# Patient Record
Sex: Female | Born: 1955 | Race: Black or African American | Hispanic: No | Marital: Single | Smoking: Former smoker
Health system: Southern US, Community
[De-identification: ages and names within clinical notes are randomized; demographics above are authoritative.]

## PROBLEM LIST (undated history)

## (undated) DIAGNOSIS — M3500 Sicca syndrome, unspecified: Secondary | ICD-10-CM

## (undated) DIAGNOSIS — T8859XA Other complications of anesthesia, initial encounter: Secondary | ICD-10-CM

## (undated) DIAGNOSIS — K5900 Constipation, unspecified: Secondary | ICD-10-CM

## (undated) DIAGNOSIS — IMO0001 Reserved for inherently not codable concepts without codable children: Secondary | ICD-10-CM

## (undated) DIAGNOSIS — R609 Edema, unspecified: Secondary | ICD-10-CM

## (undated) DIAGNOSIS — R1310 Dysphagia, unspecified: Secondary | ICD-10-CM

## (undated) DIAGNOSIS — M1990 Unspecified osteoarthritis, unspecified site: Secondary | ICD-10-CM

## (undated) DIAGNOSIS — K219 Gastro-esophageal reflux disease without esophagitis: Secondary | ICD-10-CM

## (undated) DIAGNOSIS — E7800 Pure hypercholesterolemia, unspecified: Secondary | ICD-10-CM

## (undated) DIAGNOSIS — E079 Disorder of thyroid, unspecified: Secondary | ICD-10-CM

## (undated) DIAGNOSIS — M549 Dorsalgia, unspecified: Secondary | ICD-10-CM

## (undated) DIAGNOSIS — I10 Essential (primary) hypertension: Secondary | ICD-10-CM

## (undated) DIAGNOSIS — E559 Vitamin D deficiency, unspecified: Secondary | ICD-10-CM

## (undated) DIAGNOSIS — E739 Lactose intolerance, unspecified: Secondary | ICD-10-CM

## (undated) DIAGNOSIS — M109 Gout, unspecified: Secondary | ICD-10-CM

## (undated) DIAGNOSIS — Z91018 Allergy to other foods: Secondary | ICD-10-CM

## (undated) DIAGNOSIS — E119 Type 2 diabetes mellitus without complications: Secondary | ICD-10-CM

## (undated) DIAGNOSIS — M2550 Pain in unspecified joint: Secondary | ICD-10-CM

## (undated) DIAGNOSIS — R519 Headache, unspecified: Secondary | ICD-10-CM

## (undated) DIAGNOSIS — R011 Cardiac murmur, unspecified: Secondary | ICD-10-CM

## (undated) DIAGNOSIS — D649 Anemia, unspecified: Secondary | ICD-10-CM

## (undated) DIAGNOSIS — B009 Herpesviral infection, unspecified: Secondary | ICD-10-CM

## (undated) DIAGNOSIS — R12 Heartburn: Secondary | ICD-10-CM

## (undated) DIAGNOSIS — R0602 Shortness of breath: Secondary | ICD-10-CM

## (undated) HISTORY — DX: Type 2 diabetes mellitus without complications: E11.9

## (undated) HISTORY — DX: Unspecified osteoarthritis, unspecified site: M19.90

## (undated) HISTORY — DX: Dorsalgia, unspecified: M54.9

## (undated) HISTORY — DX: Disorder of thyroid, unspecified: E07.9

## (undated) HISTORY — DX: Heartburn: R12

## (undated) HISTORY — PX: HERNIA REPAIR: SHX51

## (undated) HISTORY — DX: Essential (primary) hypertension: I10

## (undated) HISTORY — DX: Vitamin D deficiency, unspecified: E55.9

## (undated) HISTORY — DX: Dysphagia, unspecified: R13.10

## (undated) HISTORY — PX: TONSILLECTOMY AND ADENOIDECTOMY: SUR1326

## (undated) HISTORY — DX: Reserved for inherently not codable concepts without codable children: IMO0001

## (undated) HISTORY — DX: Edema, unspecified: R60.9

## (undated) HISTORY — PX: TUBAL LIGATION: SHX77

## (undated) HISTORY — DX: Constipation, unspecified: K59.00

## (undated) HISTORY — DX: Lactose intolerance, unspecified: E73.9

## (undated) HISTORY — DX: Shortness of breath: R06.02

## (undated) HISTORY — DX: Sjogren syndrome, unspecified: M35.00

## (undated) HISTORY — DX: Gout, unspecified: M10.9

## (undated) HISTORY — DX: Allergy to other foods: Z91.018

## (undated) HISTORY — DX: Pain in unspecified joint: M25.50

## (undated) HISTORY — DX: Herpesviral infection, unspecified: B00.9

## (undated) HISTORY — PX: PELVIC LAPAROSCOPY: SHX162

## (undated) HISTORY — DX: Anemia, unspecified: D64.9

## (undated) HISTORY — PX: BREAST SURGERY: SHX581

## (undated) HISTORY — PX: BREAST LUMPECTOMY: SHX2

## (undated) HISTORY — PX: NECK SURGERY: SHX720

## (undated) HISTORY — DX: Pure hypercholesterolemia, unspecified: E78.00

## (undated) HISTORY — PX: APPENDECTOMY: SHX54

## (undated) HISTORY — DX: Gastro-esophageal reflux disease without esophagitis: K21.9

## (undated) HISTORY — PX: PILONIDAL CYST EXCISION: SHX744

---

## 1998-08-28 ENCOUNTER — Other Ambulatory Visit: Admission: RE | Admit: 1998-08-28 | Discharge: 1998-08-28 | Payer: Self-pay | Admitting: Gynecology

## 2000-10-20 ENCOUNTER — Encounter: Admission: RE | Admit: 2000-10-20 | Discharge: 2000-10-20 | Payer: Self-pay | Admitting: Internal Medicine

## 2000-10-20 ENCOUNTER — Encounter: Payer: Self-pay | Admitting: Internal Medicine

## 2001-01-05 ENCOUNTER — Ambulatory Visit (HOSPITAL_COMMUNITY): Admission: RE | Admit: 2001-01-05 | Discharge: 2001-01-06 | Payer: Self-pay | Admitting: Neurosurgery

## 2001-02-13 ENCOUNTER — Encounter: Admission: RE | Admit: 2001-02-13 | Discharge: 2001-02-13 | Payer: Self-pay | Admitting: Neurosurgery

## 2002-04-16 ENCOUNTER — Other Ambulatory Visit: Admission: RE | Admit: 2002-04-16 | Discharge: 2002-04-16 | Payer: Self-pay | Admitting: Gynecology

## 2002-10-11 HISTORY — PX: SALPINGOOPHORECTOMY: SHX82

## 2002-10-29 ENCOUNTER — Encounter (INDEPENDENT_AMBULATORY_CARE_PROVIDER_SITE_OTHER): Payer: Self-pay

## 2002-10-29 ENCOUNTER — Observation Stay (HOSPITAL_COMMUNITY): Admission: RE | Admit: 2002-10-29 | Discharge: 2002-10-30 | Payer: Self-pay | Admitting: Gynecology

## 2003-06-25 ENCOUNTER — Other Ambulatory Visit: Admission: RE | Admit: 2003-06-25 | Discharge: 2003-06-25 | Payer: Self-pay | Admitting: Gynecology

## 2004-08-04 ENCOUNTER — Other Ambulatory Visit: Admission: RE | Admit: 2004-08-04 | Discharge: 2004-08-04 | Payer: Self-pay | Admitting: Gynecology

## 2005-01-10 HISTORY — PX: VAGINAL HYSTERECTOMY: SUR661

## 2005-01-11 ENCOUNTER — Encounter (INDEPENDENT_AMBULATORY_CARE_PROVIDER_SITE_OTHER): Payer: Self-pay | Admitting: Specialist

## 2005-01-11 ENCOUNTER — Observation Stay (HOSPITAL_COMMUNITY): Admission: RE | Admit: 2005-01-11 | Discharge: 2005-01-12 | Payer: Self-pay | Admitting: Gynecology

## 2005-01-23 ENCOUNTER — Inpatient Hospital Stay (HOSPITAL_COMMUNITY): Admission: AD | Admit: 2005-01-23 | Discharge: 2005-01-26 | Payer: Self-pay | Admitting: Gynecology

## 2005-04-12 HISTORY — PX: SALPINGOOPHORECTOMY: SHX82

## 2005-04-16 ENCOUNTER — Ambulatory Visit: Payer: Self-pay | Admitting: Infectious Diseases

## 2005-04-16 ENCOUNTER — Inpatient Hospital Stay (HOSPITAL_COMMUNITY): Admission: AD | Admit: 2005-04-16 | Discharge: 2005-04-23 | Payer: Self-pay | Admitting: Gynecology

## 2005-04-18 ENCOUNTER — Encounter: Payer: Self-pay | Admitting: Gynecology

## 2005-05-03 ENCOUNTER — Inpatient Hospital Stay (HOSPITAL_COMMUNITY): Admission: RE | Admit: 2005-05-03 | Discharge: 2005-05-07 | Payer: Self-pay | Admitting: Gynecology

## 2005-05-03 ENCOUNTER — Encounter (INDEPENDENT_AMBULATORY_CARE_PROVIDER_SITE_OTHER): Payer: Self-pay | Admitting: *Deleted

## 2005-08-16 ENCOUNTER — Other Ambulatory Visit: Admission: RE | Admit: 2005-08-16 | Discharge: 2005-08-16 | Payer: Self-pay | Admitting: Gynecology

## 2006-08-17 ENCOUNTER — Other Ambulatory Visit: Admission: RE | Admit: 2006-08-17 | Discharge: 2006-08-17 | Payer: Self-pay | Admitting: Gynecology

## 2007-03-16 ENCOUNTER — Encounter: Admission: RE | Admit: 2007-03-16 | Discharge: 2007-03-27 | Payer: Self-pay | Admitting: Internal Medicine

## 2007-10-12 ENCOUNTER — Other Ambulatory Visit: Admission: RE | Admit: 2007-10-12 | Discharge: 2007-10-12 | Payer: Self-pay | Admitting: Gynecology

## 2008-03-18 ENCOUNTER — Ambulatory Visit: Payer: Self-pay | Admitting: Gynecology

## 2008-07-03 ENCOUNTER — Encounter: Admission: RE | Admit: 2008-07-03 | Discharge: 2008-07-03 | Payer: Self-pay | Admitting: Internal Medicine

## 2008-08-20 ENCOUNTER — Ambulatory Visit: Payer: Self-pay | Admitting: Gynecology

## 2009-03-17 ENCOUNTER — Observation Stay (HOSPITAL_COMMUNITY): Admission: EM | Admit: 2009-03-17 | Discharge: 2009-03-18 | Payer: Self-pay | Admitting: Emergency Medicine

## 2009-04-12 DIAGNOSIS — B009 Herpesviral infection, unspecified: Secondary | ICD-10-CM

## 2009-04-12 HISTORY — DX: Herpesviral infection, unspecified: B00.9

## 2009-05-02 ENCOUNTER — Other Ambulatory Visit: Admission: RE | Admit: 2009-05-02 | Discharge: 2009-05-02 | Payer: Self-pay | Admitting: Gynecology

## 2009-05-02 ENCOUNTER — Ambulatory Visit: Payer: Self-pay | Admitting: Gynecology

## 2009-08-18 ENCOUNTER — Ambulatory Visit: Payer: Self-pay | Admitting: Gynecology

## 2009-09-16 ENCOUNTER — Ambulatory Visit: Payer: Self-pay | Admitting: Gynecology

## 2009-09-19 ENCOUNTER — Ambulatory Visit: Payer: Self-pay | Admitting: Gynecology

## 2010-03-12 HISTORY — PX: BACK SURGERY: SHX140

## 2010-04-20 ENCOUNTER — Ambulatory Visit
Admission: RE | Admit: 2010-04-20 | Discharge: 2010-04-20 | Payer: Self-pay | Source: Home / Self Care | Attending: Gynecology | Admitting: Gynecology

## 2010-05-18 ENCOUNTER — Other Ambulatory Visit: Payer: Self-pay | Admitting: Internal Medicine

## 2010-05-19 ENCOUNTER — Encounter: Payer: Self-pay | Admitting: Internal Medicine

## 2010-07-14 LAB — CARDIAC PANEL(CRET KIN+CKTOT+MB+TROPI)
CK, MB: 0.9 ng/mL (ref 0.3–4.0)
CK, MB: 1 ng/mL (ref 0.3–4.0)
Relative Index: 0.7 (ref 0.0–2.5)
Total CK: 131 U/L (ref 7–177)
Total CK: 146 U/L (ref 7–177)
Troponin I: 0.02 ng/mL (ref 0.00–0.06)

## 2010-07-14 LAB — POCT I-STAT, CHEM 8
Creatinine, Ser: 1 mg/dL (ref 0.4–1.2)
HCT: 43 % (ref 36.0–46.0)
Hemoglobin: 14.6 g/dL (ref 12.0–15.0)
Potassium: 3.9 mEq/L (ref 3.5–5.1)
Sodium: 141 mEq/L (ref 135–145)

## 2010-07-14 LAB — CBC
MCHC: 33.2 g/dL (ref 30.0–36.0)
MCV: 81.1 fL (ref 78.0–100.0)
MCV: 81.2 fL (ref 78.0–100.0)
Platelets: 222 10*3/uL (ref 150–400)
RDW: 14.4 % (ref 11.5–15.5)
RDW: 14.6 % (ref 11.5–15.5)
WBC: 5.4 10*3/uL (ref 4.0–10.5)

## 2010-07-14 LAB — POCT CARDIAC MARKERS
CKMB, poc: 1.3 ng/mL (ref 1.0–8.0)
Myoglobin, poc: 91.1 ng/mL (ref 12–200)

## 2010-07-14 LAB — DIFFERENTIAL
Basophils Absolute: 0 10*3/uL (ref 0.0–0.1)
Basophils Relative: 0 % (ref 0–1)
Eosinophils Absolute: 0.1 10*3/uL (ref 0.0–0.7)
Lymphs Abs: 1.8 10*3/uL (ref 0.7–4.0)
Neutrophils Relative %: 57 % (ref 43–77)

## 2010-07-14 LAB — MAGNESIUM: Magnesium: 1.9 mg/dL (ref 1.5–2.5)

## 2010-07-14 LAB — PHOSPHORUS: Phosphorus: 4 mg/dL (ref 2.3–4.6)

## 2010-07-14 LAB — BASIC METABOLIC PANEL
Calcium: 8.7 mg/dL (ref 8.4–10.5)
Creatinine, Ser: 0.89 mg/dL (ref 0.4–1.2)
GFR calc Af Amer: >60 mL/min (ref >60)
GFR calc non Af Amer: >60 mL/min (ref >60)
Sodium: 142 mEq/L (ref 135–145)

## 2010-08-28 NOTE — H&P (Signed)
NAME:  [name], [name]                  ACCOUNT NO.:  [id number]   MEDICAL RECORD NO.:  [id number]                   PATIENT TYPE:  AMB   LOCATION:  SDC                                  FACILITY:  [facility]   PHYSICIAN:  Timothy P. Fontaine, M.D.           DATE OF BIRTH:  12-25-1955   DATE OF ADMISSION:  10/29/2002  DATE OF DISCHARGE:                                HISTORY & PHYSICAL   CHIEF COMPLAINT:  Left-sided pain.   HISTORY OF PRESENT ILLNESS:  A 56 year old, G5, P2, AB3, female with a long  history of recurrent left pelvic pain.  The patient is status post  laparoscopy x2 for similar pain, and on ultrasound she is found to have a  persistent cystic change, questionable hydrosalpinx versus persistent  ovarian cyst.  Her CA-125 was normal.  Options for expectant management  versus surgical intervention were reviewed, and the patient desires surgery.  The patient is status post tubal sterilization in the past.  The patient  plans for left salpingo-oophorectomy.   PAST MEDICAL HISTORY:  None.   PAST SURGICAL HISTORY:  1. Groin hernia repair.  2. Tonsillectomy.  3. Breast biopsy.  4. Tubal sterilization.  5. Laparoscopy x2.   MEDICATIONS:  1. Zyrtec.  2. Nasacort.  3. Albuterol p.r.n.   ALLERGIES:  1. PENICILLIN.  2. CODEINE.   FAMILY HISTORY:  Noncontributory.   SOCIAL HISTORY:  Noncontributory.   REVIEW OF SYSTEMS:  Noncontributory.   ADMISSION PHYSICAL EXAMINATION:  VITAL SIGNS:  Afebrile.  Vital signs are  stable.  HEENT:  Normal.  LUNGS:  Clear.  CARDIAC:  Regular rate with rubs, murmurs, or gallops.  ABDOMEN:  Benign.  PELVIC:  External BUS, vagina normal.  Cervix grossly normal.  Uterus normal  size, nontender.  Adnexa without masses or tenderness.   ASSESSMENT:  A 55 year old, G5, P2, AB3, female status post tubal  sterilization with recurrent chronic left-sided pain.  Ultrasound suggestive  of a persistent ovarian cyst versus hydrosalpinx.  The  patient is admitted  for laparoscopic left salpingo-oophorectomy.  I reviewed with the patient  the various scenarios.  I discussed with her that if at the time of  laparoscopy what if her left side appears normal or that she has a left  hydrosalpinx and her ovary appears normal, and the patient stated that she  wants the left tube and ovary removed regardless, even if it appears normal.  We also discussed that there is no guarantee as far as pain relief is  concerned, that she may still have pain following the procedure.  She may  develop new pain or worsening pain, and again she understands and accepts  this and wants to proceed with surgery.  I also discussed with her that if  there is scarring or pathology that I am not comfortable laparoscopically  pursuing, the options for terminating the procedure then or proceeding with  an exploratory laparotomy was discussed, and she wants the exploratory  laparotomy to remove the left side.  We also discussed the issues of  hysterectomy, bilateral salpingo-oophorectomy, and she prefers the  conservative left salpingo-oophorectomy approach now, understanding that  again there are no guarantees, and we may ultimately have to proceed with  hysterectomy, bilateral salpingo-oophorectomy in the future if her pain  persists or recurs.  I discussed what is involved with the procedure to  include the expected intraoperative and postoperative courses including  insufflation, trocar placement, multiple port sites, and large incisions to  remove cystic ovary or hydrosalpinx.  The use of sharp blunt dissection,  electrocautery, staples, and surgical clips were all discussed with her.  The risk of inadvertent injury to internal orders including bowel, bladder,  ureters, vessels and nerves necessitating major exploratory reparative  surgeries and future reparative surgeries including ostomy formation was all  discussed, understood, and accepted.  She  understands that these  complications may be immediately recognized or delay recognized.  The risks  of hemorrhage necessitating transfusion, and the risk of transfusion  including transfusion reaction, hepatitis, HIV, mad cow disease, and other  unknown entities were discussed with her.  The risks of infection leading to  abscess formation, reoperation for abscess drainage, as well as prolonged  antibiotics were discussed with her, and incisional complications requiring  opening and draining of incisions were also reviewed with her.  The  patient's questions were answered to her satisfaction, and she was ready to  proceed with surgery.                                               Timothy P. Audie Box, M.D.    TPF/MEDQ  D:  10/28/2002  T:  10/28/2002  Job:  [id number]

## 2010-08-28 NOTE — H&P (Signed)
[name], [name]                ACCOUNT NO.:  [id number]   MEDICAL RECORD NO.:  [id number]          PATIENT TYPE:  INP   LOCATION:  NA                           FACILITY:  [facility]   PHYSICIAN:  Timothy P. Fontaine, M.D.DATE OF BIRTH:  1956-01-29   DATE OF ADMISSION:  05/03/2005  DATE OF DISCHARGE:                                HISTORY & PHYSICAL   CHIEF COMPLAINT:  Pelvic mass.   HISTORY OF PRESENT ILLNESS:  A 55 year old female status post TVH,  posterior colporrhaphy January 11, 2005. The patient's postoperative course  was complicated by a presumptive postoperative cuff cellulitis, for which  she was admitted January 23, 2005 through January 26, 2005 and received IV  antibiotics. The patient presented with fevers and pelvic pain. She had an  ultrasound which showed no pelvic masses, accumulations, hematomas, and  bilateral ureters were normal with bilateral ureteral jets in the bladder.  She did well postoperatively, had a normal postoperative follow up exam  until proximally 2 weeks prior to this admission, at which time she  developed increasing fevers at home to 102-103 and pelvic pain, pelvic  pressure. Evaluation at that time found a solid cystic mass at the cuff  measuring 57 x 48 x 50 mm, continuous with the right ovary. She also had  right ureteral compression with right-sided hydronephrosis. She was admitted  at that time. Differential to include a hemorrhagic ovarian process,  compression of the ureter, possible upper tract urinary infection versus  TOA. The patient defervesced with IV antibiotics, was switched to oral  Bactrim, remained afebrile, and is readmitted at this time for exploratory  laparotomy, and excision of her pelvic cystic mass to include right salpingo-  oophorectomy. The patient also will have a ureteral stent placed on the  right to assist during the procedure.   PAST MEDICAL HISTORY:  1.  Asthma.  2.  Reflux.   CURRENT MEDICATIONS:  1.   Prilosec.  2.  Zyrtec.  3.  Albuterol inhaler p.r.n.  4.  Advair inhaler p.r.n.  5.  Bactrim DS one p.o. b.i.d.   ALLERGIES:  Allergies are significant to include -  1.  PENICILLIN and PENICILLIN DERIVATIVES.  2.  'MYCIN ANTIBIOTICS.  3.  CODEINE.  4.  HYDROCODONE DERIVATIVES.  5.  IODINE.  6.  SHELLFISH.  7.  PEANUTS.  8.  LATEX.   PAST SURGICAL HISTORY:  1.  Vaginal hysterectomy, posterior repair.  2.  Separate left salpingo-oophorectomy for serous cystadenoma.  3.  Tonsillectomy and adenoidectomy.  4.  Breast biopsy.  5.  Tubal sterilization.  6.  Laparoscopy x2.  7.  Pilonidal cyst.   FAMILY HISTORY:  Noncontributory.   SOCIAL HISTORY:  Noncontributory.   PHYSICAL EXAMINATION:  VITAL SIGNS:  Afebrile. Vital signs are stable.  HEENT:  Normal.  LUNGS:  Clear.  CARDIAC:  Regular rate without rubs, murmurs or gallops.  ABDOMEN:  Benign, noting lower abdominopelvic discomfort to deep palpation.  No acute changes such as rebound, guarding.  PELVIC:  External Bartholin's urethra, Skene's glands, vagina normal.  Bimanual palpable mass at the cuff approximately  7 cm, mildly tender.   ASSESSMENT/PLAN:  A 55 year old female status post vaginal hysterectomy and  posterior repair, cuff cellulitis, treated.  Apparently did well until 2  weeks prior to admission with onset of fevers, pelvic pain and pressure.  Ultrasound, MRI consistent with a cystic complex mass at the cuff continuous  with the right ovary, right hydronephrosis due to extrinsic compression.  Differential includes a tubo-ovarian abscess, hemorrhagic ovarian process  with extrinsic ureteral compression, upper urinary tract infection. The  patient is admitted for exploratory laparotomy, right salpingo-oophorectomy,  excision of mass. Risks, benefits, indications and alternatives for the  procedure were reviewed with the patient to include the expected  intraoperative and postoperative courses. She understands that  Dr.  Patsi Sears and I will perform the surgery and it may involve other  consultants such as general surgery, if needed intraoperatively. Ureteral  stent will tentatively be placed to assist with the surgery and probably  will be left in place to very removed at some point postoperatively.  Realistic issues were reviewed with her.  If this is indeed a tubo-ovarian  abscess, the potential for significant adhesive disease involving the bowel  and pelvic sidewall were discussed. She wants to have her right ovary  removed, but she understands that if there is intense inflammatory reaction  that we may not be able to remove all of her ovary, and that she may have an  ovarian remnant syndrome were portions of the ovary remain, which could  remain functional, such that she would have issues in the future with  continued hormone production, as well as a potential for problems such as  both benign and malignant ovarian disease. She understands that we will  attempt at our best effort to remove all the ovary, but, again, this may not  be possible. She also understands realistically that the bowels may be  inflamed and involved in this adhesive area and that realistically we may  enter the intestines both small and large, requiring closure of the  intestines and/or the potential for colostomy or other ostomy formations  that she would have to undergo further surgeries to complete repairs in the  future. The risk of infection following the procedure was reviewed. She will  be on prophylactic antibiotics, but she may need to continue on these  following the procedure. Wound complications following the procedure were  discussed, and the possibility of open and drainage of wound seromas,  hematomas, or abscess formations and closure by secondary intention was  discussed, understood and accepted. The risks of bleeding and hemorrhage during the procedure were reviewed, and the potential for transfusion   discussed.  The risks of transfusion reaction, hepatitis, HIV, mad cow  disease and other unknown entities were all reviewed, understood and  accepted. The risks of injury to internal organs including bowel, bladder,  ureters, vessels and nerves necessitating major exploratory reparative  surgeries, future reparative surgeries to include ostomies as previously  mentioned were all discussed, understood and accepted. The patient's  questions were answered to her satisfaction, and she is ready to proceed  with surgery. She will be on a bowel prep, receive prophylactic antibiotics  and intermittent compression hose for DVT prophylaxis.      Timothy P. Fontaine, M.D.  Electronically Signed     TPF/MEDQ  D:  04/29/2005  T:  04/29/2005  Job:  [id number]

## 2010-08-28 NOTE — Discharge Summary (Signed)
NAMESRUTHI, MAURER                ACCOUNT NO.:  [id number]   MEDICAL RECORD NO.:  [id number]          PATIENT TYPE:  INP   LOCATION:  9306                          FACILITY:  [facility]   PHYSICIAN:  Timothy P. Fontaine, M.D.DATE OF BIRTH:  [birth year]   DATE OF ADMISSION:  01/23/2005  DATE OF DISCHARGE:  01/26/2005                                 DISCHARGE SUMMARY   DISCHARGE DIAGNOSIS:  Postoperative cuff cellulitis.   PROCEDURE:  None.   HOSPITAL COURSE:  A 55 year old female status post TVH one week prior to  admission with increasing lower abdominal discomfort, nausea and fever.  Admission physical exam showed a temperature of 101, subsequently rose to  103 with a physical exam showing pelvic tenderness.  No palpable masses and  no other abnormalities on physical exam.  The patient had an ultrasound as  an outpatient, pelvic, which showed no cuff hematoma or accumulation and  upon admission to the hospital had a renal ultrasound to rule out  hydronephrosis, and both the kidneys and ureters were visualized and were  normal, as well as seeing ureteral jets bilaterally into the urinary  bladder.  The patient was begun on IV antibiotics to include gentamicin and  clindamycin and defervesced quickly.  At the time of discharge she was  ambulating well, tolerating a regular diet.  She was afebrile greater than  24 hours and was given a prescription for clindamycin 300 mg q.i.d. to  complete a seven-day course.  She has a number allergies to include  PENICILLIN, which dictated her IV antibiotic choice.  She was also given a  prescription for Demerol 50 mg #20 one p.o. q.4-6h. p.r.n. pain.  She  received precautions for ASAP precautions and will be seen in the office the  week following discharge.      Timothy P. Fontaine, M.D.  Electronically Signed     TPF/MEDQ  D:  02/16/2005  T:  02/16/2005  Job:  [id number]

## 2010-08-28 NOTE — Op Note (Signed)
NAMEVINCENZA, [name]                ACCOUNT NO.:  [id number]   MEDICAL RECORD NO.:  [id number]          PATIENT TYPE:  INP   LOCATION:  1426                         FACILITY:  [facility]   PHYSICIAN:  Timothy P. Fontaine, M.D.DATE OF BIRTH:  [birth year]   DATE OF PROCEDURE:  05/03/2005  DATE OF DISCHARGE:                                 OPERATIVE REPORT   PREOPERATIVE DIAGNOSES:  Right hydronephrosis, pelvic mass.   POSTOPERATIVE DIAGNOSES:  1.  Right hydronephrosis.  2.  Right tubal ovarian abscess.  3.  Appendiceal inflammation.   PROCEDURE:  Cystoscopy, right ureteral stent placement, exploratory  laparotomy, lysis of adhesions, right salpingo-oophorectomy, TOA excision,  appendectomy.   SURGEON:  Timothy P. Fontaine, M.D.   ASSISTANTLynelle Smoke I. Patsi Sears, M.D.   ANESTHETIC:  General.   COMPLICATIONS:  None.   SPECIMEN:  Right TOA, appendix.   ESTIMATED BLOOD LOSS:  200-300 mL.   FINDINGS:  Right 7 cm cystic mass with pus extruding consistent with TOA  removed, appendix adherent to the mass, loops of small intestine adherent to  the mass.   DESCRIPTION OF PROCEDURE:  The patient was taken to the operating room,  properly identified, underwent general anesthesia and was placed in the low  dorsal lithotomy position. She received a perineal preparation with Betadine  solution draped in the usual fashion and underwent cystoscopy per Dr.  Patsi Sears with a right ureteral stent placement without difficulty per his  separate dictation. Subsequently, the Foley catheter was placed, the patient  placed in the supine position, received an abdominal preparation with  Betadine solution and was draped in the usual fashion. The abdomen was  sharply entered through a midline lower abdominal incision without  difficulty. The O'Connor-O'Sullivan retractor was placed, bladder blade  placed and the intestines were packed from the operative site. A cystic  right pelvic sidewall mass was  encountered consistent with the TOA. The  appendix was adherent across the anterior surface of this area and this was  sharply bluntly dissected free. Subsequently several loops of small  intestine were similarly sharply and bluntly dissected free of the mass  without evidence of perforation. The mass was freed from the pelvic sidewall  through blunt dissection and a small area of pus extruded which was cultured  and sent to microbiology, both aerobic and anaerobic cultures. The right  round ligament was identified, doubly ligated and transected and the  peritoneal sidewall reflection was sharply incised. Due to the intense  inflammation, the infundibulopelvic region was identified and through  progressive right-angle tunneling and subsequent ligation transection, the  infundibulopelvic ligament and vessels were transected and ligated in  several passes. The ureter was away from this site. The tubo-ovarian abscess  was ultimately delivered and excised and of note, the abscess was not  ruptured during this process. The pelvis was copiously irrigated. Several  small bleeding points addressed with electrocautery. Ultimately Surgicel was  placed into the cul-de-sac to achieve ultimate hemostasis. Dr. Zachery Dakins was  summoned to the operating room for his assessment of the appendix as to  whether we should remove this  and under his direction it was recommended to  go ahead and perform an appendectomy due to the inflammatory nature of the  appendix. The mesoappendix was ligated using #0 Vicryl suture and  subsequently using a TA-30 stapling device. The appendix was stapled at the  intersection with the cecum and the appendix was excised. The stump was  noted to be intact. The bowel packing was then removed, the retractors  removed. Instrument count correct, sponge count correct and the anterior  fascia was reapproximated using #0 PDS in a running suture. Subcutaneous  tissues were irrigated and  the skin was subsequently reapproximated with  staples. A sterile dressing was applied, the patient awakened without  difficulty and taken to the recovery in good condition having tolerated the  procedure well.      Timothy P. Fontaine, M.D.  Electronically Signed     TPF/MEDQ  D:  05/03/2005  T:  05/04/2005  Job:  [id number]

## 2010-08-28 NOTE — Discharge Summary (Signed)
NAME:  [name], [name]                ACCOUNT NO.:  [id number]   MEDICAL RECORD NO.:  [id number]          PATIENT TYPE:  OBV   LOCATION:  9306                          FACILITY:  [facility]   PHYSICIAN:  Timothy P. Fontaine, M.D.DATE OF BIRTH:  1955/11/07   DATE OF ADMISSION:  01/11/2005  DATE OF DISCHARGE:                                 DISCHARGE SUMMARY   DISCHARGE DIAGNOSES:  1.  Uterine prolapse.  2.  Rectocele.  3.  Leiomyomata.   PROCEDURE:  Total vaginal hysterectomy, posterior colporrhaphy, McCall's  culdoplasty - January 11, 2005; pathology pending.   HOSPITAL COURSE:  A 55 year old female with symptomatic uterine prolapse,  posterior rectocele, underwent total vaginal hysterectomy, posterior  colporrhaphy, McCall's culdoplasty on January 11, 2005 without complications.  The patient was discharged the first morning postoperative, ambulating well,  tolerating a regular diet, with a postoperative hemoglobin of 12.2. The  patient received precautions, instructions, and follow-up. Prescription for  Demerol 50 mg #20 one p.o. q.4-6h. p.r.n. pain, and will be seen in the  office 2 weeks following discharge.      Timothy P. Fontaine, M.D.  Electronically Signed     TPF/MEDQ  D:  01/12/2005  T:  01/12/2005  Job:  [id number]

## 2010-08-28 NOTE — Discharge Summary (Signed)
[name], PARZYCH                ACCOUNT NO.:  [id number]   MEDICAL RECORD NO.:  [id number]          PATIENT TYPE:  INP   LOCATION:  9317                          FACILITY:  [facility]   PHYSICIAN:  Timothy P. Fontaine, M.D.DATE OF BIRTH:  02/14/1956   DATE OF ADMISSION:  04/16/2005  DATE OF DISCHARGE:  04/23/2005                                 DISCHARGE SUMMARY   DISCHARGE DIAGNOSES:  1.  Tubo-ovarian abscess.  2.  Right hydronephrosis.   PROCEDURE:  None.   HOSPITAL COURSE:  A 54 year old female status post total vaginal  hysterectomy, posterior repair in October. The patient initially had a  febrile episode diagnosed with cuff cellulitis, received IV antibiotics with  resolution of her symptoms. She subsequently was for postoperative check and  doing well. Of note during that evaluation, she had a negative pelvic  ultrasound and normal-appearing renal collecting systems with bilateral  ureteral jets noted on ultrasound. She did well until about a week prior to  admission at one point she had increasing pelvic pressure and pain, right-  sided back pain and spiking fevers to 102 to 103. Initial evaluation  revealed temperature 101, tenderness in the lower abdomen with right CVA  tenderness and vessel cuff tender to palpation. She had an elevated white  count at 20,000 and a hemoglobin of 11.4. Evaluations included ultrasound  and MRI, the reports of which are not in the chart at the time of this  dictation but which showed a pelvic mass, complex approximately 9 cm,  contiguous with the right ovary, consistent with tubo-ovarian process and  right hydronephrosis, although there is some decompression post void noted.  Working diagnosis was tubo-ovarian abscess with compression of right ureter  versus hemorrhagic benign process of the right ovary compression of the  ureter with pyelonephritis.   The patient's has a number of allergies and she underwent an infectious  disease consult  for assistance in selection of antibiotics as well as a  urologic consultation in reference to her right hydronephrosis. The patient  was placed on vancomycin 1500 milligrams q.12h. and aztreonam  1 gram q.8h.  She defervesced and clinically improved over her hospitalization. The follow-  up white count was 9.8. Blood cultures and urine cultures obtained at the  time of admission were negative in follow-up. The patient remained afebrile.  Greater than 48 hours prior to discharge was changed to Bactrim DS for oral  antibiotics per infectious disease recommendation and the plan is to  continue on oral antibiotics for 1-2 weeks and subsequently then undergo  surgical exploration for removal of the mass as well as a preoperative  ureteral stent placement to aid in surgical dissection. The patient received  precautions, instructions and follow-up. She was to follow up within the  week of discharge in the office. ASAP call precautions stressed. She was  given a prescription for Bactrim DS one p.o. b.i.d., Demerol 50 milligrams  #30 one to two p.o. q.4-6h. p.r.n. pain and Reglan 10 milligrams #30 one  p.o. q.6h. p.r.n. nausea.      Timothy P. Fontaine, M.D.  Electronically  Signed     TPF/MEDQ  D:  05/08/2005  T:  05/08/2005  Job:  [id number]

## 2010-08-28 NOTE — Consult Note (Signed)
[name], [name]                ACCOUNT NO.:  [id number]   MEDICAL RECORD NO.:  [id number]          PATIENT TYPE:  INP   LOCATION:  9317                          FACILITY:  [facility]   PHYSICIAN:  Sigmund I. Patsi Sears, M.D.DATE OF BIRTH:  1955/10/22   DATE OF CONSULTATION:  04/19/2005  DATE OF DISCHARGE:                                   CONSULTATION   SUBJECTIVE:  The patient is a 55 year old female, 2-1/2 months status post  vaginal hysterectomy, now found to have a 10cm multiloculated fluid mass (?  tubo-ovarian abscess) on MRI.  She is currently being treated with IV  antibiotics.  The patient is noted to have mild to moderate right  hydronephrosis, coincidental with flank pain.  ROS> No flankpain, no gross hematuria. Remaining ROS non-contributory.   PAST MEDICAL HISTORY:  1.  Transvaginal hysterectomy with posterior repair in October 2006.  2.  Asthma.  3.  GERD.  <Family History> Non-Contributory  <Social History> married. not working.   MEDICATIONS:  1.  Prilosec one p.o. per day.  2.  Zyrtec one p.o. per day.  3.  Albuterol inhaler.  4.  Advair inhaler.   ALLERGIES:  1.  PENICILLIN (swelling and rash and hives).  2.  MYCINS.  3.  CODEINE.  4.  IODINE.  5.  SHELLFISH.  6.  PEANUTS.  7.  LATEX.   PHYSICAL EXAMINATION:  GENERAL:  Well-developed black female in no acute  distress.  VITAL SIGNS:  Blood pressure 97/61, temperature fluctuating between 98.6 and  100.5.  Pulse 80.  NECK: Supple, non-tender, no nodes.  CHEST: clear to P and A.  ABDOMEN:  Soft and scaphoid.  There is distention in the suprapubic area.  There is a massive egg in the suprapubic area. There is tenderness to  palpation and the area is hot to palpation.  There is pain in both the left  lower quadrant and the right lower quadrant.  EXTRIMITIES: Warm. Full pulses. No edema.   MRI review shows a incidental right mid, mild to moderate, hydronephrosis  and the ureter seems to be on the periphery  of the 10 cm multiloculated  cystic mass, which is located in the midline suprapubic area.   I have discussed the case with Dr. Audie Box and also with the patient.  I  agree that she will need to have a double-J catheter placement, and will  proceed with this when she has her exploratory surgery.   Will follow with you.      Sigmund I. Patsi Sears, M.D.  Electronically Signed     SIT/MEDQ  D:  04/19/2005  T:  04/20/2005  Job:  [id number]   cc:   Marcial Pacas P. Fontaine, M.D.  Fax: [phone]

## 2010-08-28 NOTE — H&P (Signed)
[place]. [facility]  Patient:    [name], CREDIT Visit Number: [id number] MRN: [id number]          Service Type: SUR Location: [facility] 3172 07 Attending Physician:  Cristi Loron Dictated by:   Cristi Loron, M.D. Admit Date:  01/05/2001                           History and Physical  CHIEF COMPLAINT:  Neck pain and right shoulder and arm pain.  HISTORY OF PRESENT ILLNESS:  The patient is a 55 year old black female who has had at least one year of neck and right shoulder and arm pain.  She failed medical management.  She was worked up with cervical x-rays and a cervical MRI.  She was kindly sent for my consultation.  The patient complains of posterior cervical pain at the cervical ______ junction, radiating into the right trapezius, right should, and down the right arm.  She has numbness and tingling in the C6 distribution on the right.  PAST MEDICAL HISTORY:  Positive for benign breast cyst, pilonidal cyst, inguinal hernias, and remote history of tonsillitis.  PAST SURGICAL HISTORY:  Left breast lumpectomy, pilonidal cyst removal, bilateral herniorrhaphy, tubal ligation, and tonsillectomy.  MEDICATIONS PRIOR TO ADMISSION:  Allegra p.r.n. and Advil.  DRUG ALLERGIES:  PENICILLIN, TEQUIN, and DURATUSS.  FAMILY MEDICAL HISTORY:  The patients mother is age 54 and she has rheumatoid arthritis.  The patients father is age 64 with heart disease.  SOCIAL HISTORY:  The patient is married.  She has two children.  [place]  She denies tobacco, ethanol, and drug use.  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  REVIEW OF SYSTEMS:  Negative, except as above.  PHYSICAL EXAMINATION:  GENERAL APPEARANCE:  A pleasant, well-nourished, well-developed, black female in no apparent distress.  HEIGHT:  5 feet 7 inches.  WEIGHT:  198 pounds.  HEENT:  Normal.  NECK:  Supple.  There are no masses,  deformity, tracheal deviation, jugular venous distention, or carotid bruits.  She has a mildly limited cervical range of motion.  Spurling test is positive on the right and negative on the left. Lhermittes sign was not present.  THORAX:  Symmetric.  LUNGS:  Clear to auscultation.  HEART:  Regular rate and rhythm.  ABDOMEN:  Soft and nontender.  EXTREMITIES:  No obvious deformities.  BACK:  Exam was normal.  NEUROLOGIC:  The patient is alert and oriented x 3.  Cranial nerves II-XII are grossly intact bilaterally.  Vision and hearing are grossly normal bilaterally.  Motor strength is 5/5 in the bilateral deltoids, biceps, wrist extensors, psoas, quadriceps, gastrocnemius, extensor hallucis longus, left triceps, and hand grip.  She has decreased strength in her right triceps, hand grip, and forceps at 4+/5.  The cerebellar exam is intact to rapid alternating movements of the upper extremities bilaterally.  The sensory exam demonstrates decreased sensation in the right thumb and index and middle fingers, otherwise unremarkable.  The deep tendon reflexes are 2/4 in bilateral biceps, triceps, brachial radialis, quadriceps, and gastrocnemius.  She has bilateral flexor plantar reflexes.  IMAGING STUDIES:  The patient had a cervical MRI performed on October 20, 2000, which demonstrated a straight cervical spine.  She has degenerative disease at C5-6 and C6-7 with a broad based bone spur at C5-6 with mild spinal stenosis at C6-7.  She has right spondylosis and a possible right  herniated disk compressing the right C7 nerve root.  ASSESSMENT AND PLAN:  C5-6 and C6-7 degenerative disk disease with spondylosis, spinal stenosis, herniated nucleus pulposus, cervicalgia, and cervical radiculopathy.  I discussed the situation with the patient.  I reviewed the MRI scan with her.  I discussed the various treatment options with her, including doing nothing, continuing medical management, and  surgery. I prescribed a procedure of a C5-6 and C6-7 anterior cervical diskectomy, interbody iliac crest allograft arthrodesis, and anterior cervical plating.  I have described the surgery to her.  She understood.  I also discussed the risks of surgery extensively.  The patient has weighed the risks, benefits, and alternatives of surgery and wants to proceed with C5-6 and C6-7 cervical diskectomy, interbody iliac crest allograft arthrodesis, and anterior cervical plating on January 05, 2001. Dictated by:   Cristi Loron, M.D. Attending Physician:  Tressie Stalker D DD:  01/05/01 TD:  01/05/01 Job: [id number]

## 2010-08-28 NOTE — Op Note (Signed)
[name], [name]                ACCOUNT NO.:  [id number]   MEDICAL RECORD NO.:  [id number]          PATIENT TYPE:  OBV   LOCATION:  9399                          [facility]ACILITY:  [facility]   PHYSICIAN:  Timothy P. Fontaine, M.D.DATE OF BIRTH:  [birth year]   DATE OF PROCEDURE:  01/11/2005  DATE OF DISCHARGE:                                 OPERATIVE REPORT   PREOPERATIVE DIAGNOSES:  1.  Uterine prolapse.  2.  Rectocele.   POSTOPERATIVE DIAGNOSES:  1.  Uterine prolapse.  2.  Leiomyomata.  3.  Rectocele.   PROCEDURE:  Total vaginal hysterectomy, posterior colporrhaphy, McCall  culdoplasty.   SURGEON:  Timothy P. Fontaine, M.D.   ASSISTANT:  Rande Brunt. Eda Paschal, M.D.   ANESTHETIC:  General.   ESTIMATED BLOOD LOSS:  Less than 100 mL.   COMPLICATIONS:  None.   SPECIMEN:  Uterus.   FINDINGS:  EUA:  Uterus grossly normal in size.  Adnexa without palpable  masses.  Second degree uterine prolapse with rectocele noted.  No  significant cystocele.  Intraoperative:  Uterus grossly normal in size with  a small leiomyomata noted.  Right ovary grossly normal in appearance, free  and mobile without adhesions  Falope ring and noted, left in situ.   PROCEDURE:  The patient was taken to the operating room, underwent general  anesthesia, was placed in the dorsal lithotomy position, received a perineal  and vaginal preparation with Betadine solution per nursing personnel.  The  bladder was emptied with in-and-out Foley catheterization.  The patient was  draped in the usual fashion.  Cervix visualized with a weighted speculum and  anterior lip grasped with a single-tooth tenaculum.  The cervix was then  injected submucosally circumferentially using lidocaine/epinephrine mixture,  a total of 8 mL.  The cervical mucosa was then sharply incised and the  paracervical plane sharply developed.  The posterior cul-de-sac was sharply  entered, a long weighted speculum placed, the right uterosacral ligament  identified, clamped, cut and ligated using 0 Vicryl suture and tagged for  future reference.  A similar procedure was carried out on the other side.  The anterior vesicouterine plane was sharply developed and subsequently  entered without difficulty, and the uterus was then progressively freed from  its attachments through clamping, cutting and ligating of the cardinal  ligaments, parametrial tissues using 0 Vicryl suture.  The uterus was then  delivered through the vagina.  The uterine-ovarian pedicle on the right was identified, doubly clamped and  cut, and subsequently the left uterine sidewall pedicle was likewise doubly  clamped and cut, delivering the uterus from the patient.  The uterine-ovarian pedicle was then doubly ligated using 0 Vicryl suture  first in a simple stitch, followed by suture ligature.  The left pedicle was  likewise doubly ligated using 0 Vicryl suture.  The pelvis was copiously  irrigated.  Adequate hemostasis was visualized.  The bowel was packed using  a tagged tail sponge.  A short weighted speculum replaced the long weighted  speculum posteriorly and the posterior vaginal cuff was run from uterosacral  ligament to uterosacral ligament using  0 Vicryl suture in a running  interlocking stitch.  A McCall culdoplasty stitch using 2-0 Vicryl suture  was then placed without difficulty and tagged for future use.  The tail  sponge was then removed and all pedicles were expected showing adequate  hemostasis.  The vagina was then closed anterior to posterior using 0 Vicryl  suture in interrupted figure-of-eight stitch.  The McCall culdoplasty stitch  was then tied without difficulty and adequate hemostasis of the cuff was  visualized.  Attention was then turned to the posterior repair.  The  posterior fourchette was then grasped with Allis clamps and transversely  incised.  The vaginal mucosa was then sharply incised from caudal to cephalad using  blunt dissection to  undermine the mucosa and then sharply incising and  identifying mucosa as incised with sequential Allis clamps.  The perirectal  space was then developed bilaterally using sharp and blunt dissection and  subsequently the rectocele was reduced using interrupted 2-0 Vicryl sutures  reapproximating the perirectal fascia.  The excessive vaginal mucosa was  excised.  A digital rectal exam was performed to assure no sutures within  the rectum and assure rectal integrity and subsequently after regloving, the  vaginal mucosa was reapproximated using 2-0 Vicryl in a running interlocking  suture.  A Foley catheter was placed.  Free-flowing clear yellow urine was  noted.  Vaginal packing with Cleocin cream was placed.  The patient awakened  without difficulty after being placed in the supine position, was taken to  the recovery room in good condition, having tolerated procedure well.      Timothy P. Fontaine, M.D.  Electronically Signed     TPF/MEDQ  D:  01/11/2005  T:  01/11/2005  Job:  [id number]

## 2010-08-28 NOTE — Op Note (Signed)
[place]. [facility]  Patient:    [name], [name] Visit Number: [id number] MRN: [id number]          Service Type: SUR Location: 3000 3029 01 Attending Physician:  Cristi Loron Dictated by:   Cristi Loron, M.D. Proc. Date: 01/05/01 Admit Date:  01/05/2001                             Operative Report  HISTORY OF PRESENT ILLNESS:  The patient is a 55 year old black female who suffers from neck and arm pain.  She failed medical management.  She was worked up with cervical MRI which demonstrated degenerative disc disease, spinal stenosis at C5-6 and C6-7.  She therefore weighed the risks, benefits, and alternatives to surgery and decided we should proceed with a C5-6 and C6-7 anterior cervical diskectomy and fusion and plating.  PREOPERATIVE DIAGNOSIS:  C5-6 and C6-7 degenerative disc disease, spondylosis, spinal stenosis, cervitalgia, cervical radiculopathy.  POSTOPERATIVE DIAGNOSIS:  C5-6 and C6-7 degenerative disc disease, spondylosis, spinal stenosis, cervitalgia, cervical radiculopathy.  PROCEDURE:  C5-6 and C6-7 extensive anterior cervical diskectomy, interbody iliac crest allograft with arthrodesis, anterior cervical plating (Codman titanium plate and screws).  SURGEON:  Cristi Loron, M.D.  ASSISTANT:  Stefani Dama, M.D.  ANESTHESIA:  General endotracheal.  ESTIMATED BLOOD LOSS:  150 cc.  SPECIMENS:  None.  DRAINS:  None.  COMPLICATIONS:  None.  DESCRIPTION OF PROCEDURE:  The patient was brought to the operating room by the anesthesia team.  General endotracheal anesthesia was induced.  The patient remained in the supine position.  A roll was placed under the shoulder to place her neck in slight extension.  Her anterior cervical region was then prepared with Betadine, and scrubbed with Betadine solution.  Sterile drapes were applied.  I then injected the anterior medial sides with Marcaine with epinephrine solution,  and used the scalpel to make a transverse incision in the patients left anterior neck.  I used the Metzenbaum scissors to divide the platysma muscle and dissect medical to the sternocleidomastoid muscle, jugular vein, and carotid artery.  I bluntly dissected down to the anterior cervical spine, and carefully retracted the esophagus medially.  I cleared the soft tissue from the anterior cervical spine using the Kidner swabs, and inserted a spinal ______ at the upper exposed interspace.  I obtained intraoperative radiograph to confirm my location.  I used electrocautery to detach the medial border of the longus coli muscle bilaterally from the C5-6 and C6-7 anterior vertebral disk space.  I then inserted the ______ self-retaining retractor for exposure, and then used the 15 blade scalpel to incise the C5-6 intervertebral disk.  I performed partial diskectomy with the pituitary forceps and Kong curettes.  I then inserted distraction screws in C5 and C6, distracted the interspace, and then used the high speed drill to corticate the vertebra, and placed at C5-6, and drilled the anterior vertebral disk.  I thinned out the posterior longitudinal ligament with the drill, and then incised it with the ______ knife.  I removed the ligament with a Kerrison punch, undercutting of the vertebral bone plates at A2-1, decompressing the thecal sac.  I performed a generous foraminotomy about the bilateral C6 nerve root.  I then repeated this procedure at C6-7, i.e., I removed the distraction screw from C5, placed it to C7, dissected the C6-7 interspace.  I incised the disk with the 15 blade scalpel, performed partial diskectomy  with the pituitary forceps and ______ curettes.  I used the high speed drill to decorticate the upper ______ the patients C6-7, to drill away the remainder of the intervertebral disk.  The posterior longitudinal ligament.  I incised the ligament with the erectile knife, removed it with  the Kerrison punch, undercarved the end of the vertebra and plate at V7-8, decompressing the thecal sac.  I performed a generous foraminotomy along with the C7 nerve root.  I then turned my attention to the arthrodesis.  I obtained iliac crest tricortical allograft bone graft and fashioned them to the approximate dimensions of 6 mm in height, 1 cm in depth.  I inserted one bone graft into the streck at C6-7 interspace.  I removed the distraction screw from C7, placed it back in C5, distracted C5-6, and placed the other bone graft at the C5-6 interspace.  I removed distraction screws, and there was good ______ bone graft at each level.  I now turned my attention to the anterior spinal instrumentation.  I obtained the appropriate length at the anterior cervical plate, laid it along the anterior aspect of the vertebral body from C5 down to C7.  I drilled two holes at C5, two at C6, two at C7, tapped holes, and secured the plate to the vertebral bodies with two 12 mm screws at each vertebral body.  I obtained intraoperative radiographs.  It demonstrated good positioning of the upper screws (I could not see the lower screws very well because of the patients shoulders), but they looked good.  I then secured the screws to the plate using the Cam tightener.  I copiously irrigated with warm Bacitracin solution, removed the solution, and achieved hemostasis with bipolar electrocautery and Gelfoam.  I removed the ______ self-retaining retractor, inspected the esophagus for any damage, there was none apparent.  I then reapproximated the patients platysmal muscle with interrupted 3-0 Vicryl suture, subcutaneous tissue with interrupted 3-0 vertical suture, and the skin with Steri-Strips and Benzoin.  The wound was then covered with Bacitracin ointment.  A sterile dressing was applied.  The patient was subsequently extubated by the anesthesia team and transported to the post-anesthesia care unit in  stable condition.  All sponge, needle, and instrument counts were correct at the end of this case. Dictated by:   Cristi Loron, M.D.  Attending Physician:  Tressie Stalker D DD:  01/05/01 TD:  01/06/01 Job: [id number]

## 2010-08-28 NOTE — H&P (Signed)
[name], [name]                ACCOUNT NO.:  [id number]   MEDICAL RECORD NO.:  [id number]          PATIENT TYPE:  MAT   LOCATION:  MATC                          FACILITY:  [facility]   PHYSICIAN:  Timothy P. Fontaine, M.D.DATE OF BIRTH:  1956/02/25   DATE OF ADMISSION:  01/23/2005  DATE OF DISCHARGE:                                HISTORY & PHYSICAL   CHIEF COMPLAINT:  Fever, lower abdominal pain.   HISTORY OF PRESENT ILLNESS:  A 55 year old status post TVH approximately 1  week ago with increasing lower abdominal discomfort and fever.  The patient  was evaluated in the office several days following discharge complaining of  low-grade temperature and lower abdominal discomfort.  Evaluation to include  ultrasound for cuff collections were negative, and her urine was suspicious  for UTI, and the patient was begun on ciprofloxacin. The patient initially  felt better for several days and then, over the last 24 hours or so, has  progressively felt worse with increasing temperatures.  The patient has been  drinking fluids and eating, has been having regular bowel movements without  difficulty and has been voiding freely with clear yellow urine.   PAST MEDICAL HISTORY:  1.  Asthma.  2.  Reflux.   PAST SURGICAL HISTORY:  1.  Vaginal hysterectomy.  2.  Separate left salpingo-oophorectomy.  3.  Tonsillectomy.  4.  Left breast biopsy.  5.  Bilateral hernia.  6.  Spinal cyst.   CURRENT MEDICATIONS:  1.  Prilosec 1 p.o. daily.  2.  Zyrtec 1 p.o. daily.  3.  Albuterol inhaler p.r.n.  4.  Advair inhaler p.r.n.  5.  Macrobid twice daily.  6.  Cipro twice daily.   ALLERGIES:  1.  PENICILLIN and PENICILLIN DERIVATIVES: Swelling rash, hives.  2.  MYCIN ANTIBIOTICS.  3.  CODEINE and HYDROCODONE DERIVATIVES.  4.  IODINE.  5.  SHELLFISH.  6.  PEANUTS.  7.  LATEX.   REVIEW OF SYSTEMS:  Noncontributory.   FAMILY HISTORY:  Noncontributory.   ADMISSION PHYSICAL EXAMINATION:  VITAL SIGNS:   Blood pressure 101/50,  115/69.  Initial temperature 101.2, followup 103.2, respirations 20, pulse  83.  HEENT:  Normal.  LUNGS: Clear.  CARDIAC:  Regular rate.  No rubs, murmurs, or gallops.  ABDOMEN:  Soft, active bowel sounds without guarding or rebound. Mild  tenderness to deep palpation, lower quadrant, right greater than left.  PELVIC: Bimanual: posterior repair suture line intact.  Cuff intact, mildly  tender. No gross masses.   LABORATORY EVALUATION:  UA negative.  Comprehensive metabolic panel  negative. Urine culture from January 20, 2005, prior to starting her  antibiotics as outpatient showed beta strep 15,000 colonies/mL.  CBC shows  WBC 18,800, hemoglobin 12.7.   Renal urinary ultrasound: Kidneys are normal size and shape, normal-  appearing urinary bladder.  No evidence of hydronephrosis, bilateral  ureteral jets documented in the urinary bladder.   Outpatient gynecologic ultrasound week prior to admission was normal without  evidence of cuff collection.   ASSESSMENT AND PLAN:  A 55 year old one week postoperative status post total  vaginal  hysterectomy, number of allergies, received cipro prophylaxis for  her surgery, developed what appeared to be urinary tract infection within  several days of the surgery.  At that point, pelvic exam was normal.  Ultrasound showed no evidence of cuff hematoma or abscess.  Urinalysis  suspicious for urinary tract infection.  The patient was started on  ciprofloxacin, initially responded, and now has presented with increasing  temperature and lower abdominal-pelvic discomfort.  She has no acute  findings, no evidence of ureteral obstruction on ultrasound.   Working diagnoses includes partially treated urinary versus cuff cellulitis.  Exam also revealed normal extremities with no evidence of deep vein  thrombosis peripherally.   Discussed case with Dr. Darlina Sicilian, given the patient's multiple allergies as  far as best antibiotics  given her recent treatment with Cipro, and he feels  clindamycin and gentamycin would give appropriate coverage.  We will go  ahead and initiate clindamycin and gentamycin IV, monitor temperature and  clinical response.      Timothy P. Fontaine, M.D.  Electronically Signed     TPF/MEDQ  D:  01/23/2005  T:  01/23/2005  Job:  [id number]

## 2010-08-28 NOTE — H&P (Signed)
[name], [name]                ACCOUNT NO.:  [id number]   MEDICAL RECORD NO.:  [id number]          PATIENT TYPE:  MAT   LOCATION:  MATC                          FACILITY:  [facility]   PHYSICIAN:  Juan H. Lily Peer, M.D.DATE OF BIRTH:  06/30/1955   DATE OF ADMISSION:  04/16/2005  DATE OF DISCHARGE:                                HISTORY & PHYSICAL   CHIEF COMPLAINT:  1.  Elevated temperature.  2.  Right abdominal pain.  3.  Right flank pain.   HISTORY:  The patient is a 55 year old who in the month of October had  undergone a transvaginal hysterectomy and was seen in the office on October  14th and admitted with lower abdominal discomfort and fever. There was a  suspicion of possibly a cuff cellulitis but the ultrasound done did not  demonstrate any evidence of fluid collection. Her urine had been suspicious  for urinary tract infection and the patient had been started on  ciprofloxacin. The patient initially felt better for several days. Then  prior to admission over 24 hours she began to progressively feel worse with  elevated temperature and was admitted to the hospital and had been placed on  clindamycin and gentamicin IV. She was discharged home after she felt  better. She presented back to the office several months later, approximately  2 days ago, complaining of a dull aching sensation in the lower abdomen  slightly towards her flank region. An ultrasound in the office had  demonstrated a complex cystic solid mass measuring 5.7 x 5.0 x 4.8 cm with  increased vascular flow in the perimeter of the mass. A second cystic mass,  thick wall, measuring 2.8 x 2.1 x 2.3 cm with some low level echoes and  increased vascular flow perimeter of the mass. It was suspected perhaps this  was a hemorrhagic cyst. The patient had a urinalysis also done in the office  and was being followed by her primary care physician, Dr. Colin Broach.  The urinalysis had only occasional wbc's, 4-6 rbc's  and trace bacteria. He  did run a culture and the culture came back which demonstrated staph aureus,  the organism that was isolated. She called the office late this afternoon  stating that she had a temperature as high as 103 and dull ache, right lower  quadrant pain shooting to her flank once again, and had dysuria. Upon  arrival to the emergency room at [facility], her temperature was 101.3.  The patient was complaining of some chills and with the above mentioned  symptoms. She did have some right CVA tenderness. Abdomen was soft. She did  have good bowel sounds but tender from the lower abdomen midline to the  right flank region. On a bimanual examination there was a fullness palpated  behind the cuff which could also be felt on a rectal exam.   Her laboratory data in the emergency room this evening revealed a white  blood count of 20.4 thousand, hemoglobin 11.4, hematocrit 34.4, platelet  count 405,000. Comprehensive metabolic panel with essentially normal  parameters. The patient will be admitted for  intravenous antibiotics and  further evaluation of her fever to rule out the possibility of a  pyelonephritis or renal abscess, or abdominopelvic abscess. Due to the  patient's multitude of allergies she will undergo not only a chest x-ray PA  and lateral, but an MRI of the abdomen and pelvis.   PAST MEDICAL HISTORY:  1.  History of transvaginal hysterectomy, posterior repair in October, 2006.  2.  History of asthma and reflux.   PAST SURGICAL HISTORY:  1.  Vaginal hysterectomy with posterior repair.  2.  Separate left salpingo-oophorectomy.  3.  Tonsillectomy.  4.  Left breast biopsy.  5.  Bilateral herniorrhaphies.  6.  Spinal cyst.   CURRENT MEDICATIONS:  1.  Prilosec one p.o. daily.  2.  Zyrtec one p.o. daily.  3.  Albuterol inhaler p.r.n.  4.  Advair inhaler p.r.n.   ALLERGIES:  1.  PENICILLIN AND PENICILLIN DERIVATIVES (SWELLING, RASH AND HIVES).  2.  MYCIN  CONTAINING ANTIBIOTICS.  3.  CODEINE AND HYDROCODONE.  4.  IODINE.  5.  SHELLFISH.  6.  PEANUTS.  7.  LATEX.   FAMILY HISTORY:  Hypertension, cardiovascular disease in her father.  Maternal cousin, maternal aunt with history of ovarian cancer.   PHYSICAL EXAMINATION:  GENERAL:  Well-developed, well-nourished female with  above mentioned complaints.  VITAL SIGNS:  Temperature 101.3, blood pressure 115/73, pulse 113.  HEENT:  Unremarkable.  NECK:  Supple, trachea midline. No carotid bruits. No thyromegaly.  LUNGS:  Clear to auscultation without rhonchi or wheezing.  HEART:  No murmurs or gallops. Heart rate 113.  ABDOMEN:  Soft, positive bowel sounds, tender in right lower abdomen from  mid portion to the right flank region with positive right CVA tenderness.  PELVIC EXAM:  Bartholin, urethra, Skene within normal limits. Vaginal cuff  tender on palpation. Fullness and tenderness noted in the cuff region  confirmed the rectal exam.   ASSESSMENT:  55 year old patient status post transvaginal hysterectomy in  October, hospitalized 1.5 weeks later with secondary abdominal pain and  fever. Was place on intravenous antibiotics, released from the hospital. Had  done well until a couple of days ago when she started having some aching  sensation in the lower abdomen and this afternoon started having  temperatures at home as high as 103. Urine culture that had been done in the  office a few days ago resulted in an isolated colonies of staph aureus being  identified and she did have an ultrasound which had demonstrated that she  had a 5.7 x 5.0 x 4.8 complex cystic mass along with a 2.8 x 2.1 x 2.3  second cystic mass. Working diagnosis initially was a hemorrhagic cyst.   The patient will be started on broad coverage antibiotic although she has a  multitude of allergies. She will be placed on clindamycin 900 IV q.8h along with gentamicin q.8h as well. Blood cultures x2 30 minutes apart will  be  drawn. Her CBC had demonstrated leukocytosis with a white count of 20,000  with a differential demonstrating 16,000 on the granulocytes. Her  comprehensive metabolic panel was with normal parameters. The patient will  have a PA and lateral chest x-ray along with an MRI of the abdomen and  pelvis. Will determine if this is a pyelonephritis as she did have CVA  tenderness, along with the findings in the urine culture, along with  concurrent hemorrhagic cysts or to rule out the possibility of this being an  intra-abdominal abscess which may need to  be evacuated once sufficient  antibiotic is on board and we have brought her temperature down. We also may  want to consider consultation with infectious disease if she does not break  her fever with the above mentioned antibiotics. Otherwise will wait for the  result of the cultures as well. All the above was discussed with [name]  and her daughter. We will follow closely.   PLAN:  Is for assessment above.      Juan H. Lily Peer, M.D.  Electronically Signed     JHF/MEDQ  D:  04/16/2005  T:  04/16/2005  Job:  [id number]

## 2010-08-28 NOTE — Op Note (Signed)
NAME:  [name], [name]                  ACCOUNT NO.:  [id number]   MEDICAL RECORD NO.:  [id number]                   PATIENT TYPE:  AMB   LOCATION:  [facility]C                                  FACILITY:  [facility]   PHYSICIAN:  Timothy P. Fontaine, M.D.           DATE OF BIRTH:  21-Mar-1956   DATE OF PROCEDURE:  10/29/2002  DATE OF DISCHARGE:                                 OPERATIVE REPORT   PREOPERATIVE DIAGNOSIS:  Left adnexal cystic mass.   POSTOPERATIVE DIAGNOSES:  1. Ovarian versus paraovarian pedunculated cyst.  2. Leiomyomata.   PROCEDURE:  Laparoscopic left salpingo-oophorectomy.   SURGEON:  Timothy P. Fontaine, M.D.   ASSISTANTGaetano Hawthorne. Lily Peer, M.D.   ANESTHESIA:  General endotracheal anesthesia.   ESTIMATED BLOOD LOSS:  Minimal.   COMPLICATIONS:  None.   SPECIMENS:  Left salpingo-oophorectomy with accompanying cyst.   FINDINGS:  Anterior cul-de-sac normal.  Posterior cul-de-sac normal.  Uterus  normal size with small subserosal leiomyomata right lower uterine area  posteriorly.  Right fallopian tube with evidence of prior tubal  sterilization with Falope ring noted.  Tubal segment overall normal in  appearance.  Right ovary grossly normal, free and mobile.  Left fallopian  tube with evidence of prior tubal sterilization.  Falope ring noted.  Grossly normal in appearance.  Left ovary grossly normal in appearance with  pedunculated 4 cm cyst rising from the uterine ovarian pedicle region, felt  to either represent ovarian versus paraovarian.  Transillumination was  simple in appearance.  Adnexa was free of adhesions.  Cyst was noted to be  removed intact.  Pelvis otherwise without evidence of endometriosis,  adhesions or any other abnormalities.  Appendix was visualized, grossly  normal, free and mobile.  Liver was smooth.  No abnormalities.  Gallbladder  was not clearly visualized.  No evidence of upper abdominal adhesions or  abnormalities to limit  inspection.   DESCRIPTION OF PROCEDURE:  The patient was taken to the operating room and  underwent general endotracheal anesthesia, placed in the low dorsal  lithotomy position.  She received an abdominal, perineal and vaginal  preparation with Hibiclens.  Foley catheter was placed in sterile technique.  Examination under anesthesia performed and a Hulka tenaculum placed in the  uterus.  The patient was draped in the usual fashion.  A vertical  infraumbilical incision was made.  The Veress needle placed with position  verified with water and three liters of carbon dioxide gas infused without  difficulty.  The 10 mm laparoscopic trocar was then placed without  difficulty and its position verified visually.  Right and left suprapubic 5  mm ports were then placed under direct visualization after transillumination  for the vessels without difficulty.  Examination of the pelvic organs and  abdominal examination was then carried out with findings noted above.  Left  adnexa was inspected.  Left ureter identified and found to be away from the  operative site and using the tripolar  electrocautery device, the  infundibulopelvic ligament was exposed and bipolar cauterized to a flow of  zero with several applications and then subsequently transected.  The ovary  and pedunculated cyst was then progressively freed from its attachments  through bipolar cauterization and incision of meso-ovarian and ultimately  the uterine ovarian pedicle and fallopian tube.  The left sidewall was  irrigated, inspected and adequate hemostasis was found at the surgical site  and again the ureter was found to be away from the operative field.  The  right suprapubic port was then converted to a 10 mm port under direct  visualization without difficulty and an Endopouch bag was placed within the  peritoneal cavity and the ovary and cyst were placed within the bag.  The  bag was subsequently removed through the right  suprapubic port after  spending the incision to allow delivery of the specimen intact. This was  sent to pathology.  Subsequently the pelvis was copiously irrigated.  The  gas was allowed to escape to reinspect the left pelvic sidewall to insure  adequate hemostasis under low pressure situation.  The suprapubic ports were  then removed and again inspected under low pressure situation showing  adequate hemostasis.  The infraumbilical port was then backed out under  direct visualization showing adequate hemostasis and no evidence of hernia  formation.  The fascia was exposed in the right suprapubic port region and  was closed using 0 Vicryl suture in a running stitch.  An interrupted  subcutaneous fascia stitch was placed infraumbilically. All port sites were  cleansed, infiltrated with 0.25% Marcaine and subsequently reapproximated  using Dermabond skin adhesive.  The Hulka tenaculum was removed.  The Foley  removed.  The patient was placed in the supine position and awakened without  difficulty and taken without difficulty in good condition, having tolerated  the procedure well.                                               Timothy P. Audie Box, M.D.    TPF/MEDQ  D:  10/29/2002  T:  10/29/2002  Job:  [id number]

## 2010-08-28 NOTE — Op Note (Signed)
[name], [name]                ACCOUNT NO.:  [id number]   MEDICAL RECORD NO.:  [id number]          PATIENT TYPE:  INP   LOCATION:  1426                         [facility]ACILITY:  [facility]   PHYSICIAN:  Sigmund I. Patsi Sears, M.D.DATE OF BIRTH:  [birth year]   DATE OF PROCEDURE:  05/03/2005  DATE OF DISCHARGE:                                 OPERATIVE REPORT   PREOPERATIVE DIAGNOSIS:  Right tubo-ovarian abscess with right ureteral  obstruction.   POSTOPERATIVE DIAGNOSIS:  Right tubo-ovarian abscess with right ureteral  obstruction.   OPERATION:  Cystourethroscopy, right retrograde pyelogram with  interpretation, right double-J catheter.   SURGEON:  Sigmund I. Patsi Sears, M.D.   PREPARATION:  After appropriate preanesthesia, the patient was brought to  the operating room, placed on the operating table in dorsal supine position  where general endotracheal anesthesia was induced. She was then replaced in  dorsal lithotomy position where the pubis was prepped with Betadine  solution, and draped in the usual fashion.   DESCRIPTION OF PROCEDURE:  Cystourethroscopy was accomplished which showed a  normal-appearing bladder. Right retrograde pyelogram was performed, which  showed upper to mid ureter moderate mild to moderate hydronephrosis. The  renal pelvis was normal. There was no evidence of renal stones, tumor or  diverticular formation. A guidewire was passed and coiled in the renal  pelvis, and double-J catheter, measuring 7 French x 26 cm was passed and  coiled in the renal pelvis, and in the bladder. The patient tolerated this  portion of the procedure well, and then underwent right tubo-ovarian abscess  removal.      Sigmund I. Patsi Sears, M.D.  Electronically Signed     SIT/MEDQ  D:  05/03/2005  T:  05/04/2005  Job:  [id number]   cc:   Marcial Pacas P. Fontaine, M.D.  Fax: [phone]

## 2010-08-28 NOTE — H&P (Signed)
NAME:  [name], [name]                ACCOUNT NO.:  [id number]   MEDICAL RECORD NO.:  [id number]          PATIENT TYPE:  AMB   LOCATION:  SDC                           [facility]ILITY[facility]   PHYSICIAN:  Timothy P. Fontaine, M.D.DATE OF BIRTH:  [birth year]   DATE OF ADMISSION:  01/11/2005  DATE OF DISCHARGE:                                HISTORY & PHYSICAL   CHIEF COMPLAINT:  Uterine prolapse, mild rectocele.   HISTORY OF PRESENT ILLNESS:  This 55 year old female presents complaining of  a bulging per vagina. The patient had episode of straining and subsequently  had a bulge through her vagina which on evaluation was found to have a  second-degree uterine prolapse. She had a mild rectocele on rectovaginal  exam. No evidence of cystocele. The patient is admitted for a TVH posterior  repair.   </PAST MEDICAL HISTORY/  Reflux and asthma.   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   CURRENT MEDICATIONS:  Zyrtec, Advair, albuterol p.r.n. and Prilosec.   ALLERGIES:  PENICILLIN AND CODEINE.   REVIEW OF SYSTEMS:  Noncontributory.   FAMILY HISTORY:  Noncontributory.   SOCIAL HISTORY:  Noncontributory.   PHYSICAL EXAMINATION:  VITAL SIGNS: Stable, afebrile.  HEENT: Normal.  LUNGS:  Clear.  CARDIAC:  Regular rate, no rubs, murmurs or gallops.   REVIEW OF SYSTEMS:  ABDOMINAL EXAM:  Is benign.  PELVIC:  External BUS,  vagina grossly normal with a second-degree uterine prolapse. Cervix at the  level of the introitus. No gross cystocele. Rectovaginal exam does show a  mild rectocele. The uterus is normal in size, shape and contour. Adnexa  without masses or tenderness.   ASSESSMENT:  A 55 year old G5, P2 female status post tubal sterilization  status post left salpingo-oophorectomy with uterine prolapse  symptomatic,  mild rectocele who desires TVH, posterior repair. The patient had a lengthy  discussion in several outpatient encounters discussing ovarian conservation.  She has one ovary and the issue as to whether to keep her ovary for  continued hormone production versus removing her ovary were reviewed and the  risks, benefits and issues for both the choices were discussed. She  ultimately has decided to keep her ovary. We did do an [facility] as an outpatient  preoperatively and it was 6.3. She would prefer to go through natural  menopause. She understands that she is at risk for ovarian cancer and benign  ovarian disease in the future and she understands and accepts this. She also  understands and gives me permission that I may remove her ovary during the  surgery if for either technical reasons, adherent to the uterus and able to  separate it or it appears abnormal and in my best judgment would be to  remove this and she understands and accepts this. I reviewed the expected  intraoperative, postoperative courses with her in the acute intraoperative,  postoperative risks. She historically does not have urinary incontinence,  has no evidence of cystocele  on exam. She does have a mild rectocele on exam  and understands that we will go ahead and attempt to repair this from a  prophylactic standpoint. She understands that in any time during the  procedure I may convert to an abdominal approach if it is felt unsafe to  proceed vaginally of if  complications arise. I discussed the obvious  absolute sterility associated with hysterectomy as well as sexuality  following hysterectomy to include persistent orgasmic dysfunction as well as  persistent dyspareunia. The risks of infection internally requiring  prolonged antibiotics, abscess formation requiring reoperation, abscess  drainage and if indeed an abdominal incision is made, the risks of wound  complications, opening and draining of incisions,  closure by secondary  intention was all discussed, understood, and accepted. The risks of  hemorrhage necessitating transfusion and the risks of transfusion were  discussed to include transfusion reaction, hepatitis, HIV, mad cow disease  and other unknown entities. The issues of autologous blood were reviewed. I  offered that option and if she would want to proceed with this, I would  order this, although the likelihood of transfusion and low as she is decided  not to do autologous blood. The risks of inadvertent injury to internal  organs including bowel, bladder, ureters, vessels and nerves necessitating  major exploratory reparative surgeries and future reparative surgeries  including major exploratory laparotomy and ostomy formation was all  discussed, understood and accepted. She understands that we are working  around the rectum. There is a realistic risk of rectal damage as well as  operating around the bladder and the risks of bladder injury were all  discussed, understood and accepted. The patient's questions were answered to  her satisfaction. She is ready to proceed with surgery.      Timothy P. Fontaine, M.D.  Electronically Signed     TPF/MEDQ  D:  01/08/2005  T:  01/08/2005  Job:  [id number]

## 2010-09-28 ENCOUNTER — Ambulatory Visit (INDEPENDENT_AMBULATORY_CARE_PROVIDER_SITE_OTHER): Payer: BC Managed Care – PPO | Admitting: Gynecology

## 2010-09-28 DIAGNOSIS — R350 Frequency of micturition: Secondary | ICD-10-CM

## 2010-09-28 DIAGNOSIS — B373 Candidiasis of vulva and vagina: Secondary | ICD-10-CM

## 2010-09-28 DIAGNOSIS — R823 Hemoglobinuria: Secondary | ICD-10-CM

## 2010-09-28 DIAGNOSIS — N898 Other specified noninflammatory disorders of vagina: Secondary | ICD-10-CM

## 2010-10-06 ENCOUNTER — Encounter (INDEPENDENT_AMBULATORY_CARE_PROVIDER_SITE_OTHER): Payer: BC Managed Care – PPO | Admitting: Gynecology

## 2010-10-06 ENCOUNTER — Other Ambulatory Visit (HOSPITAL_COMMUNITY)
Admission: RE | Admit: 2010-10-06 | Discharge: 2010-10-06 | Disposition: A | Discharge status: Home / Self Care | Payer: BC Managed Care – PPO | Source: Ambulatory Visit | Attending: Gynecology | Admitting: Gynecology

## 2010-10-06 ENCOUNTER — Other Ambulatory Visit: Payer: Self-pay | Admitting: Gynecology

## 2010-10-06 DIAGNOSIS — Z124 Encounter for screening for malignant neoplasm of cervix: Secondary | ICD-10-CM | POA: Insufficient documentation

## 2010-10-06 DIAGNOSIS — Z01419 Encounter for gynecological examination (general) (routine) without abnormal findings: Secondary | ICD-10-CM

## 2011-06-15 ENCOUNTER — Encounter: Payer: Self-pay | Admitting: Gynecology

## 2011-06-15 ENCOUNTER — Other Ambulatory Visit: Payer: Self-pay | Admitting: Gynecology

## 2011-06-15 ENCOUNTER — Ambulatory Visit (INDEPENDENT_AMBULATORY_CARE_PROVIDER_SITE_OTHER): Payer: BC Managed Care – PPO | Admitting: Gynecology

## 2011-06-15 DIAGNOSIS — N898 Other specified noninflammatory disorders of vagina: Secondary | ICD-10-CM

## 2011-06-15 DIAGNOSIS — N951 Menopausal and female climacteric states: Secondary | ICD-10-CM

## 2011-06-15 DIAGNOSIS — IMO0001 Reserved for inherently not codable concepts without codable children: Secondary | ICD-10-CM

## 2011-06-15 DIAGNOSIS — R350 Frequency of micturition: Secondary | ICD-10-CM

## 2011-06-15 LAB — URINALYSIS W MICROSCOPIC + REFLEX CULTURE
Bilirubin Urine: NEGATIVE
Protein, ur: NEGATIVE mg/dL
Urobilinogen, UA: 0.2 mg/dL (ref 0.0–1.0)

## 2011-06-15 LAB — WET PREP FOR TRICH, YEAST, CLUE
Clue Cells Wet Prep HPF POC: NONE SEEN
Trich, Wet Prep: NONE SEEN

## 2011-06-15 MED ORDER — TINIDAZOLE 500 MG PO TABS: 2.0000 g | ORAL_TABLET | Freq: Once | ORAL | Status: DC

## 2011-06-15 MED ORDER — SULFAMETHOXAZOLE-TRIMETHOPRIM 800-160 MG PO TABS: 1.0000 | ORAL_TABLET | Freq: Two times a day (BID) | ORAL | Status: DC

## 2011-06-15 NOTE — Patient Instructions (Signed)
Take medication as prescribed. Follow up if her symptoms persist or recur.

## 2011-06-15 NOTE — Progress Notes (Signed)
Patient presents complaining of vaginal discharge with odor. Also some urinary frequency. No fever chills nausea vomiting. She does have some constipation that she is dealing with. She's also is to see the rheumatologist due to some arthritic pains tomorrow. She continues to have her hot flushes that are worse at night. She had a trial of estrogen patch for year did well with this. She is now using soy does well during the day but notes the hot flashes primarily at night.  Exam was Amy chaperone present. Abdomen soft nontender without masses guarding rebound organomegaly. Pelvic external BUS vagina with white discharge. Bimanual without masses or tenderness.  Assessment and plan: 1. White discharge. KOH wet prep consistent with BV. We'll treat with Tindamax 2 g daily x2 days. Alcohol avoidance reviewed. 2. Urinary frequency. UA is consistent with a very low-grade UTI. Will cover with Septra DS one by mouth twice a day x3 days. Follow up if symptoms persist or recur otherwise follow up and she is due for her annual exam.

## 2011-06-16 ENCOUNTER — Telehealth: Payer: Self-pay | Admitting: *Deleted

## 2011-06-16 MED ORDER — SULFAMETHOXAZOLE-TRIMETHOPRIM 800-160 MG PO TABS: 1.0000 | ORAL_TABLET | Freq: Two times a day (BID) | ORAL | Status: AC

## 2011-06-16 MED ORDER — TINIDAZOLE 500 MG PO TABS: 2.0000 g | ORAL_TABLET | Freq: Once | ORAL | Status: AC

## 2011-06-16 MED ORDER — FLUCONAZOLE 150 MG PO TABS: 150.0000 mg | ORAL_TABLET | Freq: Once | ORAL | Status: AC

## 2011-06-16 NOTE — Telephone Encounter (Signed)
Left message that  rx sent to pharmacy 

## 2011-06-16 NOTE — Telephone Encounter (Signed)
Diflucan 150 mg prescribed with 2 refills

## 2011-06-16 NOTE — Telephone Encounter (Signed)
Pt was seen yesterday for Urinary Frequency and vaginal discharge and giving SEPTRA DS and TINDAMAX 500 MG tablet. Pt said that she will need medication for yeast. Pt said that when taking antibody she will get yeast. Please advise

## 2011-06-16 NOTE — Progress Notes (Signed)
Addended by: Aura Camps on: 06/16/2011 10:20 AM   Modules accepted: Orders

## 2011-06-17 LAB — URINE CULTURE: Organism ID, Bacteria: NO GROWTH

## 2011-10-13 IMAGING — CR DG CHEST 2V
2 series · 2 of 2 positions shown · non-contrast
Comparison: 04/16/2005.

CLINICAL DATA: Left upper chest pain.  Pain with inspiration.
Shortness of breath.

CHEST - 2 VIEW

[w chest pa]
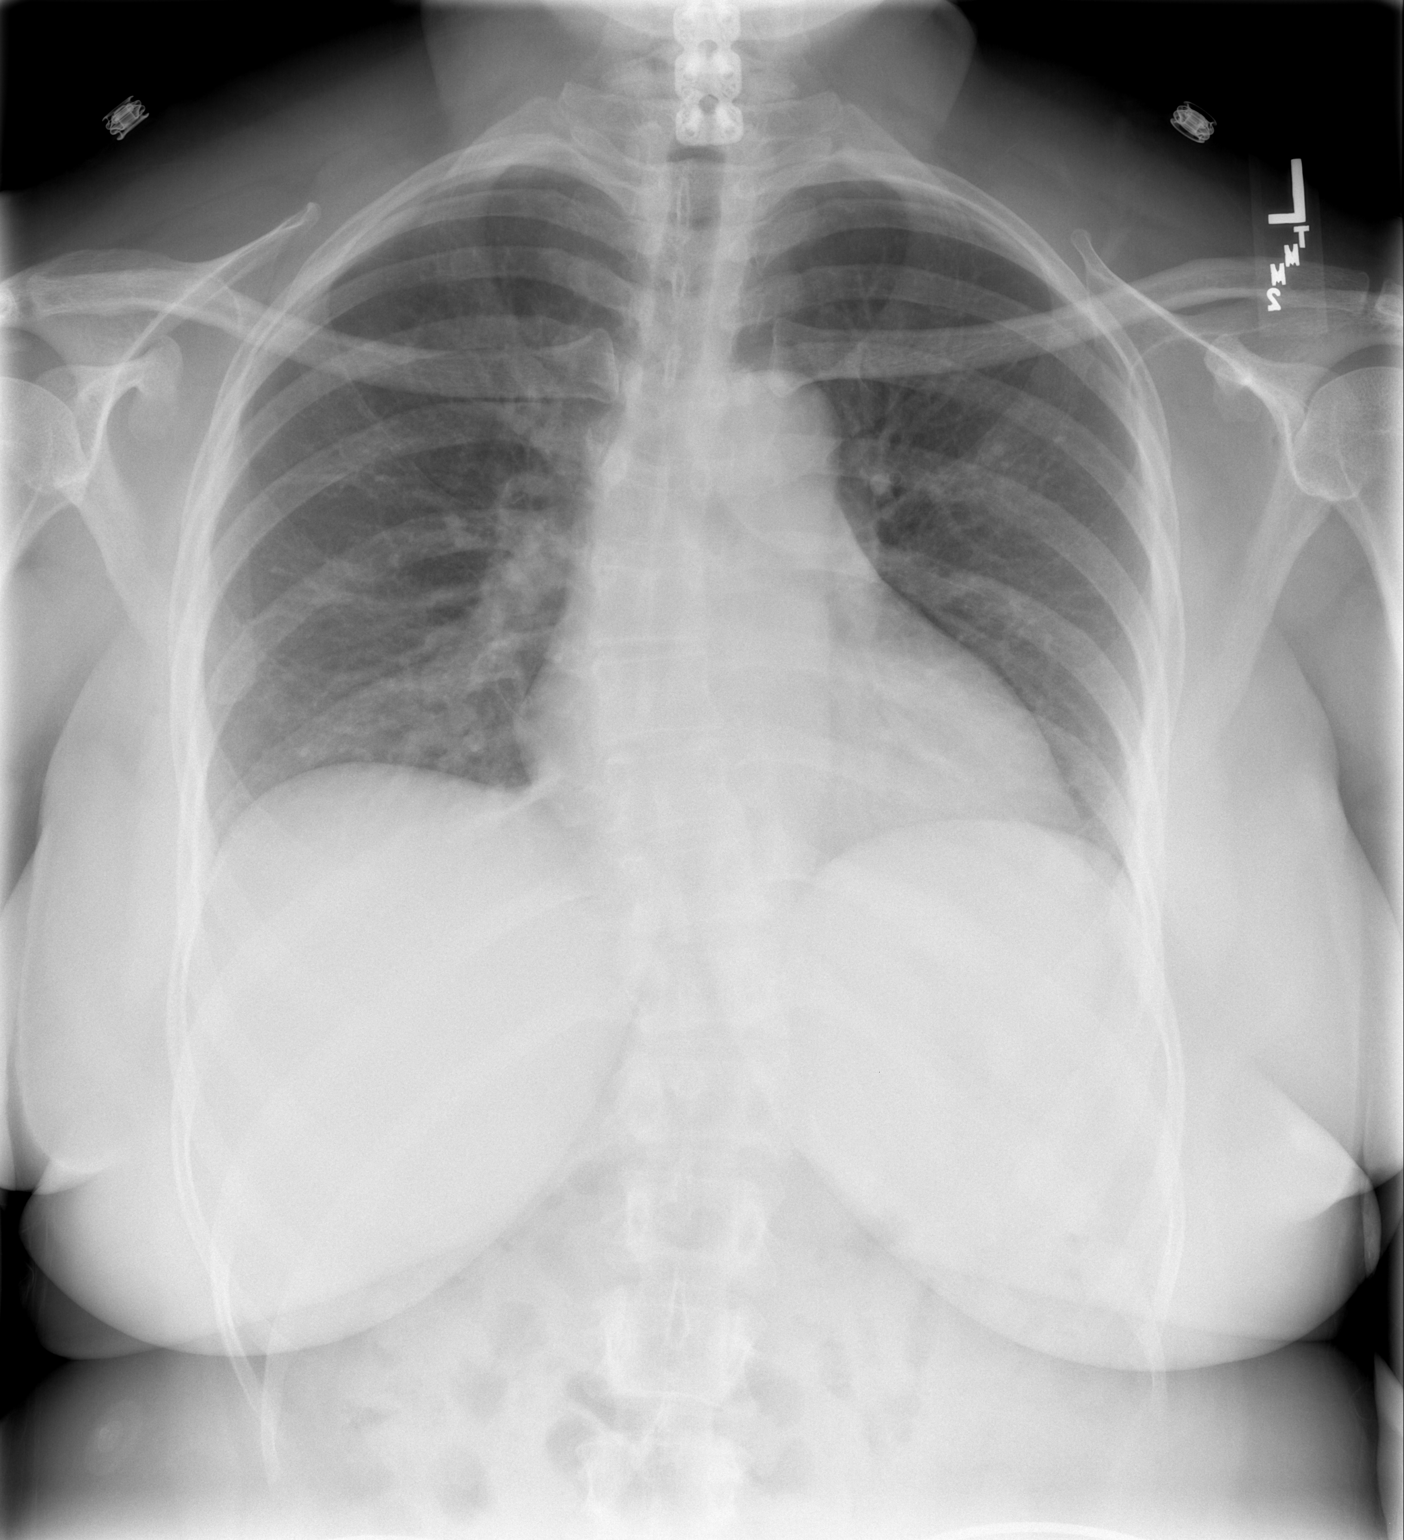

[w chest lat]
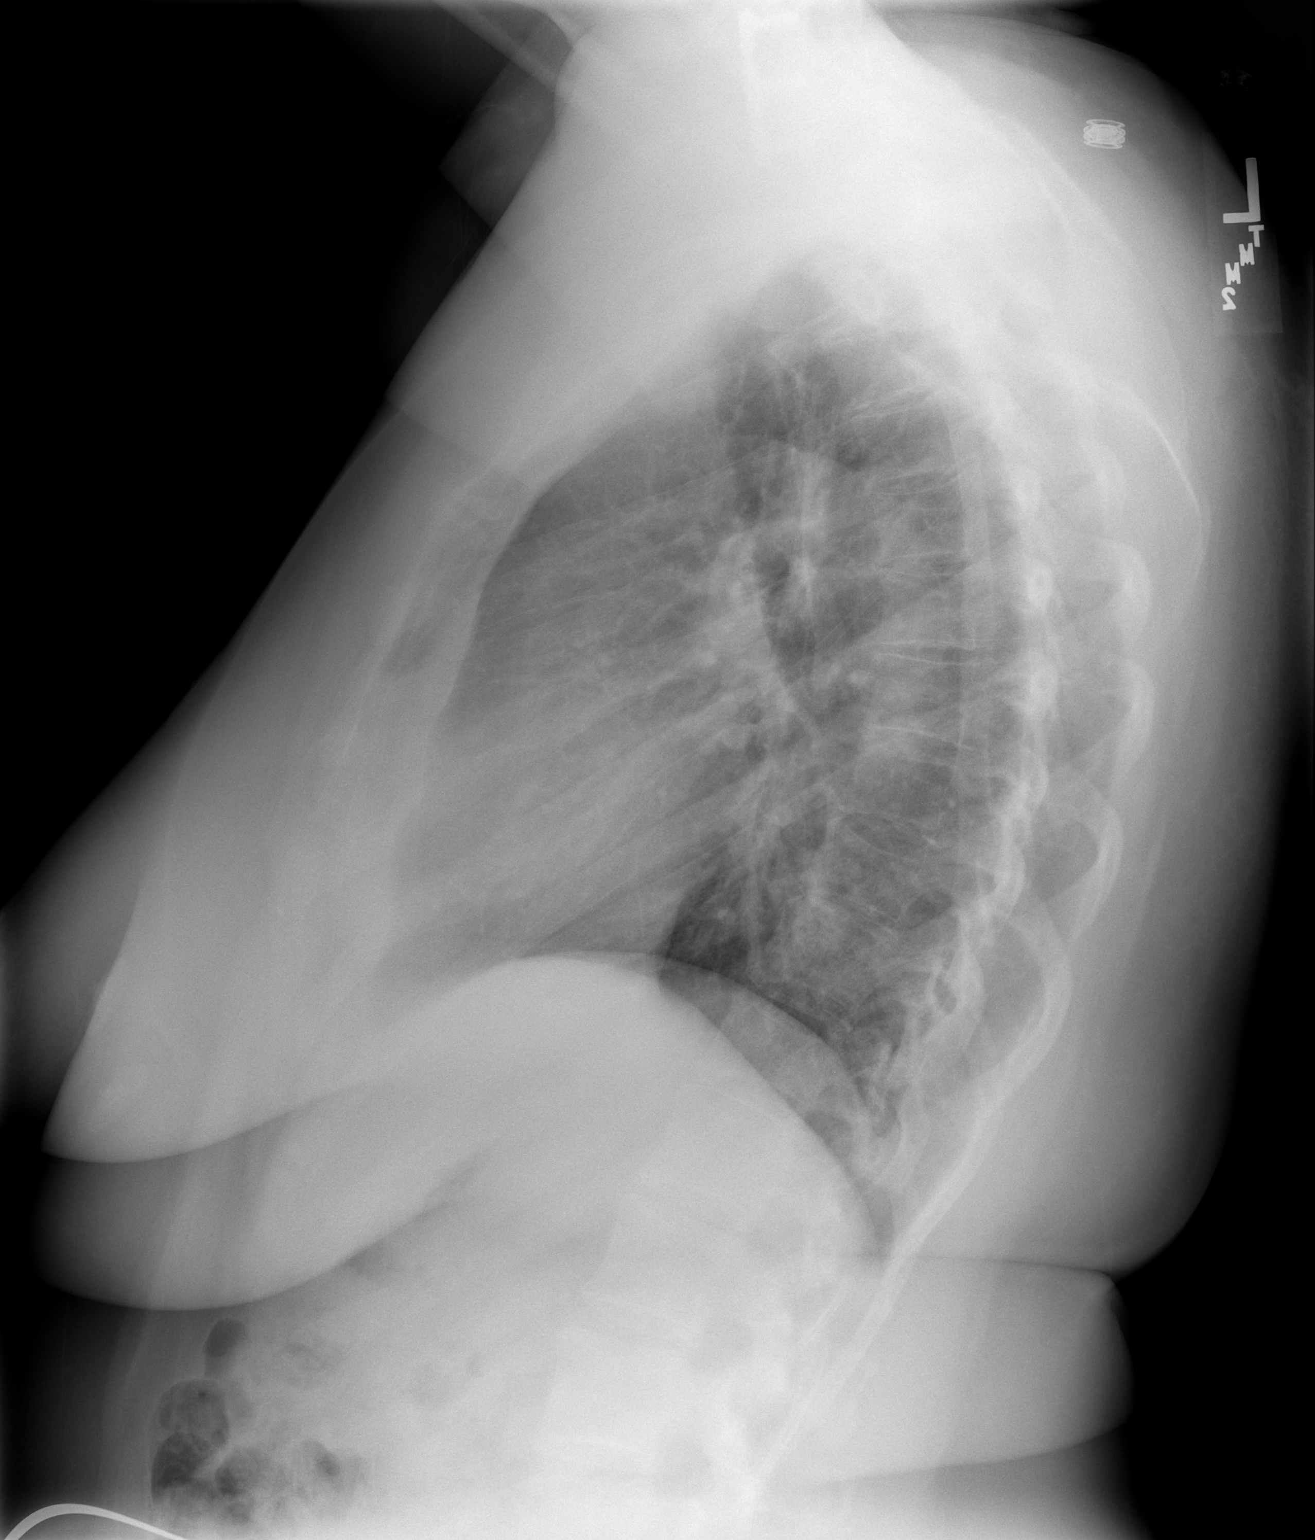

[2 of 2 positions shown; findings below may reference images not displayed]

FINDINGS: Lung volumes are low. The lungs are clear without focal
infiltrate, edema, pneumothorax or pleural effusion. The
cardiopericardial silhouette is within normal limits for size.
Imaged bony structures of the thorax are intact. Thoracolumbar
scoliosis is stable.
IMPRESSION: No edema or focal airspace consolidation.

## 2011-10-18 ENCOUNTER — Other Ambulatory Visit: Payer: Self-pay | Admitting: *Deleted

## 2011-10-18 DIAGNOSIS — R922 Inconclusive mammogram: Secondary | ICD-10-CM

## 2011-10-19 ENCOUNTER — Other Ambulatory Visit: Payer: Self-pay

## 2011-10-19 ENCOUNTER — Other Ambulatory Visit: Payer: Self-pay | Admitting: Gynecology

## 2011-10-19 DIAGNOSIS — R922 Inconclusive mammogram: Secondary | ICD-10-CM

## 2011-10-19 DIAGNOSIS — N6459 Other signs and symptoms in breast: Secondary | ICD-10-CM

## 2011-10-19 NOTE — Progress Notes (Signed)
ERROR ORDERS ALREADY IN P.C.

## 2011-10-22 ENCOUNTER — Encounter: Payer: Self-pay | Admitting: Gynecology

## 2012-02-22 ENCOUNTER — Other Ambulatory Visit (HOSPITAL_COMMUNITY): Payer: Self-pay | Admitting: Internal Medicine

## 2012-02-22 DIAGNOSIS — J45909 Unspecified asthma, uncomplicated: Secondary | ICD-10-CM

## 2012-02-25 ENCOUNTER — Ambulatory Visit (INDEPENDENT_AMBULATORY_CARE_PROVIDER_SITE_OTHER): Payer: BC Managed Care – PPO | Admitting: Gynecology

## 2012-02-25 ENCOUNTER — Encounter: Payer: Self-pay | Admitting: Gynecology

## 2012-02-25 ENCOUNTER — Encounter (HOSPITAL_COMMUNITY): Payer: BC Managed Care – PPO

## 2012-02-25 VITALS — BP 128/82 | Ht 67.25 in | Wt 222.0 lb

## 2012-02-25 DIAGNOSIS — L293 Anogenital pruritus, unspecified: Secondary | ICD-10-CM

## 2012-02-25 DIAGNOSIS — Z01419 Encounter for gynecological examination (general) (routine) without abnormal findings: Secondary | ICD-10-CM

## 2012-02-25 MED ORDER — FLUCONAZOLE 150 MG PO TABS: 150.0000 mg | ORAL_TABLET | Freq: Once | ORAL | Status: DC

## 2012-02-25 NOTE — Patient Instructions (Signed)
Follow up in one year for her annual gynecologic exam. 

## 2012-02-25 NOTE — Progress Notes (Signed)
[name] [facility] 1955-11-22 [id number]        56 y.o.  [id number] for annual exam.    Past medical history,surgical history, medications, allergies, family history and social history were all reviewed and documented in the EPIC chart. ROS:  Was performed and pertinent positives and negatives are included in the history.  Exam: Biomedical scientist Filed Vitals:   02/25/12 0944  BP: 128/82  Height: 5' 7.25" (1.708 m)  Weight: 222 lb (100.699 kg)   General appearance  Normal Skin grossly normal Head/Neck normal with no cervical or supraclavicular adenopathy thyroid normal Lungs  clear Cardiac RR, without RMG Abdominal  soft, nontender, without masses, organomegaly or hernia Breasts  examined lying and sitting without masses, retractions, discharge or axillary adenopathy. Pelvic  Ext/BUS/vagina  normal with mild atrophic changes  Adnexa  Without masses or tenderness    Anus and perineum  normal   Rectovaginal  normal sphincter tone without palpated masses or tenderness.    Assessment/Plan:  56 y.o. [id number] female for annual exam.   1. Status post TVH BSO as separate procedures. Still with menopausal symptoms hot flashes/night sweats. Had tried OTC products with transient success. Had tried estrogen patches with transient success. Patient is not interested in doing anything at this point but monitoring. 2. Vaginal itching. Recent round of antibiotics for sinusitis. Will cover with Diflucan 150x1 dose. Follow up if symptoms persist or recur. #2 given to have one available if recurrence. 3. Pap smear. No Pap smear done today. Last Pap smear 2012. No history of abnormal Pap smears previously. Discussed current recommendations and options to stop screening altogether she is status post hysterectomy for benign indications versus less frequent screening discussed. We'll readdress on an annual basis. 4. Mammography. Patient up to date and will continue with annual mammography. SBE monthly  reviewed. 5. Colonoscopy 2009. Follow up for their recommended interval. 6. DEXA 09/2009 normal. Plan 5 year follow up. 7. Health maintenance. No blood work done today as it's been recently done at her primary physician's office. Follow up one year, sooner as needed.    Dara Lords MD, 10:55 AM 02/25/2012

## 2012-02-26 LAB — URINALYSIS W MICROSCOPIC + REFLEX CULTURE
Bacteria, UA: NONE SEEN
Bilirubin Urine: NEGATIVE
Glucose, UA: NEGATIVE mg/dL
Ketones, ur: NEGATIVE mg/dL
Protein, ur: NEGATIVE mg/dL
Squamous Epithelial / LPF: NONE SEEN

## 2012-03-03 ENCOUNTER — Ambulatory Visit (HOSPITAL_COMMUNITY)
Admission: RE | Admit: 2012-03-03 | Discharge: 2012-03-03 | Disposition: A | Discharge status: Home / Self Care | Payer: BC Managed Care – PPO | Source: Ambulatory Visit | Attending: Internal Medicine | Admitting: Internal Medicine

## 2012-03-03 DIAGNOSIS — J45909 Unspecified asthma, uncomplicated: Secondary | ICD-10-CM | POA: Insufficient documentation

## 2012-03-03 MED ORDER — ALBUTEROL SULFATE (5 MG/ML) 0.5% IN NEBU
2.5000 mg | INHALATION_SOLUTION | Freq: Once | RESPIRATORY_TRACT | Status: AC
  Administered 2012-03-03: 2.5 mg via RESPIRATORY_TRACT

## 2012-06-09 ENCOUNTER — Encounter: Payer: Self-pay | Admitting: Gynecology

## 2012-07-17 ENCOUNTER — Encounter: Payer: Self-pay | Admitting: Gynecology

## 2012-07-17 ENCOUNTER — Ambulatory Visit (INDEPENDENT_AMBULATORY_CARE_PROVIDER_SITE_OTHER): Payer: BC Managed Care – PPO | Admitting: Gynecology

## 2012-07-17 ENCOUNTER — Ambulatory Visit: Payer: Self-pay | Admitting: Gynecology

## 2012-07-17 DIAGNOSIS — M3500 Sicca syndrome, unspecified: Secondary | ICD-10-CM | POA: Insufficient documentation

## 2012-07-17 DIAGNOSIS — J45909 Unspecified asthma, uncomplicated: Secondary | ICD-10-CM | POA: Insufficient documentation

## 2012-07-17 DIAGNOSIS — M3505 Sjogren syndrome with inflammatory arthritis: Secondary | ICD-10-CM | POA: Insufficient documentation

## 2012-07-17 DIAGNOSIS — N951 Menopausal and female climacteric states: Secondary | ICD-10-CM

## 2012-07-17 MED ORDER — ESTRADIOL 0.1 MG/24HR TD PTTW: 1.0000 | MEDICATED_PATCH | TRANSDERMAL | Status: DC

## 2012-07-17 NOTE — Patient Instructions (Signed)
Start on the estrogen patches as we discussed. Call me if they have any issues with this. Followup with your other physician as far as the results of your thyroid testing.

## 2012-07-17 NOTE — Progress Notes (Signed)
Patient presents complaining of worsening hot flushes and night sweats such that she now is having insomnia. Has been battling this over the last several years. Had been on estrogen patches but discontinued these and wishes monitoring her symptoms. She just had blood work through her primary physician's office to include a thyroid panel. She is being followed for Sjogren's syndrome. I again reviewed HRT with her to include the risks benefits and the WHI study. The risk of stroke heart attack DVT as well as possible breast cancer risk reviewed. Patient wants to go ahead and reinitiate her estradiol patches and I prescribed Minivelle 0.1 mg. I gave her 2 week sample course and we'll see how she does with this. She'll call if she has any issues.

## 2012-12-15 ENCOUNTER — Encounter: Payer: Self-pay | Admitting: Gynecology

## 2013-08-29 ENCOUNTER — Other Ambulatory Visit: Payer: Self-pay | Admitting: Gynecology

## 2013-10-10 ENCOUNTER — Encounter: Payer: Self-pay | Admitting: Gynecology

## 2013-10-10 ENCOUNTER — Ambulatory Visit (INDEPENDENT_AMBULATORY_CARE_PROVIDER_SITE_OTHER): Payer: BC Managed Care – PPO | Admitting: Gynecology

## 2013-10-10 DIAGNOSIS — M545 Low back pain, unspecified: Secondary | ICD-10-CM

## 2013-10-10 DIAGNOSIS — N898 Other specified noninflammatory disorders of vagina: Secondary | ICD-10-CM

## 2013-10-10 DIAGNOSIS — N766 Ulceration of vulva: Secondary | ICD-10-CM

## 2013-10-10 DIAGNOSIS — N899 Noninflammatory disorder of vagina, unspecified: Secondary | ICD-10-CM

## 2013-10-10 LAB — URINALYSIS W MICROSCOPIC + REFLEX CULTURE
Bilirubin Urine: NEGATIVE
CRYSTALS: NONE SEEN
Casts: NONE SEEN
Glucose, UA: NEGATIVE mg/dL
KETONES UR: NEGATIVE mg/dL
Leukocytes, UA: NEGATIVE
NITRITE: NEGATIVE
PROTEIN: 30 mg/dL — AB
UROBILINOGEN UA: 0.2 mg/dL (ref 0.0–1.0)
pH: 5.5 (ref 5.0–8.0)

## 2013-10-10 LAB — WET PREP FOR TRICH, YEAST, CLUE
CLUE CELLS WET PREP: NONE SEEN
Trich, Wet Prep: NONE SEEN

## 2013-10-10 MED ORDER — VALACYCLOVIR HCL 500 MG PO TABS: 500.0000 mg | ORAL_TABLET | Freq: Every day | ORAL | Status: DC

## 2013-10-10 MED ORDER — FLUCONAZOLE 200 MG PO TABS: 200.0000 mg | ORAL_TABLET | Freq: Every day | ORAL | Status: DC

## 2013-10-10 NOTE — Patient Instructions (Signed)
Take the Diflucan tablet daily for 3 days. Followup if the vaginal discharge or irritation continues. Take the Valtrex twice daily for several days and then daily over the next several months to see if we cannot suppress a recurrence of the probable herpes. Office will call you with the herpes culture results. Followup for your annual exam as scheduled.

## 2013-10-10 NOTE — Progress Notes (Signed)
[place]rose 05-23-1955 [id number]        58 y.o.  [id number] presents complaining of vaginal irritation. Was recently treated with antibiotics and developed a yeast infection. Took a Diflucan but still thinks her symptoms are persisting. Also having a lot of discomfort left lower labia where she has a stinging sensation. No said she gets this a lot at times when she gets yeast. Also just a generalized discomfort in her vulvar region. No urinary symptoms such as frequency dysuria or urgency.  Past medical history,surgical history, problem list, medications, allergies, family history and social history were all reviewed and documented in the EPIC chart.  Directed ROS with pertinent positives and negatives documented in the history of present illness/assessment and plan.  Exam: Kim assistant General appearance:  Normal Spine: Straight without deformity no CVA tenderness. Abdomen: Soft nontender without masses guarding rebound Pelvic: External BUS vagina with small cluster of superficial ulcers left lower labia majora with surrounding mild swelling consistent with HSV. No inguinal adenopathy or discomfort. Slight white vaginal discharge. Bimanual without masses or tenderness   Assessment/Plan:  58 y.o. [id number] with: 1. Yeast vaginitis. Wet prep and exam is positive for yeast. Will treat with Diflucan 200 mg daily x3 days. 2. HSV suspect. PCR done over the ulcerative area. Given history of recurrence of the past years strongly suspicious for HSV which I discussed with her. We'll start with the Valtrex 500 mg twice a day over the next several days and then daily with 3 refills provided to suppress her through the summer initial stop in the fall and we'll see how she does. She will followup for the PCR results. If negative I still strongly suspect that this is HSV which I reviewed with her. She does relate having lesions years ago which certainly fits. Patient has an annual exam scheduled in several  weeks and will followup for this.   Note: This document was prepared with digital dictation and possible smart phrase technology. Any transcriptional errors that result from this process are unintentional.   Anastasio Auerbach MD, 12:19 PM 10/10/2013

## 2013-10-12 LAB — URINE CULTURE: Colony Count: 30000

## 2013-10-12 LAB — HERPES SIMPLEX VIRUS CULTURE: Organism ID, Bacteria: NOT DETECTED

## 2013-10-16 ENCOUNTER — Other Ambulatory Visit: Payer: Self-pay | Admitting: Gynecology

## 2013-10-16 MED ORDER — ESTRADIOL 0.1 MG/24HR TD PTTW: MEDICATED_PATCH | TRANSDERMAL | Status: DC

## 2013-11-08 ENCOUNTER — Ambulatory Visit (INDEPENDENT_AMBULATORY_CARE_PROVIDER_SITE_OTHER): Payer: BC Managed Care – PPO | Admitting: Gynecology

## 2013-11-08 ENCOUNTER — Encounter: Payer: Self-pay | Admitting: Gynecology

## 2013-11-08 ENCOUNTER — Other Ambulatory Visit (HOSPITAL_COMMUNITY)
Admission: RE | Admit: 2013-11-08 | Discharge: 2013-11-08 | Disposition: A | Discharge status: Home / Self Care | Payer: BC Managed Care – PPO | Source: Ambulatory Visit | Attending: Gynecology | Admitting: Gynecology

## 2013-11-08 VITALS — BP 118/76 | Ht 67.0 in | Wt 216.0 lb

## 2013-11-08 DIAGNOSIS — Z01419 Encounter for gynecological examination (general) (routine) without abnormal findings: Secondary | ICD-10-CM

## 2013-11-08 DIAGNOSIS — B009 Herpesviral infection, unspecified: Secondary | ICD-10-CM

## 2013-11-08 DIAGNOSIS — B373 Candidiasis of vulva and vagina: Secondary | ICD-10-CM

## 2013-11-08 DIAGNOSIS — B3731 Acute candidiasis of vulva and vagina: Secondary | ICD-10-CM

## 2013-11-08 DIAGNOSIS — A609 Anogenital herpesviral infection, unspecified: Secondary | ICD-10-CM

## 2013-11-08 DIAGNOSIS — Z79890 Hormone replacement therapy (postmenopausal): Secondary | ICD-10-CM

## 2013-11-08 LAB — WET PREP FOR TRICH, YEAST, CLUE
Clue Cells Wet Prep HPF POC: NONE SEEN
TRICH WET PREP: NONE SEEN
WBC, Wet Prep HPF POC: NONE SEEN

## 2013-11-08 MED ORDER — ESTRADIOL 0.1 MG/24HR TD PTTW: MEDICATED_PATCH | TRANSDERMAL | Status: DC

## 2013-11-08 MED ORDER — FLUCONAZOLE 150 MG PO TABS: 150.0000 mg | ORAL_TABLET | Freq: Once | ORAL | Status: DC

## 2013-11-08 NOTE — Addendum Note (Signed)
Addended by: Nelva Nay on: 11/08/2013 11:58 AM   Modules accepted: Orders

## 2013-11-08 NOTE — Progress Notes (Signed)
[name] December 25, 1955 [id number]        58 y.o.  [id number] for annual exam.  Several issues noted below.  Past medical history,surgical history, problem list, medications, allergies, family history and social history were all reviewed and documented as reviewed in the EPIC chart.  ROS:  12 system ROS performed with pertinent positives and negatives included in the history, assessment and plan.   Additional significant findings :  None   Exam: Kim Counsellor Vitals:   11/08/13 1104  BP: 118/76  Height: 5\' 7"  (1.702 m)  Weight: 216 lb (97.977 kg)   General appearance:  Normal affect, orientation and appearance. Skin: Grossly normal HEENT: Without gross lesions.  No cervical or supraclavicular adenopathy. Thyroid normal.  Lungs:  Clear without wheezing, rales or rhonchi Cardiac: RR, without RMG Abdominal:  Soft, nontender, without masses, guarding, rebound, organomegaly or hernia Breasts:  Examined lying and sitting without masses, retractions, discharge or axillary adenopathy. Pelvic:  Ext/BUS/vagina normal with mild atrophic changes. Pap of cuff done  Adnexa  Without masses or tenderness    Anus and perineum  Normal   Rectovaginal  Normal sphincter tone without palpated masses or tenderness.    Assessment/Plan:  58 y.o. [id number] female for annual exam.   1. History of TVH posterior repair 2006. Laparoscopic LSO for serous cystadenoma 2004. RSO 2007 for TOA. Onset of significant menopausal symptoms 2014 started on Vivelle 0.1 mg patch. Doing well with this with good control of her hot flushes night sweats and sleep disturbances. Patient wants to continue.  I again reviewed the whole issue of HRT with her to include the WHI study with increased risk of stroke, heart attack, DVT and breast cancer. The ACOG and NAMS statements for lowest dose for the shortest period of time reviewed. Transdermal versus oral first-pass effect benefit discussed.  Refill x1 year provided. 2. Slight  vaginal symptoms the patient thinks she is developing yeast. Wet prep done and is positive for yeast. Does have a history of recurrent yeast infections previously. Diflucan 150 mg #42 refills provided to use when necessary. 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 Is having an occasional outbreak for which she treats intermittently with Valtrex daily for one week and it goes away. She has a supply at home but will call if she needs more. 4. Pap smear 2012. Pap of cuff done today. No history of significant abnormal Pap smears. Status post hysterectomy for benign indications. Options to stop screening altogether reviewed per current screening guidelines. At this point she prefers less frequent screening. 5. Mammography 12/2012. Reminded patient to schedule this fall. SBE monthly reviewed. 6. DEXA 2011 normal. Will plan repeat at age 66. Increase calcium vitamin D reviewed. 7. Colonoscopy 2009 with reported recommended repeat interval 10 years. 8. Health maintenance. No routine blood work done as this is done through her primary physician's office. Followup one year, sooner as needed.   Note: This document was prepared with digital dictation and possible smart phrase technology. Any transcriptional errors that result from this process are unintentional.   Anastasio Auerbach MD, 11:44 AM 11/08/2013

## 2013-11-08 NOTE — Patient Instructions (Signed)
Continue on the estrogen patch as we discussed. Call me if you have any issues with this. Take the Diflucan as needed for yeast symptoms.  You may obtain a copy of any labs that were done today by logging onto MyChart as outlined in the instructions provided with your AVS (after visit summary). The office will not call with normal lab results but certainly if there are any significant abnormalities then we will contact you.   Health Maintenance, Female A healthy lifestyle and preventative care can promote health and wellness.  Maintain regular health, dental, and eye exams.  Eat a healthy diet. Foods like vegetables, fruits, whole grains, low-fat dairy products, and lean protein foods contain the nutrients you need without too many calories. Decrease your intake of foods high in solid fats, added sugars, and salt. Get information about a proper diet from your caregiver, if necessary.  Regular physical exercise is one of the most important things you can do for your health. Most adults should get at least 150 minutes of moderate-intensity exercise (any activity that increases your heart rate and causes you to sweat) each week. In addition, most adults need muscle-strengthening exercises on 2 or more days a week.   Maintain a healthy weight. The body mass index (BMI) is a screening tool to identify possible weight problems. It provides an estimate of body fat based on height and weight. Your caregiver can help determine your BMI, and can help you achieve or maintain a healthy weight. For adults 20 years and older:  A BMI below 18.5 is considered underweight.  A BMI of 18.5 to 24.9 is normal.  A BMI of 25 to 29.9 is considered overweight.  A BMI of 30 and above is considered obese.  Maintain normal blood lipids and cholesterol by exercising and minimizing your intake of saturated fat. Eat a balanced diet with plenty of fruits and vegetables. Blood tests for lipids and cholesterol should begin  at age 73 and be repeated every 5 years. If your lipid or cholesterol levels are high, you are over 50, or you are a high risk for heart disease, you may need your cholesterol levels checked more frequently.Ongoing high lipid and cholesterol levels should be treated with medicines if diet and exercise are not effective.  If you smoke, find out from your caregiver how to quit. If you do not use tobacco, do not start.  Lung cancer screening is recommended for adults aged 54 80 years who are at high risk for developing lung cancer because of a history of smoking. Yearly low-dose computed tomography (CT) is recommended for people who have at least a 30-pack-year history of smoking and are a current smoker or have quit within the past 15 years. A pack year of smoking is smoking an average of 1 pack of cigarettes a day for 1 year (for example: 1 pack a day for 30 years or 2 packs a day for 15 years). Yearly screening should continue until the smoker has stopped smoking for at least 15 years. Yearly screening should also be stopped for people who develop a health problem that would prevent them from having lung cancer treatment.  If you are pregnant, do not drink alcohol. If you are breastfeeding, be very cautious about drinking alcohol. If you are not pregnant and choose to drink alcohol, do not exceed 1 drink per day. One drink is considered to be 12 ounces (355 mL) of beer, 5 ounces (148 mL) of wine, or 1.5 ounces (44  mL) of liquor.  Avoid use of street drugs. Do not share needles with anyone. Ask for help if you need support or instructions about stopping the use of drugs.  High blood pressure causes heart disease and increases the risk of stroke. Blood pressure should be checked at least every 1 to 2 years. Ongoing high blood pressure should be treated with medicines, if weight loss and exercise are not effective.  If you are 83 to 57 years old, ask your caregiver if you should take aspirin to prevent  strokes.  Diabetes screening involves taking a blood sample to check your fasting blood sugar level. This should be done once every 3 years, after age 28, if you are within normal weight and without risk factors for diabetes. Testing should be considered at a younger age or be carried out more frequently if you are overweight and have at least 1 risk factor for diabetes.  Breast cancer screening is essential preventative care for women. You should practice "breast self-awareness." This means understanding the normal appearance and feel of your breasts and may include breast self-examination. Any changes detected, no matter how small, should be reported to a caregiver. Women in their 19s and 30s should have a clinical breast exam (CBE) by a caregiver as part of a regular health exam every 1 to 3 years. After age 38, women should have a CBE every year. Starting at age 4, women should consider having a mammogram (breast X-ray) every year. Women who have a family history of breast cancer should talk to their caregiver about genetic screening. Women at a high risk of breast cancer should talk to their caregiver about having an MRI and a mammogram every year.  Breast cancer gene (BRCA)-related cancer risk assessment is recommended for women who have family members with BRCA-related cancers. BRCA-related cancers include breast, ovarian, tubal, and peritoneal cancers. Having family members with these cancers may be associated with an increased risk for harmful changes (mutations) in the breast cancer genes BRCA1 and BRCA2. Results of the assessment will determine the need for genetic counseling and BRCA1 and BRCA2 testing.  The Pap test is a screening test for cervical cancer. Women should have a Pap test starting at age 53. Between ages 49 and 39, Pap tests should be repeated every 2 years. Beginning at age 40, you should have a Pap test every 3 years as long as the past 3 Pap tests have been normal. If you had a  hysterectomy for a problem that was not cancer or a condition that could lead to cancer, then you no longer need Pap tests. If you are between ages 2 and 60, and you have had normal Pap tests going back 10 years, you no longer need Pap tests. If you have had past treatment for cervical cancer or a condition that could lead to cancer, you need Pap tests and screening for cancer for at least 20 years after your treatment. If Pap tests have been discontinued, risk factors (such as a new sexual partner) need to be reassessed to determine if screening should be resumed. Some women have medical problems that increase the chance of getting cervical cancer. In these cases, your caregiver may recommend more frequent screening and Pap tests.  The human papillomavirus (HPV) test is an additional test that may be used for cervical cancer screening. The HPV test looks for the virus that can cause the cell changes on the cervix. The cells collected during the Pap test can be  tested for HPV. The HPV test could be used to screen women aged 33 years and older, and should be used in women of any age who have unclear Pap test results. After the age of 35, women should have HPV testing at the same frequency as a Pap test.  Colorectal cancer can be detected and often prevented. Most routine colorectal cancer screening begins at the age of 107 and continues through age 56. However, your caregiver may recommend screening at an earlier age if you have risk factors for colon cancer. On a yearly basis, your caregiver may provide home test kits to check for hidden blood in the stool. Use of a small camera at the end of a tube, to directly examine the colon (sigmoidoscopy or colonoscopy), can detect the earliest forms of colorectal cancer. Talk to your caregiver about this at age 24, when routine screening begins. Direct examination of the colon should be repeated every 5 to 10 years through age 11, unless early forms of pre-cancerous  polyps or small growths are found.  Hepatitis C blood testing is recommended for all people born from [birth year] through 1964 and any individual with known risks for hepatitis C.  Practice safe sex. Use condoms and avoid high-risk sexual practices to reduce the spread of sexually transmitted infections (STIs). Sexually active women aged 46 and younger should be checked for Chlamydia, which is a common sexually transmitted infection. Older women with new or multiple partners should also be tested for Chlamydia. Testing for other STIs is recommended if you are sexually active and at increased risk.  Osteoporosis is a disease in which the bones lose minerals and strength with aging. This can result in serious bone fractures. The risk of osteoporosis can be identified using a bone density scan. Women ages 58 and over and women at risk for fractures or osteoporosis should discuss screening with their caregivers. Ask your caregiver whether you should be taking a calcium supplement or vitamin D to reduce the rate of osteoporosis.  Menopause can be associated with physical symptoms and risks. Hormone replacement therapy is available to decrease symptoms and risks. You should talk to your caregiver about whether hormone replacement therapy is right for you.  Use sunscreen. Apply sunscreen liberally and repeatedly throughout the day. You should seek shade when your shadow is shorter than you. Protect yourself by wearing long sleeves, pants, a wide-brimmed hat, and sunglasses year round, whenever you are outdoors.  Notify your caregiver of new moles or changes in moles, especially if there is a change in shape or color. Also notify your caregiver if a mole is larger than the size of a pencil eraser.  Stay current with your immunizations. Document Released: 10/12/2010 Document Revised: 07/24/2012 Document Reviewed: 10/12/2010 [facility] Patient Information 2014 [place].

## 2013-11-08 NOTE — Addendum Note (Signed)
Addended by: Nelva Nay on: 11/08/2013 12:22 PM   Modules accepted: Orders

## 2013-11-13 ENCOUNTER — Encounter: Payer: Self-pay | Admitting: Gynecology

## 2013-11-13 LAB — CYTOLOGY - PAP

## 2014-02-11 ENCOUNTER — Encounter: Payer: Self-pay | Admitting: Gynecology

## 2017-02-17 ENCOUNTER — Other Ambulatory Visit: Payer: Self-pay | Admitting: Surgery

## 2017-02-17 DIAGNOSIS — E042 Nontoxic multinodular goiter: Secondary | ICD-10-CM

## 2017-03-02 ENCOUNTER — Other Ambulatory Visit: Payer: Self-pay

## 2017-04-01 ENCOUNTER — Ambulatory Visit
Admission: RE | Admit: 2017-04-01 | Discharge: 2017-04-01 | Disposition: A | Discharge status: Home / Self Care | Payer: BLUE CROSS/BLUE SHIELD | Source: Ambulatory Visit | Attending: Surgery | Admitting: Surgery

## 2017-04-01 DIAGNOSIS — E042 Nontoxic multinodular goiter: Secondary | ICD-10-CM

## 2017-04-12 HISTORY — PX: LEFT HEART CATH AND CORONARY ANGIOGRAPHY: CATH118249

## 2017-08-31 DIAGNOSIS — R7303 Prediabetes: Secondary | ICD-10-CM | POA: Insufficient documentation

## 2017-08-31 DIAGNOSIS — E049 Nontoxic goiter, unspecified: Secondary | ICD-10-CM | POA: Insufficient documentation

## 2018-01-10 HISTORY — PX: SHOULDER SURGERY: SHX246

## 2018-04-12 HISTORY — PX: LEFT HEART CATH AND CORONARY ANGIOGRAPHY: CATH118249

## 2018-05-05 DIAGNOSIS — Z91018 Allergy to other foods: Secondary | ICD-10-CM | POA: Insufficient documentation

## 2018-06-20 ENCOUNTER — Other Ambulatory Visit: Payer: Self-pay | Admitting: Surgery

## 2018-06-20 DIAGNOSIS — E042 Nontoxic multinodular goiter: Secondary | ICD-10-CM

## 2018-07-20 ENCOUNTER — Other Ambulatory Visit: Payer: BLUE CROSS/BLUE SHIELD

## 2019-02-03 IMAGING — US US THYROID
1 series · 13 of 25 positions shown · non-contrast
Comparison: None.

CLINICAL DATA: Thyroid nodules.

EXAM:
THYROID ULTRASOUND
TECHNIQUE: Ultrasound examination of the thyroid gland and adjacent soft
tissues was performed.

[Series 1: us thyroid · 0.08mm/px · 13 of 69 slices shown]
[im 1/69]
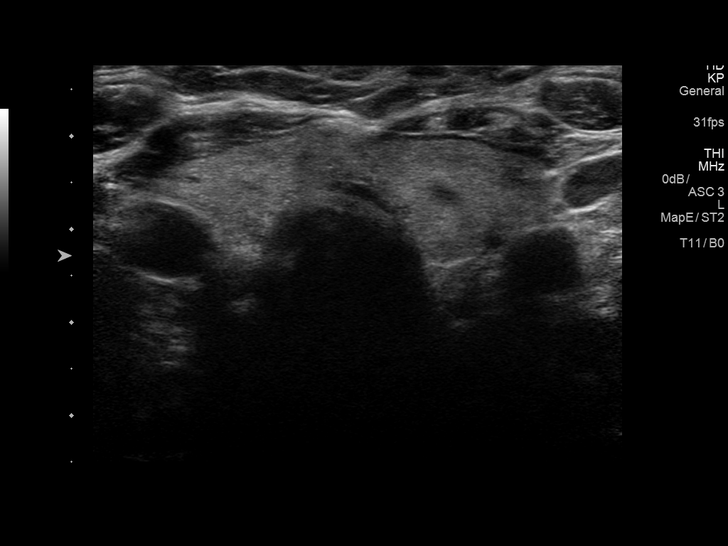
[im 6/69]
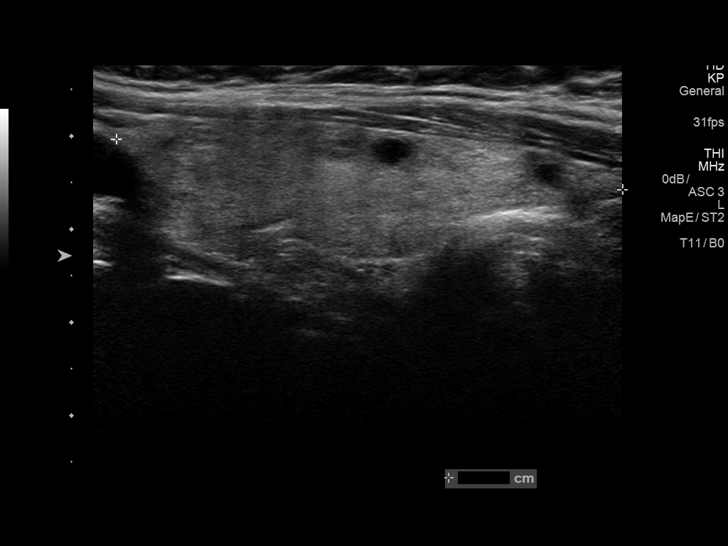
[im 12/69]
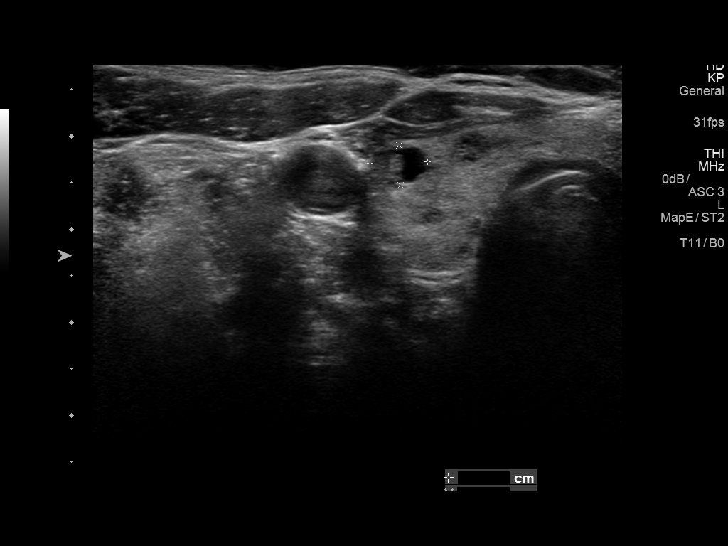
[im 18/69]
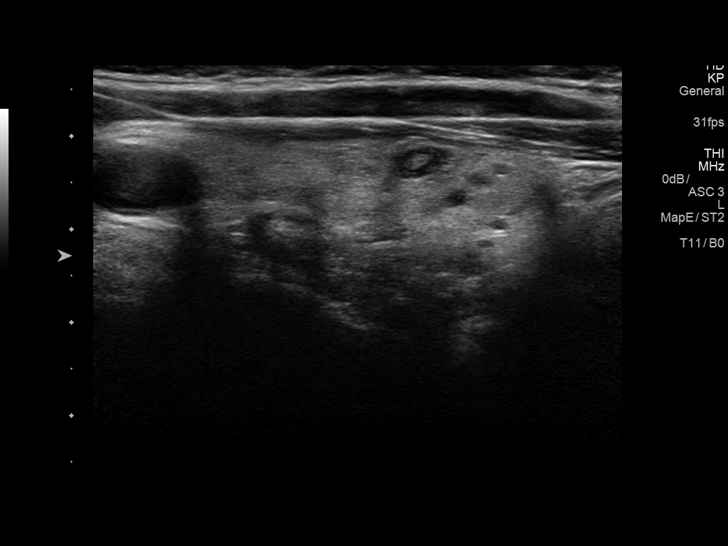
[im 23/69]
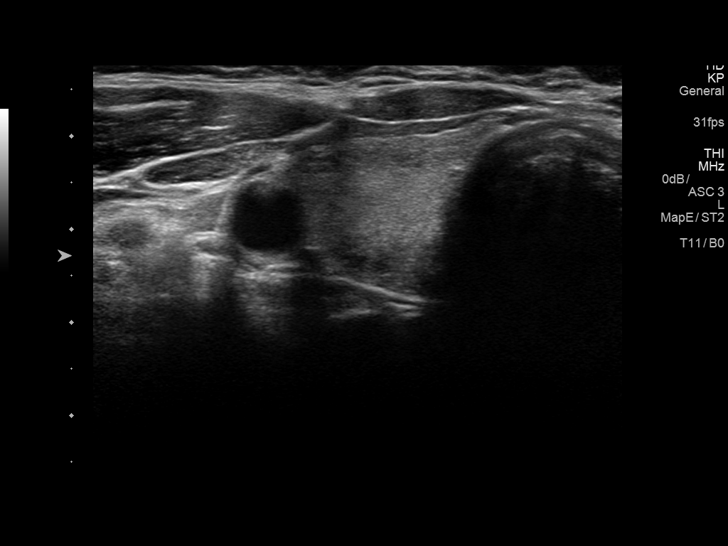
[im 29/69]
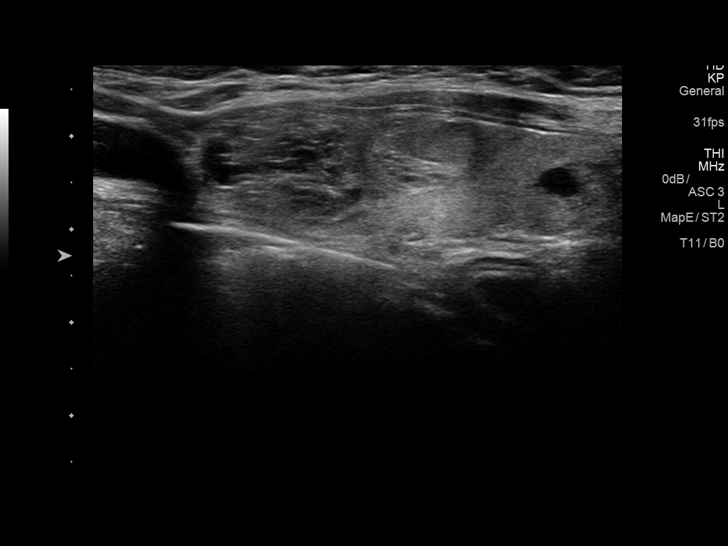
[im 35/69]
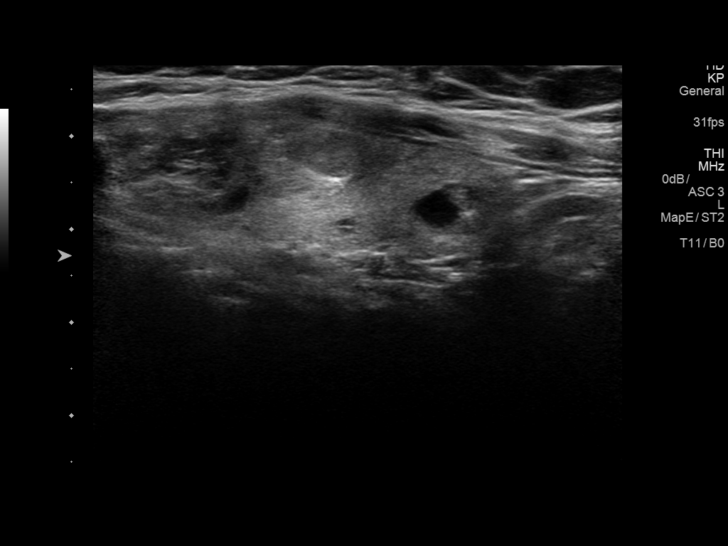
[im 40/69]
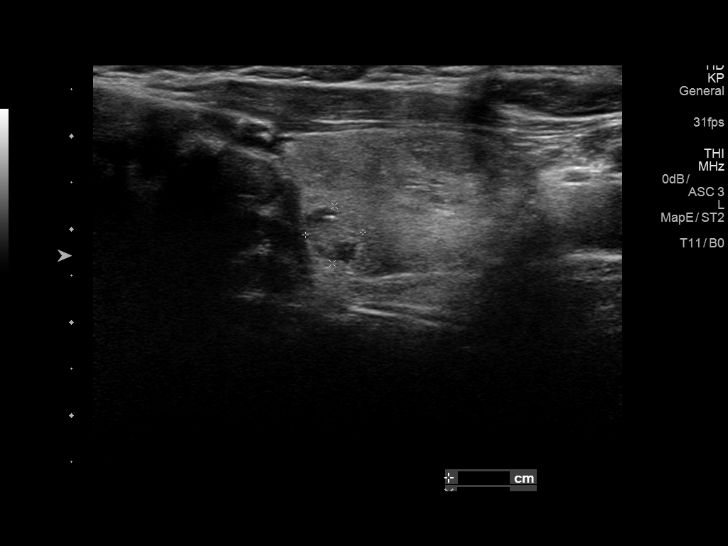
[im 46/69]
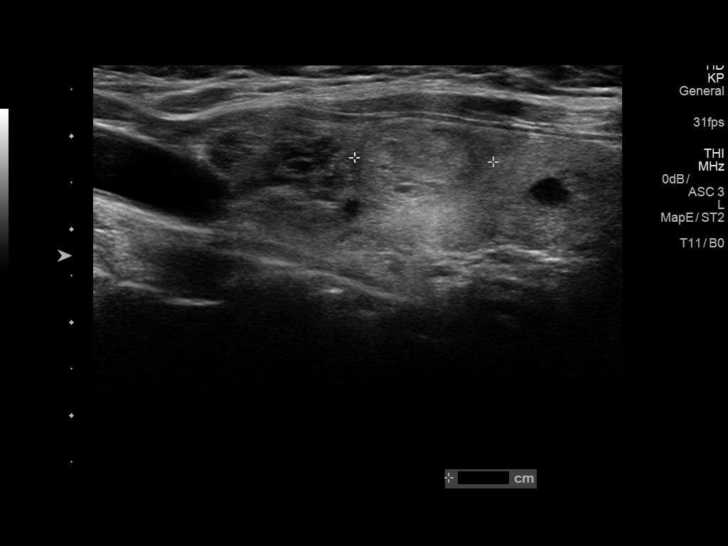
[im 52/69]
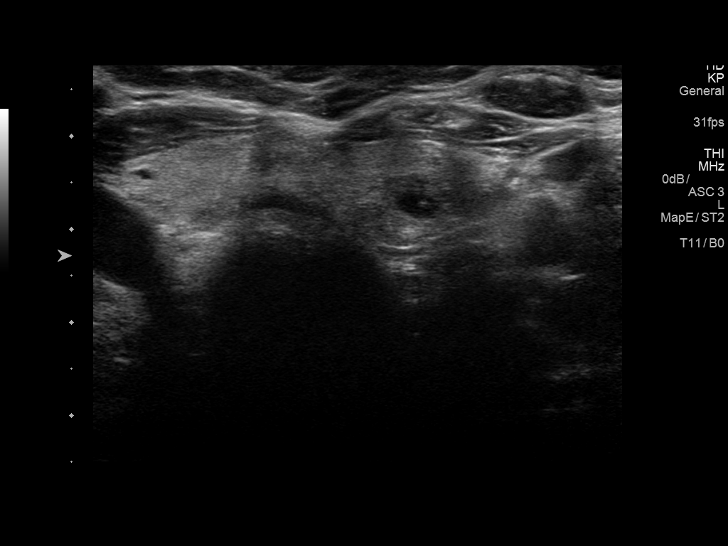
[im 57/69]
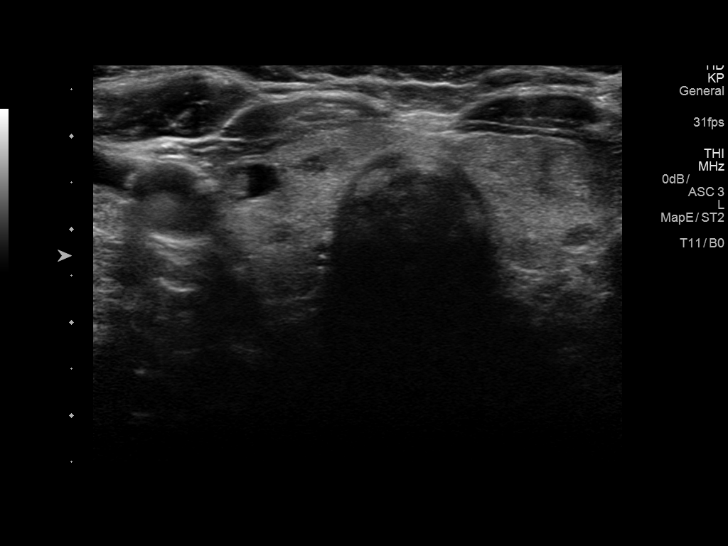
[im 63/69]
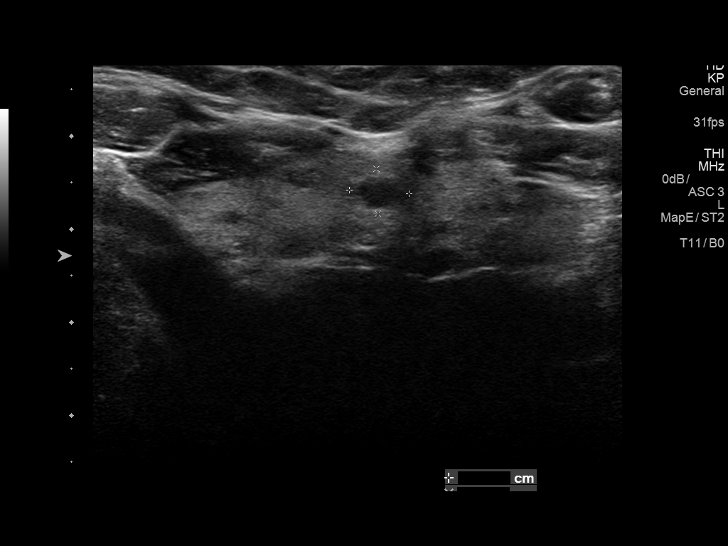
[im 69/69]
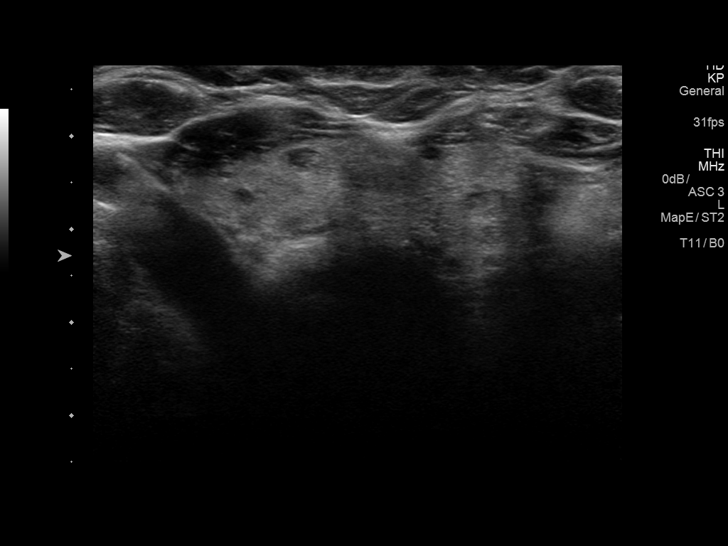

[13 of 25 positions shown; findings below may reference images not displayed]

FINDINGS: Parenchymal Echotexture: Moderately heterogenous

Isthmus: 0.7 cm

Right lobe: 5.5 x 1.6 x 1.7 cm

Left lobe: 5.1 x 1.8 x 2.1 cm

_________________________________________________________

Estimated total number of nodules >/= 1 cm: 2

Number of spongiform nodules >/=  2 cm not described below (TR1): 0

Number of mixed cystic and solid nodules >/= 1.5 cm not described
below (TR2): 0

_________________________________________________________

Mixed cystic and solid nodules in the right thyroid lobe, largest
measuring 0.9 cm.

Nodule # 1:

Location: Left; Superior

Maximum size: 1.7 cm; Other 2 dimensions: 1.1 x 1.3 cm

Composition: spongiform (0)

Echogenicity: isoechoic (1)

Shape: not taller-than-wide (0)

Margins: ill-defined (0)

Echogenic foci: none (0)

ACR TI-RADS total points: 1.

ACR TI-RADS risk category: TR1 (0-1 points).

ACR TI-RADS recommendations:

This nodule does NOT meet TI-RADS criteria for biopsy or dedicated
follow-up.

_________________________________________________________

Nodule # 2:

Location: Left; Mid

Maximum size: 1.5 cm; Other 2 dimensions: 0.9 x 1.4 cm

Composition: solid/almost completely solid (2)

Echogenicity: isoechoic (1)

Shape: not taller-than-wide (0)

Margins: ill-defined (0)

Echogenic foci: none (0)

ACR TI-RADS total points: 3.

ACR TI-RADS risk category: TR3 (3 points).

ACR TI-RADS recommendations:

*Given size (>/= 1.5 - 2.4 cm) and appearance, a follow-up
ultrasound in 1 year should be considered based on TI-RADS criteria.

_________________________________________________________

Additional subcentimeter nodules in left thyroid lobe. Mixed solid
and cystic nodule at the isthmus measuring up to 0.8 cm.
IMPRESSION: Multiple thyroid nodules.

Nodule #2 in the mid left thyroid lobe meets criteria for 1 year
follow-up.

The above is in keeping with the ACR TI-RADS recommendations - [HOSPITAL] 9295;[DATE].

## 2019-08-17 ENCOUNTER — Other Ambulatory Visit: Payer: Self-pay | Admitting: Surgery

## 2019-08-17 DIAGNOSIS — E042 Nontoxic multinodular goiter: Secondary | ICD-10-CM

## 2019-08-30 ENCOUNTER — Ambulatory Visit
Admission: RE | Admit: 2019-08-30 | Discharge: 2019-08-30 | Disposition: A | Discharge status: Home / Self Care | Payer: BLUE CROSS/BLUE SHIELD | Source: Ambulatory Visit | Attending: Surgery | Admitting: Surgery

## 2019-08-30 ENCOUNTER — Ambulatory Visit
Admission: RE | Admit: 2019-08-30 | Discharge: 2019-08-30 | Disposition: A | Discharge status: Home / Self Care | Payer: BC Managed Care – PPO | Source: Ambulatory Visit | Attending: Surgery | Admitting: Surgery

## 2019-08-30 DIAGNOSIS — E042 Nontoxic multinodular goiter: Secondary | ICD-10-CM

## 2019-11-24 DIAGNOSIS — I6529 Occlusion and stenosis of unspecified carotid artery: Secondary | ICD-10-CM | POA: Insufficient documentation

## 2019-11-24 DIAGNOSIS — M2550 Pain in unspecified joint: Secondary | ICD-10-CM | POA: Insufficient documentation

## 2019-12-19 DIAGNOSIS — M109 Gout, unspecified: Secondary | ICD-10-CM | POA: Insufficient documentation

## 2020-02-21 DIAGNOSIS — B009 Herpesviral infection, unspecified: Secondary | ICD-10-CM | POA: Insufficient documentation

## 2020-04-12 DIAGNOSIS — M792 Neuralgia and neuritis, unspecified: Secondary | ICD-10-CM | POA: Insufficient documentation

## 2020-04-16 DIAGNOSIS — U071 COVID-19: Secondary | ICD-10-CM | POA: Insufficient documentation

## 2020-07-29 ENCOUNTER — Other Ambulatory Visit: Payer: Self-pay | Admitting: Surgery

## 2020-07-29 DIAGNOSIS — E042 Nontoxic multinodular goiter: Secondary | ICD-10-CM

## 2020-08-05 ENCOUNTER — Ambulatory Visit
Admission: RE | Admit: 2020-08-05 | Discharge: 2020-08-05 | Disposition: A | Discharge status: Home / Self Care | Payer: BC Managed Care – PPO | Source: Ambulatory Visit | Attending: Surgery | Admitting: Surgery

## 2020-08-05 ENCOUNTER — Other Ambulatory Visit: Payer: Self-pay

## 2020-08-05 DIAGNOSIS — E042 Nontoxic multinodular goiter: Secondary | ICD-10-CM

## 2020-12-04 DIAGNOSIS — R42 Dizziness and giddiness: Secondary | ICD-10-CM | POA: Insufficient documentation

## 2021-04-20 DIAGNOSIS — R053 Chronic cough: Secondary | ICD-10-CM | POA: Insufficient documentation

## 2021-07-27 ENCOUNTER — Ambulatory Visit: Admit: 2021-07-27 | Discharge status: Absent without leave | Payer: BC Managed Care – PPO

## 2021-07-28 ENCOUNTER — Other Ambulatory Visit: Payer: Self-pay

## 2021-07-28 ENCOUNTER — Ambulatory Visit
Admission: EM | Admit: 2021-07-28 | Discharge: 2021-07-28 | Disposition: A | Discharge status: Home / Self Care | Payer: Medicare Other | Attending: Internal Medicine | Admitting: Internal Medicine

## 2021-07-28 VITALS — BP 131/86 | HR 98 | Temp 98.3°F | Resp 18

## 2021-07-28 DIAGNOSIS — U071 COVID-19: Secondary | ICD-10-CM

## 2021-07-28 MED ORDER — PREDNISONE 10 MG (21) PO TBPK: ORAL_TABLET | Freq: Every day | ORAL | 0 refills | Status: DC

## 2021-07-28 NOTE — ED Triage Notes (Signed)
Pt here for cough and congestion; pt tested positive for covid yesterday; pt sts hx of asthma and sx worse despite using inhaler and neb ?

## 2021-07-28 NOTE — ED Provider Notes (Signed)
?[place] ? ? ? ?CSN: [id number] ?Arrival date & time: 07/28/21  1540 ? ? ?  ? ?History   ?Chief Complaint ?Chief Complaint  ?Patient presents with  ? Chills  ?  Sinus infection, chills, laryngitis - Entered by patient  ? Appointment  ?  1000  ? ? ?HPI ?[name] is a 66 y.o. female.  ? ?Patient presents for further evaluation after testing positive for COVID-19 with an at home test yesterday.  Patient reports that symptoms started approximately 3 days ago and include nasal congestion and cough.  Patient does have history of asthma and has been having to use her albuterol inhaler and nebulizer treatments more often with minimal improvement.  Denies chest pain, sore throat, ear pain, nausea, vomiting, diarrhea, abdominal pain.  Denies any known fevers.  Patient reports that her family member who lives with her also has COVID. ? ? ? ?Past Medical History:  ?Diagnosis Date  ? Asthma   ? HSV-2 infection 2011  ? OA (osteoarthritis)   ? Reflux   ? Sjogren's syndrome ([place])   ? ? ?Patient Active Problem List  ? Diagnosis Date Noted  ? Reflux   ? Asthma   ? Sjogren's syndrome ([place])   ? ? ?Past Surgical History:  ?Procedure Laterality Date  ? APPENDECTOMY    ? BACK SURGERY  03/2010  ? BREAST SURGERY    ? BIOPSY  ? NECK SURGERY    ? PELVIC LAPAROSCOPY  [id number]  ? PILONIDAL CYST EXCISION    ? SALPINGOOPHORECTOMY  7/04  ? SCOPE LSO-LEFT SEROUS CYSTADENOMA  ? SALPINGOOPHORECTOMY  01/07  ? RSO/TOA  ? TONSILLECTOMY AND ADENOIDECTOMY    ? TUBAL LIGATION    ? VAGINAL HYSTERECTOMY  10/06  ? TVH, POSTERIOR REPAIR  ? ? ?OB History   ? ? Gravida  ?5  ? Para  ?2  ? Term  ?   ? Preterm  ?   ? AB  ?3  ? Living  ?2  ?  ? ? SAB  ?3  ? IAB  ?   ? Ectopic  ?   ? Multiple  ?   ? Live Births  ?   ?   ?  ?  ? ? ? ?Home Medications   ? ?Prior to Admission medications   ?Medication Sig Start Date End Date Taking? Authorizing Provider  ?predniSONE (STERAPRED UNI-PAK 21 TAB) 10 MG (21) TBPK tablet Take by mouth daily. Take 6 tabs  by mouth daily  for 2 days, then 5 tabs for 2 days, then 4 tabs for 2 days, then 3 tabs for 2 days, 2 tabs for 2 days, then 1 tab by mouth daily for 2 days 07/28/21  Yes Teodora Medici, FNP  ?acetaminophen (TYLENOL) 650 MG CR tablet Take 650 mg by mouth as needed.      [provider]  ?ALBUTEROL IN Inhale into the lungs as needed.      [provider]  ?BIOTIN PO Take by mouth.    [provider]  ?Cetirizine HCl (ZYRTEC PO) Take 10 mg by mouth.     [provider]  ?Cholecalciferol (VITAMIN D PO) Take by mouth. TAKES 2000     [provider]  ?cyclobenzaprine (FLEXERIL) 10 MG tablet Take 10 mg by mouth 3 (three) times daily as needed.    [provider]  ?estradiol (VIVELLE-DOT) 0.1 MG/24HR patch APPLY ONE PATCH TOPICALLY TWICE A WEEK 11/08/13   Fontaine, Timothy P,  MD  ?fluconazole (DIFLUCAN) 150 MG tablet Take 1 tablet (150 mg total) by mouth once. 11/08/13   Fontaine, Belinda Block, MD  ?meloxicam (MOBIC) 7.5 MG tablet Take 7.5 mg by mouth daily.    [provider]  ?Multiple Vitamins-Iron (MULTIVITAMIN/IRON PO) Take by mouth.      [provider]  ?Omeprazole (PRILOSEC PO) Take 20 mg by mouth.     [provider]  ?triamcinolone (NASACORT) 55 MCG/ACT nasal inhaler Place 2 sprays into the nose as needed.      [provider]  ?valACYclovir (VALTREX) 500 MG tablet Take 1 tablet (500 mg total) by mouth daily. 10/10/13   Fontaine, Belinda Block, MD  ? ? ?Family History ?Family History  ?Problem Relation Age of Onset  ? Heart disease Father   ? Hypertension Father   ? Cancer Father   ?     Prostate and lung  ? Breast cancer Maternal Aunt   ?     LATE 50'S  ? Ovarian cancer Maternal Aunt   ? Cancer Paternal Grandmother   ?     COLON CA  ? ? ?Social History ?Social History  ? ?Tobacco Use  ? Smoking status: Former  ? Smokeless tobacco: Never  ?Substance Use Topics  ? Alcohol use: Yes  ?  Comment: Rare  ? Drug use: No  ? ? ? ?Allergies    ?Codeine, Duratuss [phenylephrine-guaifenesin], Erythromycin, Hydrocodone-acetaminophen, Iodine, Latex, Oxycodone, Peanuts [nuts], Penicillins, Poultry meal, Shellfish allergy, and Vicodin [hydrocodone-acetaminophen] ? ? ?Review of Systems ?Review of Systems ?Per HPI ? ?Physical Exam ?Triage Vital Signs ?ED Triage Vitals  ?Enc Vitals Group  ?   BP 07/28/21 1038 131/86  ?   Pulse Rate 07/28/21 1038 98  ?   Resp 07/28/21 1038 18  ?   Temp 07/28/21 1038 98.3 ?F (36.8 ?C)  ?   Temp Source 07/28/21 1038 Oral  ?   SpO2 07/28/21 1038 95 %  ?   Weight --   ?   Height --   ?   Head Circumference --   ?   Peak Flow --   ?   Pain Score 07/28/21 1039 4  ?   Pain Loc --   ?   Pain Edu? --   ?   Excl. in [place]? --   ? ?No data found. ? ?Updated Vital Signs ?BP 131/86 (BP Location: Left Arm)   Pulse 98   Temp 98.3 ?F (36.8 ?C) (Oral)   Resp 18   SpO2 95%  ? ?Visual Acuity ?Right Eye Distance:   ?Left Eye Distance:   ?Bilateral Distance:   ? ?Right Eye Near:   ?Left Eye Near:    ?Bilateral Near:    ? ?Physical Exam ?Constitutional:   ?   General: She is not in acute distress. ?   Appearance: Normal appearance. She is not toxic-appearing or diaphoretic.  ?HENT:  ?   Head: Normocephalic and atraumatic.  ?   Right Ear: Tympanic membrane and ear canal normal.  ?   Left Ear: Tympanic membrane and ear canal normal.  ?   Nose: Congestion present.  ?   Mouth/Throat:  ?   Mouth: Mucous membranes are moist.  ?   Pharynx: No posterior oropharyngeal erythema.  ?Eyes:  ?   Extraocular Movements: Extraocular movements intact.  ?   Conjunctiva/sclera: Conjunctivae normal.  ?   Pupils: Pupils are equal, round, and reactive to light.  ?Cardiovascular:  ?   Rate and  Rhythm: Normal rate and regular rhythm.  ?   Pulses: Normal pulses.  ?   Heart sounds: Normal heart sounds.  ?Pulmonary:  ?   Effort: Pulmonary effort is normal. No respiratory distress.  ?   Breath sounds: Normal breath sounds. No stridor. No wheezing, rhonchi or rales.  ?   Comments:  Harsh cough on exam ?Abdominal:  ?   General: Abdomen is flat. Bowel sounds are normal.  ?   Palpations: Abdomen is soft.  ?Musculoskeletal:     ?   General: Normal range of motion.  ?   Cervical back: Normal range of motion.  ?Skin: ?   General: Skin is warm and dry.  ?Neurological:  ?   General: No focal deficit present.  ?   Mental Status: She is alert and oriented to person, place, and time. Mental status is at baseline.  ?Psychiatric:     ?   Mood and Affect: Mood normal.     ?   Behavior: Behavior normal.  ? ? ? ?UC Treatments / Results  ?Labs ?(all labs ordered are listed, but only abnormal results are displayed) ?Labs Reviewed - No data to display ? ?EKG ? ? ?Radiology ?No results found. ? ?Procedures ?Procedures (including critical care time) ? ?Medications Ordered in UC ?Medications - No data to display ? ?Initial Impression / Assessment and Plan / UC Course  ?I have reviewed the triage vital signs and the nursing notes. ? ?Pertinent labs & imaging results that were available during my care of the patient were reviewed by me and considered in my medical decision making (see chart for details). ? ?  ? ?Patient tested positive for COVID-19 with an at home test.  Patient has acute illness in the setting of asthma so will treat with prednisone as patient has taken this previously and has tolerated well. Discussed supportive care and management with patient.  Patient was offered Covid antiviral medications but declined.  Discussed return precautions.  Patient verbalized understanding and was agreeable with plan. ?Final Clinical Impressions(s) / UC Diagnoses  ? ?Final diagnoses:  ?COVID-19  ? ? ? ?Discharge Instructions   ? ?  ?You have been prescribed a prednisone steroid to help alleviate asthma associated with COVID.  I am unable to prescribe guaifenesin or any other cough medication as they are not safe with your daily medications.  Continue Coricidin over-the-counter. ? ? ? ?ED Prescriptions   ? ?  Medication Sig Dispense Auth. Provider  ? predniSONE (STERAPRED UNI-PAK 21 TAB) 10 MG (21) TBPK tablet Take by mouth daily. Take 6 tabs by mouth daily  for 2 days, then 5 tabs for 2 days, then 4 tabs for 2 days, then

## 2021-07-28 NOTE — Discharge Instructions (Addendum)
You have been prescribed a prednisone steroid to help alleviate asthma associated with COVID.  I am unable to prescribe guaifenesin or any other cough medication as they are not safe with your daily medications.  Continue Coricidin over-the-counter. ?

## 2021-10-06 ENCOUNTER — Encounter: Payer: Self-pay | Admitting: Internal Medicine

## 2021-10-06 DIAGNOSIS — H52203 Unspecified astigmatism, bilateral: Secondary | ICD-10-CM | POA: Diagnosis not present

## 2021-10-06 DIAGNOSIS — H16223 Keratoconjunctivitis sicca, not specified as Sjogren's, bilateral: Secondary | ICD-10-CM | POA: Diagnosis not present

## 2021-10-06 DIAGNOSIS — E119 Type 2 diabetes mellitus without complications: Secondary | ICD-10-CM | POA: Diagnosis not present

## 2021-10-06 LAB — HM DIABETES EYE EXAM

## 2021-10-20 ENCOUNTER — Encounter: Payer: Self-pay | Admitting: Internal Medicine

## 2021-10-20 ENCOUNTER — Ambulatory Visit: Payer: Medicare Other | Attending: Internal Medicine | Admitting: Internal Medicine

## 2021-10-20 VITALS — BP 132/84 | HR 64 | Temp 99.0°F | Resp 16 | Ht 67.0 in | Wt 232.0 lb

## 2021-10-20 DIAGNOSIS — Z8619 Personal history of other infectious and parasitic diseases: Secondary | ICD-10-CM

## 2021-10-20 DIAGNOSIS — L729 Follicular cyst of the skin and subcutaneous tissue, unspecified: Secondary | ICD-10-CM | POA: Diagnosis not present

## 2021-10-20 DIAGNOSIS — Z7689 Persons encountering health services in other specified circumstances: Secondary | ICD-10-CM

## 2021-10-20 DIAGNOSIS — Z8739 Personal history of other diseases of the musculoskeletal system and connective tissue: Secondary | ICD-10-CM

## 2021-10-20 DIAGNOSIS — E11649 Type 2 diabetes mellitus with hypoglycemia without coma: Secondary | ICD-10-CM | POA: Diagnosis not present

## 2021-10-20 DIAGNOSIS — I10 Essential (primary) hypertension: Secondary | ICD-10-CM | POA: Diagnosis not present

## 2021-10-20 DIAGNOSIS — M159 Polyosteoarthritis, unspecified: Secondary | ICD-10-CM

## 2021-10-20 DIAGNOSIS — K219 Gastro-esophageal reflux disease without esophagitis: Secondary | ICD-10-CM

## 2021-10-20 DIAGNOSIS — M3500 Sicca syndrome, unspecified: Secondary | ICD-10-CM | POA: Diagnosis not present

## 2021-10-20 DIAGNOSIS — J4520 Mild intermittent asthma, uncomplicated: Secondary | ICD-10-CM

## 2021-10-20 DIAGNOSIS — D229 Melanocytic nevi, unspecified: Secondary | ICD-10-CM | POA: Diagnosis not present

## 2021-10-20 DIAGNOSIS — Z6836 Body mass index (BMI) 36.0-36.9, adult: Secondary | ICD-10-CM

## 2021-10-20 DIAGNOSIS — R300 Dysuria: Secondary | ICD-10-CM

## 2021-10-20 LAB — POCT URINALYSIS DIP (CLINITEK)
Bilirubin, UA: NEGATIVE
Glucose, UA: NEGATIVE mg/dL
Ketones, POC UA: NEGATIVE mg/dL
Leukocytes, UA: NEGATIVE
Nitrite, UA: NEGATIVE
POC PROTEIN,UA: NEGATIVE
Spec Grav, UA: 1.02 (ref 1.010–1.025)
Urobilinogen, UA: 0.2 E.U./dL
pH, UA: 6 (ref 5.0–8.0)

## 2021-10-20 LAB — POCT GLYCOSYLATED HEMOGLOBIN (HGB A1C): HbA1c, POC (controlled diabetic range): 6.5 % (ref 0.0–7.0)

## 2021-10-20 LAB — GLUCOSE, POCT (MANUAL RESULT ENTRY): POC Glucose: 119 mg/dl — AB (ref 70–99)

## 2021-10-20 MED ORDER — HYDROCHLOROTHIAZIDE 25 MG PO TABS: 25.0000 mg | ORAL_TABLET | Freq: Two times a day (BID) | ORAL | 2 refills | Status: DC

## 2021-10-20 MED ORDER — EPINEPHRINE 0.3 MG/0.3ML IJ SOAJ: 0.3000 mg | INTRAMUSCULAR | 1 refills | Status: DC | PRN

## 2021-10-20 MED ORDER — ALBUTEROL SULFATE HFA 108 (90 BASE) MCG/ACT IN AERS: 2.0000 | INHALATION_SPRAY | Freq: Four times a day (QID) | RESPIRATORY_TRACT | 5 refills | Status: DC | PRN

## 2021-10-20 MED ORDER — MELOXICAM 15 MG PO TABS: 15.0000 mg | ORAL_TABLET | Freq: Every day | ORAL | 5 refills | Status: DC

## 2021-10-20 MED ORDER — MONTELUKAST SODIUM 10 MG PO TABS: 10.0000 mg | ORAL_TABLET | Freq: Every day | ORAL | 6 refills | Status: DC

## 2021-10-20 MED ORDER — ALLOPURINOL 100 MG PO TABS: 100.0000 mg | ORAL_TABLET | Freq: Every day | ORAL | 6 refills | Status: DC

## 2021-10-20 MED ORDER — NEBIVOLOL HCL 5 MG PO TABS: 5.0000 mg | ORAL_TABLET | Freq: Every morning | ORAL | 3 refills | Status: DC

## 2021-10-20 MED ORDER — IPRATROPIUM-ALBUTEROL 0.5-2.5 (3) MG/3ML IN SOLN: 3.0000 mL | Freq: Four times a day (QID) | RESPIRATORY_TRACT | 3 refills | Status: DC | PRN

## 2021-10-20 MED ORDER — FREESTYLE LIBRE SENSOR SYSTEM MISC: 12 refills | Status: DC

## 2021-10-20 MED ORDER — DEXLANSOPRAZOLE 60 MG PO CPDR: 1.0000 | DELAYED_RELEASE_CAPSULE | Freq: Every morning | ORAL | 5 refills | Status: DC

## 2021-10-20 MED ORDER — ERGOCALCIFEROL 1.25 MG (50000 UT) PO CAPS: 50000.0000 [IU] | ORAL_CAPSULE | ORAL | 1 refills | Status: DC

## 2021-10-20 MED ORDER — CYCLOBENZAPRINE HCL 10 MG PO TABS: 10.0000 mg | ORAL_TABLET | Freq: Three times a day (TID) | ORAL | 3 refills | Status: DC | PRN

## 2021-10-20 MED ORDER — VALACYCLOVIR HCL 500 MG PO TABS: 500.0000 mg | ORAL_TABLET | Freq: Every day | ORAL | 3 refills | Status: DC

## 2021-10-20 NOTE — Patient Instructions (Signed)
Please note that I will update your medication list and send refills on your medications to [facility] as was discussed today by the end of the day.  Please sign a release for [place] to get your records from your previous PCP.

## 2021-10-20 NOTE — Progress Notes (Unsigned)
Referral to rheumatology  Referral to dermatology- mole on left arm and SQ cyst  Concerns for UTI  Refill on Libre sensor, Epi-Pen  CBG-119  Hg A1C- 6.5

## 2021-10-20 NOTE — Progress Notes (Unsigned)
Patient ID: [name], female    DOB: [birth year]  MRN: [id number]  CC: Presents to establish care  Subjective: [name] is a 66 y.o. female who presents for new patient visit. Her concerns today include:  Patient with history of DM type II, HTN, Sjogren syndrome, Raynaud's phenomenon, OA knees/ankles/shoulders/hips, HSV 2, asthma, gout,  Patient presents to establish care and to get refill on medications.  Previous PCP was Seymour Bars, NP in [facility].  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  Request referral to rheumatology. History of Sjogren's syndrome, gout and OA of multiple joints including knees, ankles, shoulders and hips. Was seeing a rheumatologist in [facility].  On meloxicam 15 mg daily, Flexeril to take at bedtime to help with muscle spasms and tramadol to use as needed.  Takes the tramadol several times a month.  Last month she took it twice.  Does not take every day as she does not want to become hooked on the medicine. -Concerned about her weight and would like to lose weight.  She was going to the gym daily until 1 month ago.  Stopped because her joints were hurting too bad.  HTN: On Bystolic 5 mg daily and HCTZ 25 mg twice a day.  Checks blood pressure regularly.  Recent numbers have been 127/80 and 115/80.  She limits salt in the foods.  DM: Reports initially being diagnosed with prediabetes then diabetes.  A1c improved from 6.7 to 5.4 in August of last year.  Not on any medication for diabetes.  However she experiences significant hypoglycemic episodes especially in the middle of the night.  Blood sugars can drop into the 50s.  She sometimes has to get up twice during the night to chew on glucose tabs.  In the mornings when she wakes up, if she delays getting breakfast, she has hypoglycemic episodes. -She has not seen an endocrinologist for the  hypoglycemia. -Sees Dr. Rayann Heman for nodules on both thyroid x4 years.  Reports being told that after ultrasound this year, if nodules have remained stable, she will need no further work-up.  Asthma: Uses albuterol inhaler and nebulizer DuoNeb treatments occasionally.  Asthma flareups occur during change of season. On Singulair and Zyrtec daily. Requests refill on EpiPen.  Has quite a bit of allergies including allergies to bee sting, shellfish and iodine.  I note meclizine on the medication list.  She takes that occasionally for vertigo.  Has had vertigo twice.  On Valtrex for prophylactic therapy for genital herpes.  Thinks dose she takes is 400 mg BID.  Request referral to dermatology for cyst on her lower back right side that has been there for over a year.  It has increased in size.  Also has a spot on the medial antecubital fossa of the left arm.  It is hard and it itches.  Has another spot below the bra strap on the right posterior thorax.  Reported to my RN that she was having some discomfort with urination and requested to have urine checked.  GERD: Symptoms stable on Dexilant 60 mg daily.  HM: Last mammogram was in February of this year.  Had a hysterectomy for noncancerous reason.  She was up-to-date with Pap/cervical cancer screening after that point. Patient Active Problem List   Diagnosis Date Noted   Reflux    Asthma    Sjogren's syndrome ([place])  Current Outpatient Medications on File Prior to Visit  Medication Sig Dispense Refill   acetaminophen (TYLENOL) 650 MG CR tablet Take 650 mg by mouth as needed.       BIOTIN PO Take by mouth.     Cetirizine HCl (ZYRTEC PO) Take 10 mg by mouth.      Cholecalciferol (VITAMIN D PO) Take by mouth. TAKES 2000      Multiple Vitamins-Iron (MULTIVITAMIN/IRON PO) Take by mouth.       traMADol (ULTRAM) 50 MG tablet Take 50 mg by mouth every 6 (six) hours as needed.     triamcinolone (NASACORT) 55 MCG/ACT nasal inhaler Place 2  sprays into the nose as needed.       No current facility-administered medications on file prior to visit.    Allergies  Allergen Reactions   Codeine Swelling   Duratuss [Phenylephrine-Guaifenesin] Hives   Erythromycin Swelling    ALLERGIC TO MYCINS   Hydrocodone-Acetaminophen Swelling   Iodine Swelling   Latex Swelling   Oxycodone Swelling   Peanuts [Nuts] Swelling   Penicillins Swelling   Poultry Meal Swelling   Shellfish Allergy Swelling   Vicodin [Hydrocodone-Acetaminophen] Swelling    Social History   Socioeconomic History   Marital status: Single    Spouse name: Not on file   Number of children: Not on file   Years of education: Not on file   Highest education level: Not on file  Occupational History   Not on file  Tobacco Use   Smoking status: Former   Smokeless tobacco: Never  Substance and Sexual Activity   Alcohol use: Yes    Comment: Rare   Drug use: No   Sexual activity: Never    Birth control/protection: Post-menopausal  Other Topics Concern   Not on file  Social History Narrative   Not on file   Social Determinants of Health   Financial Resource Strain: Not on file  Food Insecurity: Not on file  Transportation Needs: Not on file  Physical Activity: Not on file  Stress: Not on file  Social Connections: Not on file  Intimate Partner Violence: Not on file    Family History  Problem Relation Age of Onset   Heart disease Father    Hypertension Father    Cancer Father        Prostate and lung   Breast cancer Maternal Aunt        LATE 50'S   Ovarian cancer Maternal Aunt    Cancer Paternal Grandmother        COLON CA    Past Surgical History:  Procedure Laterality Date   APPENDECTOMY     BACK SURGERY  03/12/2010   BREAST SURGERY     BIOPSY   NECK SURGERY     PELVIC LAPAROSCOPY  [phone]   PILONIDAL CYST EXCISION     SALPINGOOPHORECTOMY  10/11/2002   SCOPE LSO-LEFT SEROUS CYSTADENOMA   SALPINGOOPHORECTOMY  04/12/2005   RSO/TOA    SHOULDER SURGERY Right 01/2018   TONSILLECTOMY AND ADENOIDECTOMY     TUBAL LIGATION     VAGINAL HYSTERECTOMY  01/10/2005   TVH, POSTERIOR REPAIR    ROS: Review of Systems Negative except as stated above  PHYSICAL EXAM: BP 132/84   Pulse 64   Temp 99 F (37.2 C) (Oral)   Resp 16   Ht '5\' 7"'  (1.702 m)   Wt 232 lb (105.2 kg)   SpO2 100%   BMI 36.34 kg/m   Physical Exam  General appearance -  alert, well appearing, older AAF and in no distress Mental status - normal mood, behavior, speech, dress, motor activity, and thought processes Neck - supple, no significant adenopathy Chest - clear to auscultation, no wheezes, rales or rhonchi, symmetric air entry Heart - normal rate, regular rhythm, normal S1, S2, no murmurs, rubs, clicks or gallops Extremities - trace edema in lower legs, ankles Skin - 1-1.5 cm soft moveable subcut mass palpable in the RT upper lumbar region.   Firm circular, raised hyperpigmented lesion about 3 mm in size in the medial antecubital fossa LT arm. 1 cm hyperpigmented, raised oblong lesion over the RT scapular. MSK: Hands: Scattered Heberden's nodes noted on the dorsal surface of the fingers.  Enlargement of the DIP and PIP joints with some deformity. Knees: Moderate joint enlargement.  Mild discomfort with passive range of motion.  Results for orders placed or performed in visit on 10/20/21  CBC  Result Value Ref Range   WBC 5.9 3.4 - 10.8 x10E3/uL   RBC 5.20 3.77 - 5.28 x10E6/uL   Hemoglobin 13.7 11.1 - 15.9 g/dL   Hematocrit 42.2 34.0 - 46.6 %   MCV 81 79 - 97 fL   MCH 26.3 (L) 26.6 - 33.0 pg   MCHC 32.5 31.5 - 35.7 g/dL   RDW 13.6 11.7 - 15.4 %   Platelets 212 150 - 450 x10E3/uL  Comprehensive metabolic panel  Result Value Ref Range   Glucose 86 70 - 99 mg/dL   BUN 24 8 - 27 mg/dL   Creatinine, Ser 1.10 (H) 0.57 - 1.00 mg/dL   eGFR 56 (L) >59 mL/min/1.73   BUN/Creatinine Ratio 22 12 - 28   Sodium 142 134 - 144 mmol/L   Potassium 3.7 3.5 -  5.2 mmol/L   Chloride 99 96 - 106 mmol/L   CO2 26 20 - 29 mmol/L   Calcium 9.4 8.7 - 10.3 mg/dL   Total Protein 6.8 6.0 - 8.5 g/dL   Albumin 4.3 3.9 - 4.9 g/dL   Globulin, Total 2.5 1.5 - 4.5 g/dL   Albumin/Globulin Ratio 1.7 1.2 - 2.2   Bilirubin Total 0.2 0.0 - 1.2 mg/dL   Alkaline Phosphatase 111 44 - 121 IU/L   AST 21 0 - 40 IU/L   ALT 19 0 - 32 IU/L  Lipid panel  Result Value Ref Range   Cholesterol, Total 219 (H) 100 - 199 mg/dL   Triglycerides 178 (H) 0 - 149 mg/dL   HDL 43 >39 mg/dL   VLDL Cholesterol Cal 32 5 - 40 mg/dL   LDL Chol Calc (NIH) 144 (H) 0 - 99 mg/dL   Chol/HDL Ratio 5.1 (H) 0.0 - 4.4 ratio  POCT URINALYSIS DIP (CLINITEK)  Result Value Ref Range   Color, UA yellow yellow   Clarity, UA clear clear   Glucose, UA negative negative mg/dL   Bilirubin, UA negative negative   Ketones, POC UA negative negative mg/dL   Spec Grav, UA 1.020 1.010 - 1.025   Blood, UA trace-lysed (A) negative   pH, UA 6.0 5.0 - 8.0   POC PROTEIN,UA negative negative, trace   Urobilinogen, UA 0.2 0.2 or 1.0 E.U./dL   Nitrite, UA Negative Negative   Leukocytes, UA Negative Negative  Glucose (CBG)  Result Value Ref Range   POC Glucose 119 (A) 70 - 99 mg/dl  HgB A1c  Result Value Ref Range   Hemoglobin A1C     HbA1c POC (<> result, manual entry)     HbA1c, POC (  prediabetic range)     HbA1c, POC (controlled diabetic range) 6.5 0.0 - 7.0 %       Latest Ref Rng & Units 10/20/2021    3:25 PM 03/18/2009    5:55 AM 03/17/2009   11:03 AM  CMP  Glucose 70 - 99 mg/dL 86  97  94   BUN 8 - 27 mg/dL '24  15  11   ' Creatinine 0.57 - 1.00 mg/dL 1.10  0.89  1.0   Sodium 134 - 144 mmol/L 142  142  141   Potassium 3.5 - 5.2 mmol/L 3.7  3.9  3.9   Chloride 96 - 106 mmol/L 99  106  103   CO2 20 - 29 mmol/L 26  29    Calcium 8.7 - 10.3 mg/dL 9.4  8.7    Total Protein 6.0 - 8.5 g/dL 6.8     Total Bilirubin 0.0 - 1.2 mg/dL 0.2     Alkaline Phos 44 - 121 IU/L 111     AST 0 - 40 IU/L 21     ALT  0 - 32 IU/L 19      CBC    Component Value Date/Time   WBC 5.9 10/20/2021 1525   WBC 6.1 03/18/2009 0555   RBC 5.20 10/20/2021 1525   RBC 4.43 03/18/2009 0555   HGB 13.7 10/20/2021 1525   HCT 42.2 10/20/2021 1525   PLT 212 10/20/2021 1525   MCV 81 10/20/2021 1525   MCH 26.3 (L) 10/20/2021 1525   MCHC 32.5 10/20/2021 1525   MCHC 33.2 03/18/2009 0555   RDW 13.6 10/20/2021 1525   LYMPHSABS 1.8 03/17/2009 1053   MONOABS 0.4 03/17/2009 1053   EOSABS 0.1 03/17/2009 1053   BASOSABS 0.0 03/17/2009 1053    ASSESSMENT AND PLAN:  1. Establishing care with new doctor, encounter for   2. Essential hypertension Close to goal. Home blood pressure readings are at goal.  Continue current dose of Bystolic and HCTZ and DASH diet. - nebivolol (BYSTOLIC) 5 MG tablet; Take 1 tablet (5 mg total) by mouth every morning.  Dispense: 90 tablet; Refill: 3 - hydrochlorothiazide (HYDRODIURIL) 25 MG tablet; Take 1 tablet (25 mg total) by mouth 2 (two) times daily.  Dispense: 180 tablet; Refill: 2  3. Type 2 diabetes mellitus with hypoglycemia without coma, without long-term current use of insulin (HCC) Discussed and encourage healthy eating habits. Encouraged her to move as much as her joints will allow. We will refer to endocrinology for further work-up given the frequent hypoglycemic episodes without being on diabetic medications. - Ambulatory referral to Endocrinology - CBC - Comprehensive metabolic panel - Lipid panel - Glucose (CBG) - HgB A1c  4. Primary osteoarthritis involving multiple joints Discussed changing from meloxicam to Celebrex.  Patient tells me she has been on that in the past and works about the same as the meloxicam.  She still has some tramadol at home which she can use as needed in combination with the meloxicam. - Ambulatory referral to Rheumatology - meloxicam (MOBIC) 15 MG tablet; Take 1 tablet (15 mg total) by mouth daily.  Dispense: 30 tablet; Refill: 5  5. Sjogren's  syndrome without extraglandular involvement ([place]) - Ambulatory referral to Rheumatology  6. Mild intermittent asthma without complication - ipratropium-albuterol (DUONEB) 0.5-2.5 (3) MG/3ML SOLN; Take 3 mLs by nebulization every 6 (six) hours as needed.  Dispense: 100 mL; Refill: 3 - montelukast (SINGULAIR) 10 MG tablet; Take 1 tablet (10 mg total) by mouth at bedtime.  Dispense:  30 tablet; Refill: 6  7. History of herpes genitalis I looked up the dosing for suppressive therapy of Valtrex for genital herpes.  For someone who is not immunocompromised, the dose is 500 mg once a day. - valACYclovir (VALTREX) 500 MG tablet; Take 1 tablet (500 mg total) by mouth daily.  Dispense: 30 tablet; Refill: 3  8. Cyst of skin - Ambulatory referral to Dermatology  9. Atypical mole - Ambulatory referral to Dermatology  10. Dysuria UA negative for UTI. - POCT URINALYSIS DIP (CLINITEK)  11. History of gout Sent refill on allopurinol. - allopurinol (ZYLOPRIM) 100 MG tablet; Take 1 tablet (100 mg total) by mouth daily.  Dispense: 30 tablet; Refill: 6  12. Gastroesophageal reflux disease without esophagitis Controlled on current dose of Dexilant. - dexlansoprazole (DEXILANT) 60 MG capsule; Take 1 capsule (60 mg total) by mouth every morning.  Dispense: 30 capsule; Refill: 5  13.  Class 2 severe obesity with serious comorbidity See #3 above. Patient reports she has seen the nutritionist in the past.    Patient was given the opportunity to ask questions.  Patient verbalized understanding of the plan and was able to repeat key elements of the plan.   This documentation was completed using Radio producer.  Any transcriptional errors are unintentional.  Orders Placed This Encounter  Procedures   CBC   Comprehensive metabolic panel   Lipid panel   Ambulatory referral to Rheumatology   Ambulatory referral to Endocrinology   Ambulatory referral to Dermatology   POCT URINALYSIS  DIP (CLINITEK)   Glucose (CBG)   HgB A1c     Requested Prescriptions   Signed Prescriptions Disp Refills   EPINEPHrine 0.3 mg/0.3 mL IJ SOAJ injection 1 each 1    Sig: Inject 0.3 mg into the muscle as needed for anaphylaxis.   ipratropium-albuterol (DUONEB) 0.5-2.5 (3) MG/3ML SOLN 100 mL 3    Sig: Take 3 mLs by nebulization every 6 (six) hours as needed.   meloxicam (MOBIC) 15 MG tablet 30 tablet 5    Sig: Take 1 tablet (15 mg total) by mouth daily.   cyclobenzaprine (FLEXERIL) 10 MG tablet 30 tablet 3    Sig: Take 1 tablet (10 mg total) by mouth 3 (three) times daily as needed.   nebivolol (BYSTOLIC) 5 MG tablet 90 tablet 3    Sig: Take 1 tablet (5 mg total) by mouth every morning.   hydrochlorothiazide (HYDRODIURIL) 25 MG tablet 180 tablet 2    Sig: Take 1 tablet (25 mg total) by mouth 2 (two) times daily.   montelukast (SINGULAIR) 10 MG tablet 30 tablet 6    Sig: Take 1 tablet (10 mg total) by mouth at bedtime.   ergocalciferol (VITAMIN D2) 1.25 MG (50000 UT) capsule 12 capsule 1    Sig: Take 1 capsule (50,000 Units total) by mouth once a week.   valACYclovir (VALTREX) 500 MG tablet 30 tablet 3    Sig: Take 1 tablet (500 mg total) by mouth daily.   allopurinol (ZYLOPRIM) 100 MG tablet 30 tablet 6    Sig: Take 1 tablet (100 mg total) by mouth daily.   dexlansoprazole (DEXILANT) 60 MG capsule 30 capsule 5    Sig: Take 1 capsule (60 mg total) by mouth every morning.   Continuous Blood Gluc Sensor (FREESTYLE LIBRE SENSOR SYSTEM) MISC 2 each 12    Sig: Change sensor Q 2 wks   albuterol (VENTOLIN HFA) 108 (90 Base) MCG/ACT inhaler 8 g 5  Sig: Inhale 2 puffs into the lungs every 6 (six) hours as needed for wheezing or shortness of breath.    Return in about 2 months (around 12/21/2021) for Please sign release for me to get records from your previous PCP.  Karle Plumber, MD, FACP

## 2021-10-21 ENCOUNTER — Encounter: Payer: Self-pay | Admitting: Internal Medicine

## 2021-10-21 ENCOUNTER — Telehealth: Payer: Self-pay | Admitting: Internal Medicine

## 2021-10-21 ENCOUNTER — Other Ambulatory Visit: Payer: Self-pay

## 2021-10-21 DIAGNOSIS — Z8619 Personal history of other infectious and parasitic diseases: Secondary | ICD-10-CM | POA: Insufficient documentation

## 2021-10-21 DIAGNOSIS — Z8739 Personal history of other diseases of the musculoskeletal system and connective tissue: Secondary | ICD-10-CM | POA: Insufficient documentation

## 2021-10-21 DIAGNOSIS — M159 Polyosteoarthritis, unspecified: Secondary | ICD-10-CM | POA: Insufficient documentation

## 2021-10-21 DIAGNOSIS — E11649 Type 2 diabetes mellitus with hypoglycemia without coma: Secondary | ICD-10-CM | POA: Insufficient documentation

## 2021-10-21 DIAGNOSIS — I10 Essential (primary) hypertension: Secondary | ICD-10-CM | POA: Insufficient documentation

## 2021-10-21 DIAGNOSIS — K219 Gastro-esophageal reflux disease without esophagitis: Secondary | ICD-10-CM | POA: Insufficient documentation

## 2021-10-21 DIAGNOSIS — N8110 Cystocele, unspecified: Secondary | ICD-10-CM

## 2021-10-21 DIAGNOSIS — J4520 Mild intermittent asthma, uncomplicated: Secondary | ICD-10-CM | POA: Insufficient documentation

## 2021-10-21 LAB — COMPREHENSIVE METABOLIC PANEL
ALT: 19 IU/L (ref 0–32)
AST: 21 IU/L (ref 0–40)
Albumin/Globulin Ratio: 1.7 (ref 1.2–2.2)
Albumin: 4.3 g/dL (ref 3.9–4.9)
Alkaline Phosphatase: 111 IU/L (ref 44–121)
BUN/Creatinine Ratio: 22 (ref 12–28)
BUN: 24 mg/dL (ref 8–27)
Bilirubin Total: 0.2 mg/dL (ref 0.0–1.2)
CO2: 26 mmol/L (ref 20–29)
Calcium: 9.4 mg/dL (ref 8.7–10.3)
Chloride: 99 mmol/L (ref 96–106)
Creatinine, Ser: 1.1 mg/dL — ABNORMAL HIGH (ref 0.57–1.00)
Globulin, Total: 2.5 g/dL (ref 1.5–4.5)
Glucose: 86 mg/dL (ref 70–99)
Potassium: 3.7 mmol/L (ref 3.5–5.2)
Sodium: 142 mmol/L (ref 134–144)
Total Protein: 6.8 g/dL (ref 6.0–8.5)
eGFR: 56 mL/min/{1.73_m2} — ABNORMAL LOW (ref >59)

## 2021-10-21 LAB — CBC
Hematocrit: 42.2 % (ref 34.0–46.6)
Hemoglobin: 13.7 g/dL (ref 11.1–15.9)
MCH: 26.3 pg — ABNORMAL LOW (ref 26.6–33.0)
MCHC: 32.5 g/dL (ref 31.5–35.7)
MCV: 81 fL (ref 79–97)
Platelets: 212 10*3/uL (ref 150–450)
RBC: 5.2 x10E6/uL (ref 3.77–5.28)
RDW: 13.6 % (ref 11.7–15.4)
WBC: 5.9 10*3/uL (ref 3.4–10.8)

## 2021-10-21 LAB — LIPID PANEL
Chol/HDL Ratio: 5.1 ratio — ABNORMAL HIGH (ref 0.0–4.4)
Cholesterol, Total: 219 mg/dL — ABNORMAL HIGH (ref 100–199)
HDL: 43 mg/dL (ref >39)
LDL Chol Calc (NIH): 144 mg/dL — ABNORMAL HIGH (ref 0–99)
Triglycerides: 178 mg/dL — ABNORMAL HIGH (ref 0–149)
VLDL Cholesterol Cal: 32 mg/dL (ref 5–40)

## 2021-10-21 MED ORDER — ATORVASTATIN CALCIUM 10 MG PO TABS: 10.0000 mg | ORAL_TABLET | Freq: Every day | ORAL | 5 refills | Status: DC

## 2021-10-21 NOTE — Telephone Encounter (Signed)
Phone call placed to patient this morning to go over lab results. Patient informed that her kidney function is not at 100%.  I do not have reason to previous labs to compare it to.  Request that she signs a release for me to get her records from her previous primary doctor.  If kidney function has been stable at this level with GFR of 56, we will monitor.  However if it is significantly decreased, we may consider stopping the meloxicam.  Patient expressed understanding. Blood cell counts are normal, liver function test normal.  Lipid panel abnormal with LDL at 144 with goal being less than 70.   Patient reports that levels were elevated in the past but had come down with changes in eating habits.  However she has been seeing a nutritionist and feels she continues to do well with her eating habits.  I discussed starting her on low-dose of atorvastatin to help get her cholesterol at goal.  Patient is agreeable to this.  Advised that cholesterol medications can sometimes affect the liver so we monitor liver function while patients are on the medication.  Baseline liver function tests normal.  We will recheck it again when I see her in 2 months.  She is agreeable to starting the atorvastatin.  Patient tells me that she feels something is going on with her kidneys since 05/2021.  She urinates quite frequently and feels that her bladder has dropped.  She looks in the mirror and can see that something has dropped.  When she had her hysterectomy, bladder had to be tacked up.  She feels it has fallen again.  Had seen the urologist in the past and reports being told that she needed to have a procedure to have something burned off the bladder but she declined.  I recommend referral to a urogynecologist for further evaluation.  Patient is agreeable to this.    Results for orders placed or performed in visit on 10/20/21  CBC  Result Value Ref Range   WBC 5.9 3.4 - 10.8 x10E3/uL   RBC 5.20 3.77 - 5.28 x10E6/uL    Hemoglobin 13.7 11.1 - 15.9 g/dL   Hematocrit 42.2 34.0 - 46.6 %   MCV 81 79 - 97 fL   MCH 26.3 (L) 26.6 - 33.0 pg   MCHC 32.5 31.5 - 35.7 g/dL   RDW 13.6 11.7 - 15.4 %   Platelets 212 150 - 450 x10E3/uL  Comprehensive metabolic panel  Result Value Ref Range   Glucose 86 70 - 99 mg/dL   BUN 24 8 - 27 mg/dL   Creatinine, Ser 1.10 (H) 0.57 - 1.00 mg/dL   eGFR 56 (L) >59 mL/min/1.73   BUN/Creatinine Ratio 22 12 - 28   Sodium 142 134 - 144 mmol/L   Potassium 3.7 3.5 - 5.2 mmol/L   Chloride 99 96 - 106 mmol/L   CO2 26 20 - 29 mmol/L   Calcium 9.4 8.7 - 10.3 mg/dL   Total Protein 6.8 6.0 - 8.5 g/dL   Albumin 4.3 3.9 - 4.9 g/dL   Globulin, Total 2.5 1.5 - 4.5 g/dL   Albumin/Globulin Ratio 1.7 1.2 - 2.2   Bilirubin Total 0.2 0.0 - 1.2 mg/dL   Alkaline Phosphatase 111 44 - 121 IU/L   AST 21 0 - 40 IU/L   ALT 19 0 - 32 IU/L  Lipid panel  Result Value Ref Range   Cholesterol, Total 219 (H) 100 - 199 mg/dL   Triglycerides 178 (H) 0 -  149 mg/dL   HDL 43 >39 mg/dL   VLDL Cholesterol Cal 32 5 - 40 mg/dL   LDL Chol Calc (NIH) 144 (H) 0 - 99 mg/dL   Chol/HDL Ratio 5.1 (H) 0.0 - 4.4 ratio  POCT URINALYSIS DIP (CLINITEK)  Result Value Ref Range   Color, UA yellow yellow   Clarity, UA clear clear   Glucose, UA negative negative mg/dL   Bilirubin, UA negative negative   Ketones, POC UA negative negative mg/dL   Spec Grav, UA 1.020 1.010 - 1.025   Blood, UA trace-lysed (A) negative   pH, UA 6.0 5.0 - 8.0   POC PROTEIN,UA negative negative, trace   Urobilinogen, UA 0.2 0.2 or 1.0 E.U./dL   Nitrite, UA Negative Negative   Leukocytes, UA Negative Negative  Glucose (CBG)  Result Value Ref Range   POC Glucose 119 (A) 70 - 99 mg/dl  HgB A1c  Result Value Ref Range   Hemoglobin A1C     HbA1c POC (<> result, manual entry)     HbA1c, POC (prediabetic range)     HbA1c, POC (controlled diabetic range) 6.5 0.0 - 7.0 %   The 10-year ASCVD risk score (Arnett DK, et al., 2019) is: 24.9%    Values used to calculate the score:     Age: 66 years     Sex: Female     Is Non-Hispanic African American: Yes     Diabetic: Yes     Tobacco smoker: No     Systolic Blood Pressure: 740 mmHg     Is BP treated: Yes     HDL Cholesterol: 43 mg/dL     Total Cholesterol: 219 mg/dL

## 2021-11-09 ENCOUNTER — Encounter: Payer: Self-pay | Admitting: Obstetrics and Gynecology

## 2021-11-09 ENCOUNTER — Ambulatory Visit: Payer: Medicare Other | Admitting: Obstetrics and Gynecology

## 2021-11-09 VITALS — BP 124/79 | HR 70 | Ht 65.0 in | Wt 226.0 lb

## 2021-11-09 DIAGNOSIS — K5904 Chronic idiopathic constipation: Secondary | ICD-10-CM

## 2021-11-09 DIAGNOSIS — R350 Frequency of micturition: Secondary | ICD-10-CM | POA: Diagnosis not present

## 2021-11-09 DIAGNOSIS — N816 Rectocele: Secondary | ICD-10-CM

## 2021-11-09 DIAGNOSIS — Z789 Other specified health status: Secondary | ICD-10-CM

## 2021-11-09 DIAGNOSIS — N8110 Cystocele, unspecified: Secondary | ICD-10-CM

## 2021-11-09 DIAGNOSIS — N3281 Overactive bladder: Secondary | ICD-10-CM | POA: Diagnosis not present

## 2021-11-09 DIAGNOSIS — M62838 Other muscle spasm: Secondary | ICD-10-CM

## 2021-11-09 NOTE — Progress Notes (Unsigned)
[facility] New Patient Evaluation and Consultation  Referring Provider: Ladell Pier, MD PCP: Ladell Pier, MD Date of Service: 11/09/2021  SUBJECTIVE Chief Complaint: New Patient (Initial Visit) FLOREAN HOOBLER is a 66 y.o. female here for a consult for prolapse.)  History of Present Illness: CARLYNE KEEHAN is a 66 y.o. Black or African-American female seen in consultation at the request of Dr. Wynetta Emery for evaluation of prolapse.    Review of records from Dr Wynetta Emery significant for: Has a history of "bladder tack" at the time of hysterectomy. Feels bladder has fallen again.   Urinary Symptoms: Leaks urine with cough/ sneeze, laughing, with a full bladder, and with urgency Leaks 1-2 time(s) per day.  UUI > SUI Pad use: 1 liners/ mini-pads per day.   She is bothered by her UI symptoms.  Day time voids 5-6.  Nocturia: 1-2 times per night to void. Voiding dysfunction: she empties her bladder well.  does not use a catheter to empty bladder.  When urinating, she feels she has no difficulties Drinks: only water  UTIs: 1 UTI's in the last year.   Reports history of pyelonephritis  Pelvic Organ Prolapse Symptoms:                  She Admits to a feeling of a bulge the vaginal area. It has been present for the last few years but has been worsening over the last 9 months.  She Denies seeing a bulge.  This bulge is bothersome. Per records, has history of TVH and posterior repair in 2006.  Used a pessary prior to her hysterectomy and it did not work well for her.   Bowel Symptom: Bowel movements: 2 time(s) per day Stool consistency: hard or soft  Straining: yes.  Splinting: yes.  Incomplete evacuation: yes.  She Denies accidental bowel leakage / fecal incontinence Bowel regimen: diet, vegetables, metamucil, miralax- just as needed, but is considering every day since it has not been working that well  Last colonoscopy: Date 2023- negative  Sexual  Function Sexually active: no.    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   OA (osteoarthritis)    Reflux    Sjogren's syndrome ([place])      Past Surgical History:   Past Surgical History:  Procedure Laterality Date   APPENDECTOMY     BACK SURGERY  03/12/2010   L5   BREAST SURGERY     BIOPSY   HERNIA REPAIR     groin bilateral   NECK SURGERY     PELVIC LAPAROSCOPY  [id number]   PILONIDAL CYST EXCISION     SALPINGOOPHORECTOMY  10/11/2002   SCOPE LSO-LEFT SEROUS CYSTADENOMA   SALPINGOOPHORECTOMY  04/12/2005   RSO/TOA   SHOULDER SURGERY Right 01/2018   TONSILLECTOMY AND ADENOIDECTOMY     TUBAL LIGATION     VAGINAL HYSTERECTOMY  01/10/2005   TVH, POSTERIOR REPAIR     Past OB/GYN History: OB History  Gravida Para Term Preterm AB Living  '5 2     3 2  '$ SAB IAB Ectopic Multiple Live Births  3       2    # Outcome Date GA Lbr Len/2nd Weight Sex Delivery Anes PTL Lv  5 SAB  4 SAB           3 SAB           2 Para           1 Para             Vaginal deliveries: 2,  Forceps/ Vacuum deliveries: 0 S/p hysterectomy 2006   Medications: She has a current medication list which includes the following prescription(s): acetaminophen, albuterol, allopurinol, atorvastatin, biotin, cetirizine hcl, vitamin d, freestyle libre sensor system, cyclobenzaprine, dexlansoprazole, epinephrine, ergocalciferol, hydrochlorothiazide, ipratropium-albuterol, meloxicam, montelukast, multiple vitamins-iron, nebivolol, tramadol, triamcinolone, and valacyclovir.   Allergies: Patient is allergic to codeine, duratuss [phenylephrine-guaifenesin], erythromycin, hydrocodone-acetaminophen, iodine, latex, oxycodone, peanuts [nuts], penicillins, poultry meal,  shellfish allergy, and vicodin [hydrocodone-acetaminophen].   Social History:  Social History   Tobacco Use   Smoking status: Former   Smokeless tobacco: Never  Scientific laboratory technician Use: Never used  Substance Use Topics   Alcohol use: Yes    Comment: Rare   Drug use: No    Relationship status: single She lives alone She is not employed. Regular exercise: No History of abuse: No  Family History:   Family History  Problem Relation Age of Onset   Heart disease Mother    Arthritis Mother    Hyperlipidemia Mother    Heart disease Father    Hypertension Father    Cancer Father        Prostate and lung   Breast cancer Maternal Aunt        LATE 50'S   Ovarian cancer Maternal Aunt    Cancer Paternal Grandmother        COLON CA     Review of Systems: Review of Systems  Constitutional:  Positive for malaise/fatigue. Negative for fever and weight loss.  Respiratory:  Positive for shortness of breath and wheezing. Negative for cough.   Cardiovascular:  Positive for palpitations and leg swelling. Negative for chest pain.  Gastrointestinal:  Positive for abdominal pain. Negative for blood in stool.  Genitourinary:  Negative for dysuria.  Musculoskeletal:  Negative for myalgias.  Skin:  Negative for rash.  Neurological:  Positive for dizziness and headaches.  Endo/Heme/Allergies:  Bruises/bleeds easily.       + hot flashes  Psychiatric/Behavioral:  Negative for depression. The patient is not nervous/anxious.      OBJECTIVE Physical Exam: Vitals:   11/09/21 1536  BP: 124/79  Pulse: 70  Weight: 226 lb (102.5 kg)  Height: '5\' 5"'$  (1.651 m)    Physical Exam Constitutional:      General: She is not in acute distress. Pulmonary:     Effort: Pulmonary effort is normal.  Abdominal:     General: There is no distension.     Palpations: Abdomen is soft.     Tenderness: There is no abdominal tenderness. There is no rebound.  Musculoskeletal:        General: No swelling.  Normal range of motion.  Skin:    General: Skin is warm and dry.     Findings: No rash.  Neurological:     Mental Status: She is alert and oriented to person, place, and time.  Psychiatric:        Mood and Affect: Mood normal.        Behavior: Behavior normal.      GU / Detailed Urogynecologic Evaluation:  Pelvic Exam: Normal external female genitalia; Bartholin's and Skene's glands normal in appearance; urethral meatus normal in appearance, no urethral  masses or discharge.   CST: negative  s/p hysterectomy: Speculum exam reveals normal vaginal mucosa with  atrophy and normal vaginal cuff.  Adnexa no mass, fullness, tenderness.    Pelvic floor strength II/V, puborectalis IV/V external anal sphincter IV/V  Pelvic floor musculature: Right levator tender, Right obturator tender, Left levator tender, Left obturator tender  POP-Q:   POP-Q  -2                                            Aa   -2                                           Ba  -6                                              C   3                                            Gh  4                                            Pb  6.5                                            tvl   -2                                            Ap  -2                                            Bp                                                 D   Patient also examined in the standing position. Mirror given to patient and she pointed to a rugation under the urethra which she feels bulging out with straining.   Rectal Exam:  Normal sphincter tone, small distal rectocele, enterocoele not present, no rectal masses, no sign of dyssynergia when asking the patient to bear down.  Post-Void Residual (PVR) by Bladder Scan: In order to evaluate bladder emptying, we discussed obtaining a postvoid residual and she agreed to this procedure.  Procedure: The ultrasound unit was placed on the patient's abdomen in the suprapubic region after  the patient had voided. A PVR of 1 ml was obtained by bladder scan.  Laboratory Results:  POC urine: negative   ASSESSMENT AND PLAN Ms. Ebersole is a 66 y.o. with:  1. Prolapse of anterior vaginal wall   2. Prolapse of posterior vaginal wall   3. Levator spasm   4. Urinary frequency   5. Overactive bladder   6. Weight loss advised   7. Chronic idiopathic constipation    Stage I anterior, Stage I posterior, Stage I apical prolapse - mild prolapse noted today. Patient pointed to vaginal rugation under the urethra as what pushes out when she strains.  - We discussed that sometimes prolapse can come and go depending on strain on the pelvis. We can plan to reexamine in the future.   2. Levator spasm - Pelvic floor muscle tenderness may be contributing to bladder urgency and feeling of pelvic pressure.  - referral placed to pelvic floor physical therapy.   3. OAB - We discussed the symptoms of overactive bladder (OAB), which include urinary urgency, urinary frequency, nocturia, with or without urge incontinence.  While we do not know the exact etiology of OAB, several treatment options exist. We discussed management including behavioral therapy (decreasing bladder irritants, urge suppression strategies, timed voids, bladder retraining), physical therapy, medication; for refractory cases posterior tibial nerve stimulation, sacral neuromodulation, and intravesical botulinum toxin injection.  - Limited with medications due to sjogrens - She would like to try pelvic physical therapy first  4. Weight loss - patient expressed difficulty with weight loss. We reviewed losing weight can also help with incontinence - referral placed to healthy weight and wellness clinic  5. Constipation - increase miralax to daily and titrate as needed  Return 3 months or sooner if needed   Jaquita Folds, MD

## 2021-11-09 NOTE — Patient Instructions (Signed)

## 2021-11-10 LAB — POCT URINALYSIS DIPSTICK
Bilirubin, UA: NEGATIVE
Blood, UA: NEGATIVE
Glucose, UA: NEGATIVE
Ketones, UA: NEGATIVE
Leukocytes, UA: NEGATIVE
Nitrite, UA: NEGATIVE
Protein, UA: NEGATIVE
Spec Grav, UA: 1.02 (ref 1.010–1.025)
Urobilinogen, UA: 0.2 E.U./dL
pH, UA: 7.5 (ref 5.0–8.0)

## 2021-12-11 NOTE — Therapy (Unsigned)
OUTPATIENT PHYSICAL THERAPY FEMALE PELVIC EVALUATION   Patient Name: [name] MRN: [id number] DOB:Dec 23, 1955, 66 y.o., female Today's Date: 12/15/2021   PT End of Session - 12/15/21 1353     Visit Number 1    Date for PT Re-Evaluation 03/09/22    Authorization Type [employer] Medicare    Authorization - Visit Number 1    Authorization - Number of Visits 10    PT Start Time 1400    PT Stop Time 1453    PT Time Calculation (min) 53 min    Activity Tolerance Patient tolerated treatment well    Behavior During Therapy WFL for tasks assessed/performed             Past Medical History:  Diagnosis Date   Asthma    Diabetes mellitus without complication ([place])    Gout    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   OA (osteoarthritis)    Reflux    Sjogren's syndrome ([place])    Past Surgical History:  Procedure Laterality Date   APPENDECTOMY     BACK SURGERY  03/12/2010   L5   BREAST SURGERY     BIOPSY   HERNIA REPAIR     groin bilateral   NECK SURGERY     PELVIC LAPAROSCOPY  [id number]   PILONIDAL CYST EXCISION     SALPINGOOPHORECTOMY  10/11/2002   SCOPE LSO-LEFT SEROUS CYSTADENOMA   SALPINGOOPHORECTOMY  04/12/2005   RSO/TOA   SHOULDER SURGERY Right 01/2018   TONSILLECTOMY AND ADENOIDECTOMY     TUBAL LIGATION     VAGINAL HYSTERECTOMY  01/10/2005   TVH, POSTERIOR REPAIR   Patient Active Problem List   Diagnosis Date Noted   Essential hypertension 10/21/2021   Type 2 diabetes mellitus with hypoglycemia without coma, without long-term current use of insulin ([name]) 10/21/2021   Primary osteoarthritis involving multiple joints 10/21/2021   Mild intermittent asthma without complication 93/26/7124   History of herpes genitalis 10/21/2021   History of gout 10/21/2021   Gastroesophageal reflux disease without esophagitis 10/21/2021   Class 2 severe obesity with serious comorbidity and body mass index (BMI) of 36.0 to 36.9 in adult [facility]) 10/21/2021   Reflux    Asthma    Sjogren's  syndrome ([place])     PCP: Ladell Pier, MD  REFERRING PROVIDER: Jaquita Folds, MD  REFERRING DIAG:  [phone] (ICD-10-CM) - Levator spasm  N32.81 (ICD-10-CM) - Overactive bladder    THERAPY DIAG:  Muscle weakness (generalized)  Other lack of coordination  Rationale for Evaluation and Treatment Rehabilitation  ONSET DATE: 12/15/2020  SUBJECTIVE:  SUBJECTIVE STATEMENT: When she has to go to the bathroom you has to get there. I had pain with the internal examination.  My kidneys are not functioning as well. I take a fluid pill.  Fluid intake: Yes: water     PRECAUTIONS: None  WEIGHT BEARING RESTRICTIONS No  FALLS:  Has patient fallen in last 6 months? No  LIVING ENVIRONMENT: Lives with: lives alone  OCCUPATION: retired  PLOF: Independent  PATIENT GOALS relax my pelvic floor.   PERTINENT HISTORY:  Vaginal hysterectomy with TVH, Posterior repair 01/10/2005.  Diabetes; Sjogren's; Appendectomy; Hernia repair; tailbone removed  Sexual abuse: No  BOWEL MOVEMENT Pain with bowel movement: No Type of bowel movement:Type (Bristol Stool Scale) Type 1, 3, 4, Frequency has one every time she eats, Strain Yes, and Splinting yes ; lean back to have a bowel movement Fully empty rectum: No Leakage: Yes: leak stool with gas Pads: No Fiber supplement: Yes: Metamucil  URINATION Pain with urination: No Fully empty bladder: Yes:   Stream: Strong Urgency: Yes:   Frequency: Day time voids 5-6.  Nocturia: 1-2 times per night to void. Leakage: Urge to void, Walking to the bathroom, Coughing, Sneezing, and Laughing; leaks 1-2 times per day Pads: Yes: 1 pantyliner per day  when she is out for more than 2 hours   PREGNANCY Vaginal deliveries 2 Tearing No  PROLAPSE Stage I anterior, Stage  I posterior, Stage I apical prolapse    OBJECTIVE:   DIAGNOSTIC FINDINGS:  MD findings: Pelvic floor strength II/V, puborectalis IV/V external anal sphincter IV/V PVR of 1 ml was obtained by bladder scan   Pelvic floor musculature: Right levator tender, Right obturator tender, Left levator tender, Left obturator tender    COGNITION:  Overall cognitive status: Within functional limits for tasks assessed     SENSATION:  Light touch: Appears intact  Proprioception: Appears intact                POSTURE:  scoliosis   PELVIC ALIGNMENT:  LUMBARAROM/PROM Lumbar ROM is limited by 25%   LOWER EXTREMITY ROM:  Passive ROM Right eval Left eval  Hip flexion 100 100  Hip external rotation 40 40   (Blank rows = not tested)  LOWER EXTREMITY MMT:  MMT Right eval Left eval  Hip abduction 3+/5 3+/5    PALPATION:   General  able to contract her lower abdomen correctly. Scar on the lower abdomen is tight and restricted. Restrictions around the coccyx where it was removed.                 External Perineal Exam intact, negative Q-tip test                             Internal Pelvic Floor tender on bilateral levator ani and obturator internist  Patient confirms identification and approves PT to assess internal pelvic floor and treatment Yes  PELVIC MMT:   MMT eval  Vaginal Ant. Post. 2/5 and lateral is 1/5, no circular hug of the therapist finger  (       TONE: increased   TODAY'S TREATMENT  EVAL Date: 12/15/2021 HEP established-see below  Educated patient on vaginal Vitamin E and anal glycerine suppositories to work with her dryness    PATIENT EDUCATION:  Education details: Access Code: [email or web address], education on vitamin E for vaginal canal and glycerine suppositories for anal canal Person educated: Patient Education method: Explanation, Demonstration, Tactile cues, Verbal cues,  and Handouts Education comprehension: verbalized understanding, returned demonstration,  verbal cues required, tactile cues required, and needs further education   HOME EXERCISE PROGRAM: 12/15/2021 Access Code: [email or web address] URL: [email or web address] Date: 12/15/2021 Prepared by: Earlie Counts  Program Notes glycerin suppositories into the anusvitamin E into the vaginal canal 2 times per week  Exercises - Seated Diaphragmatic Breathing  - 3 x daily - 7 x weekly - 1 sets - 10 reps  ASSESSMENT:  CLINICAL IMPRESSION: Patient is a 66 y.o. female who was seen today for physical therapy evaluation and treatment for ovreactive bladder and levator spasm. Patient has had urinary issue for 1 year. Her history includes Sjogren and removal of tailbone. Patient reported she had increased pain when Dr. Wannetta Sender assessed the pelvic floor internally.She will strain and splint to have a bowel movement. She is taking Miralax to assist with her constipation. Patient bowel movements are Type 1, 3, and 4. Patient reports when she passes gas some stool may come out. She does not feel like she empties her rectum fully.  Patient leaks urine with urge to void, walking to the bathroom, coughing, sneezing, and laughing. She will  leak 1-2 times per day. Patient  has a very strong urge to void that she is not able to dely it. The urine will gush out when she leaks with the urge to void. Pelvic floor strength is 2/5 anteriorly and posteriorly and 1/5 on the sides with no circular hug of the therapist finger. She has tendereness located on bilateral levator ani and obturator internist. Scar on the lower abdomen is tight and restricted. Restrictions around the coccyx where it was removed.     OBJECTIVE IMPAIRMENTS decreased activity tolerance, decreased coordination, decreased endurance, decreased strength, increased fascial restrictions, increased muscle spasms, and impaired tone.   ACTIVITY LIMITATIONS lifting, bending, standing, transfers, continence, and toileting  PARTICIPATION LIMITATIONS:  community activity  PERSONAL FACTORS 3+ comorbidities:    Vaginal hysterectomy with TVH, Posterior repair 01/10/2005.  Diabetes; Sjogren's; Appendectomy; Hernia repair; tailbone removedare also affecting patient's functional outcome.   REHAB POTENTIAL: Excellent  CLINICAL DECISION MAKING: Stable/uncomplicated  EVALUATION COMPLEXITY: Low   GOALS: Goals reviewed with patient? Yes  SHORT TERM GOALS: Target date: 01/12/2022  Patient understands ways to moisturize the vaginal and anal canal to improve tissue health.  Baseline: Goal status: INITIAL  2.  Patient reports her urge to void improved >/= 25% using the behavioral technique.  Baseline:  Goal status: INITIAL  3.  Patient educated on correct toileting technique to empty her bowels better.  Baseline:  Goal status: INITIAL  LONG TERM GOALS: Target date: 03/09/2022   Patient independent with advanced HEP for pelvic floor strength and coordination exercises.  Baseline:  Goal status: INITIAL  2.  Patient reports her urinary leakage with urge to void decreased >/= 75% using the correct behavioral technique.  Baseline:  Goal status: INITIAL  3.  Patient is able to go out of the house for 2 hours or more without  a pad due to improve strength of pelvic floor >/= 3/5 and good circular hug of therapist finger.  Baseline:  Goal status: INITIAL  4.  Patient is able to have a vaginal exam with pain no more than 2/10 due to reduction of trigger points in the pelvic floor muscles and improved tissue health.  Baseline:  Goal status: INITIAL  5.  Improved mobility of the lower abdominal scar to put less pressure on the bladder to reduce her leakage.  Baseline:  Goal status: INITIAL  PLAN: PT FREQUENCY: 1x/week  PT DURATION: 12 weeks  PLANNED INTERVENTIONS: Therapeutic exercises, Therapeutic activity, Neuromuscular re-education, Patient/Family education, Self Care, Joint mobilization, Dry Needling, Cryotherapy, Moist heat,  Biofeedback, and Manual therapy  PLAN FOR NEXT SESSION: manual work to lower abdomen scar and educate patient, hip stretches, bulging of the pelvic floor, manual work to the pelvic floor,    Earlie Counts, PT 12/15/21 3:43 PM

## 2021-12-15 ENCOUNTER — Other Ambulatory Visit: Payer: Self-pay

## 2021-12-15 ENCOUNTER — Encounter: Payer: Self-pay | Admitting: Physical Therapy

## 2021-12-15 ENCOUNTER — Encounter: Payer: Medicare Other | Attending: Obstetrics and Gynecology | Admitting: Physical Therapy

## 2021-12-15 DIAGNOSIS — M62838 Other muscle spasm: Secondary | ICD-10-CM | POA: Diagnosis not present

## 2021-12-15 DIAGNOSIS — R278 Other lack of coordination: Secondary | ICD-10-CM

## 2021-12-15 DIAGNOSIS — N3281 Overactive bladder: Secondary | ICD-10-CM | POA: Diagnosis not present

## 2021-12-15 DIAGNOSIS — M6281 Muscle weakness (generalized): Secondary | ICD-10-CM

## 2021-12-24 ENCOUNTER — Encounter: Payer: Self-pay | Admitting: Physical Therapy

## 2021-12-24 ENCOUNTER — Encounter: Payer: Self-pay | Admitting: Internal Medicine

## 2021-12-24 ENCOUNTER — Ambulatory Visit: Payer: Medicare Other | Attending: Internal Medicine | Admitting: Internal Medicine

## 2021-12-24 ENCOUNTER — Encounter: Payer: Medicare Other | Admitting: Physical Therapy

## 2021-12-24 VITALS — BP 109/76 | HR 61 | Ht 67.0 in | Wt 227.4 lb

## 2021-12-24 DIAGNOSIS — Z6379 Other stressful life events affecting family and household: Secondary | ICD-10-CM

## 2021-12-24 DIAGNOSIS — E1169 Type 2 diabetes mellitus with other specified complication: Secondary | ICD-10-CM | POA: Diagnosis not present

## 2021-12-24 DIAGNOSIS — Z1331 Encounter for screening for depression: Secondary | ICD-10-CM | POA: Diagnosis not present

## 2021-12-24 DIAGNOSIS — E785 Hyperlipidemia, unspecified: Secondary | ICD-10-CM

## 2021-12-24 DIAGNOSIS — Z23 Encounter for immunization: Secondary | ICD-10-CM | POA: Diagnosis not present

## 2021-12-24 DIAGNOSIS — E11649 Type 2 diabetes mellitus with hypoglycemia without coma: Secondary | ICD-10-CM

## 2021-12-24 DIAGNOSIS — R278 Other lack of coordination: Secondary | ICD-10-CM

## 2021-12-24 DIAGNOSIS — Z87898 Personal history of other specified conditions: Secondary | ICD-10-CM

## 2021-12-24 DIAGNOSIS — M62838 Other muscle spasm: Secondary | ICD-10-CM | POA: Diagnosis not present

## 2021-12-24 DIAGNOSIS — N3281 Overactive bladder: Secondary | ICD-10-CM | POA: Diagnosis not present

## 2021-12-24 DIAGNOSIS — M6281 Muscle weakness (generalized): Secondary | ICD-10-CM

## 2021-12-24 MED ORDER — MECLIZINE HCL 25 MG PO TABS: 25.0000 mg | ORAL_TABLET | Freq: Two times a day (BID) | ORAL | 1 refills | Status: DC | PRN

## 2021-12-24 NOTE — Addendum Note (Signed)
Addended by: Mariana Arn on: 12/24/2021 02:34 PM   Modules accepted: Orders

## 2021-12-24 NOTE — Therapy (Signed)
OUTPATIENT PHYSICAL THERAPY TREATMENT NOTE   Patient Name: [name] MRN: [id number] DOB:03/13/1956, 66 y.o., female Today's Date: 12/24/2021  PCP: Johnson, Deborah B, MD REFERRING PROVIDER: Schroeder, Michelle N, MD  END OF SESSION:   PT End of Session - 12/24/21 1505     Visit Number 2    Date for PT Re-Evaluation 03/09/22    Authorization Type [employer] Medicare    Authorization - Visit Number 2    Authorization - Number of Visits 10    PT Start Time 1500    PT Stop Time 1545    PT Time Calculation (min) 45 min    Activity Tolerance Patient tolerated treatment well    Behavior During Therapy WFL for tasks assessed/performed             Past Medical History:  Diagnosis Date   Asthma    Diabetes mellitus without complication (HCC)    Gout    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   OA (osteoarthritis)    Reflux    Sjogren's syndrome (HCC)    Past Surgical History:  Procedure Laterality Date   APPENDECTOMY     BACK SURGERY  03/12/2010   L5   BREAST SURGERY     BIOPSY   HERNIA REPAIR     groin bilateral   NECK SURGERY     PELVIC LAPAROSCOPY  1990,1993   PILONIDAL CYST EXCISION     SALPINGOOPHORECTOMY  10/11/2002   SCOPE LSO-LEFT SEROUS CYSTADENOMA   SALPINGOOPHORECTOMY  04/12/2005   RSO/TOA   SHOULDER SURGERY Right 01/2018   TONSILLECTOMY AND ADENOIDECTOMY     TUBAL LIGATION     VAGINAL HYSTERECTOMY  01/10/2005   TVH, POSTERIOR REPAIR   Patient Active Problem List   Diagnosis Date Noted   Hyperlipidemia associated with type 2 diabetes mellitus (HCC) 12/24/2021   Essential hypertension 10/21/2021   Type 2 diabetes mellitus with hypoglycemia without coma, without long-term current use of insulin (HCC) 10/21/2021   Primary osteoarthritis involving multiple joints 10/21/2021   Mild intermittent asthma without complication 10/21/2021   History of herpes genitalis 10/21/2021   History of gout 10/21/2021   Gastroesophageal reflux disease without esophagitis  10/21/2021   Class 2 severe obesity with serious comorbidity and body mass index (BMI) of 36.0 to 36.9 in adult (HCC) 10/21/2021   Reflux    Asthma    Sjogren's syndrome (HCC)    REFERRING DIAG:  M62.838 (ICD-10-CM) - Levator spasm  N32.81 (ICD-10-CM) - Overactive bladder      THERAPY DIAG:  Muscle weakness (generalized)   Other lack of coordination   Rationale for Evaluation and Treatment Rehabilitation   ONSET DATE: 12/15/2020   SUBJECTIVE:                                                                                                                                                                                              SUBJECTIVE STATEMENT: I have been trying to work on my breathing. I can feel the pelvic floor relax.  I can make it to the bathroom now. I still wear a poise pad when I go further than 2 hours. I have been using Metamucils and the stool is better and comes out smoothly without strain.    PRECAUTIONS: None   WEIGHT BEARING RESTRICTIONS No   FALLS:  Has patient fallen in last 6 months? No   LIVING ENVIRONMENT: Lives with: lives alone   OCCUPATION: retired   PLOF: Independent   PATIENT GOALS relax my pelvic floor.    PERTINENT HISTORY:  Vaginal hysterectomy with TVH, Posterior repair 01/10/2005.  Diabetes; Sjogren's; Appendectomy; Hernia repair; tailbone removed   Sexual abuse: No   BOWEL MOVEMENT Pain with bowel movement: No Type of bowel movement:Type (Bristol Stool Scale) Type 1, 3, 4, Frequency has one every time she eats, Strain Yes, and Splinting yes ; lean back to have a bowel movement Fully empty rectum: No Leakage: Yes: leak stool with gas Pads: No Fiber supplement: Yes: Metamucil   URINATION Pain with urination: No Fully empty bladder: Yes:   Stream: Strong Urgency: Yes:   Frequency: Day time voids 5-6.  Nocturia: 1-2 times per night to void. Leakage: Urge to void, Walking to the bathroom, Coughing, Sneezing, and Laughing; leaks  1-2 times per day Pads: Yes: 1 pantyliner per day  when she is out for more than 2 hours     PREGNANCY Vaginal deliveries 2 Tearing No   PROLAPSE Stage I anterior, Stage I posterior, Stage I apical prolapse       OBJECTIVE:    DIAGNOSTIC FINDINGS:  MD findings: Pelvic floor strength II/V, puborectalis IV/V external anal sphincter IV/V PVR of 1 ml was obtained by bladder scan   Pelvic floor musculature: Right levator tender, Right obturator tender, Left levator tender, Left obturator tender     COGNITION:            Overall cognitive status: Within functional limits for tasks assessed                          SENSATION:            Light touch: Appears intact            Proprioception: Appears intact                  POSTURE:  scoliosis               PELVIC ALIGNMENT:   LUMBARAROM/PROM Lumbar ROM is limited by 25%     LOWER EXTREMITY ROM:   Passive ROM Right eval Left eval  Hip flexion 100 100  Hip external rotation 40 40   (Blank rows = not tested)   LOWER EXTREMITY MMT:   MMT Right eval Left eval  Hip abduction 3+/5 3+/5     PALPATION:   General  able to contract her lower abdomen correctly. Scar on the lower abdomen is tight and restricted. Restrictions around the coccyx where it was removed.                  External Perineal Exam intact, negative Q-tip test                             Internal Pelvic Floor tender on bilateral levator ani and obturator internist   Patient confirms   identification and approves PT to assess internal pelvic floor and treatment Yes   PELVIC MMT:   MMT eval  Vaginal Ant. Post. 2/5 and lateral is 1/5, no circular hug of the therapist finger  (       TONE: increased     TODAY'S TREATMENT  12/24/2021 Manual: Soft tissue mobilization:soft tissue work to the lower abdomen to release the restrictions Scar tissue mobilization:to the lower abdominal scar going through the restrictions and educated patient on how to  perform at home.  Myofascial release:release through the umbilicus with movement of legs  Neuromuscular re-education: Down training:diaphragmatic breathing to elongate the pelvic floor Exercises: Stretches/mobility:single knee to chest hold 30 sec bil.  Trunk rotation 10x each side Piriformis stretch in supine holding 30 sec each Self-care: Educated patient on vaginal moisturizers and health to improve dryness.     EVAL Date: 12/15/2021 HEP established-see below  Educated patient on vaginal Vitamin E and anal glycerine suppositories to work with her dryness       PATIENT EDUCATION:  12/24/2021 Education details: Access Code: [email or web address], vaginal moisturizers and vaginal health, scar massage Person educated: Patient Education method: Explanation, Demonstration, Tactile cues, Verbal cues, and Handouts Education comprehension: verbalized understanding, returned demonstration, verbal cues required, tactile cues required, and needs further education     HOME EXERCISE PROGRAM: 12/24/2021 Access Code: [email or web address] URL: [email or web address] Date: 12/24/2021 Prepared by: Earlie Counts  Program Notes glycerin suppositories into the anusvitamin E into the vaginal canal 2 times per week  Exercises - Seated Diaphragmatic Breathing  - 3 x daily - 7 x weekly - 1 sets - 10 reps - Supine Single Knee to Chest Stretch  - 1 x daily - 7 x weekly - 1 sets - 2 reps - Supine Piriformis Stretch Pulling Heel to Hip  - 1 x daily - 7 x weekly - 1 sets - 2 reps - Supine Lower Trunk Rotation  - 1 x daily - 7 x weekly - 1 sets - 10 reps    ASSESSMENT:   CLINICAL IMPRESSION: Patient is a 66 y.o. female who was seen today for physical therapy treatment for ovreactive bladder and levator spasm.Patient understands how to perform scar massage at home. Patient reports she is not having issues with her bowel movements since she is using the miralax. Patient understands how to use vaginal moisturizers and  around the rectum to reduce her dryness.  She has tendereness located on bilateral levator ani and obturator internist. Scar on the lower abdomen is tight and restricted. Restrictions around the coccyx where it was removed.        OBJECTIVE IMPAIRMENTS decreased activity tolerance, decreased coordination, decreased endurance, decreased strength, increased fascial restrictions, increased muscle spasms, and impaired tone.    ACTIVITY LIMITATIONS lifting, bending, standing, transfers, continence, and toileting   PARTICIPATION LIMITATIONS: community activity   PERSONAL FACTORS 3+ comorbidities:    Vaginal hysterectomy with TVH, Posterior repair 01/10/2005.  Diabetes; Sjogren's; Appendectomy; Hernia repair; tailbone removedare also affecting patient's functional outcome.    REHAB POTENTIAL: Excellent   CLINICAL DECISION MAKING: Stable/uncomplicated   EVALUATION COMPLEXITY: Low     GOALS: Goals reviewed with patient? Yes   SHORT TERM GOALS: Target date: 01/12/2022   Patient understands ways to moisturize the vaginal and anal canal to improve tissue health.  Baseline: Goal status: Met 12/24/2021   2.  Patient reports her urge to void improved >/= 25% using the behavioral technique.  Baseline:  Goal status: INITIAL   3.  Patient  educated on correct toileting technique to empty her bowels better.  Baseline:  Goal status: INITIAL   LONG TERM GOALS: Target date: 03/09/2022    Patient independent with advanced HEP for pelvic floor strength and coordination exercises.  Baseline:  Goal status: INITIAL   2.  Patient reports her urinary leakage with urge to void decreased >/= 75% using the correct behavioral technique.  Baseline:  Goal status: INITIAL   3.  Patient is able to go out of the house for 2 hours or more without  a pad due to improve strength of pelvic floor >/= 3/5 and good circular hug of therapist finger.  Baseline:  Goal status: INITIAL   4.  Patient is able to have a  vaginal exam with pain no more than 2/10 due to reduction of trigger points in the pelvic floor muscles and improved tissue health.  Baseline:  Goal status: INITIAL   5.  Improved mobility of the lower abdominal scar to put less pressure on the bladder to reduce her leakage.  Baseline:  Goal status: INITIAL   PLAN: PT FREQUENCY: 1x/week   PT DURATION: 12 weeks   PLANNED INTERVENTIONS: Therapeutic exercises, Therapeutic activity, Neuromuscular re-education, Patient/Family education, Self Care, Joint mobilization, Dry Needling, Cryotherapy, Moist heat, Biofeedback, and Manual therapy   PLAN FOR NEXT SESSION: manual work to lower abdomen scar and educate patient, hip stretches, bulging of the pelvic floor, manual work to the pelvic floor,   Earlie Counts, PT 12/24/21 3:53 PM

## 2021-12-24 NOTE — Patient Instructions (Signed)
Try to eat a healthy snack in between meals and at bedtime to keep your blood sugars up.  Keep your upcoming appointment with the endocrinologist.  Always keep some glucose tablets on you to chew as needed for when blood sugars are low.  I have sent refill on the meclizine to your pharmacy.

## 2021-12-24 NOTE — Patient Instructions (Addendum)
Moisturizers They are used in the vagina to hydrate the mucous membrane that make up the vaginal canal. Designed to keep a more normal acid balance (ph) Once placed in the vagina, it will last between two to three days.  Use 2-3 times per week at bedtime  Ingredients to avoid is glycerin and fragrance, can increase chance of infection Should not be used just before sex due to causing irritation Most are gels administered either in a tampon-shaped applicator or as a vaginal suppository. They are non-hormonal.   Types of Moisturizers(internal use)  Vitamin E vaginal suppositories- Whole foods, Amazon Moist Again Coconut oil- can break down condoms Julva- (Do no use if on Tamoxifen) amazon Yes moisturizer- amazon NeuEve Silk , NeuEve Silver for menopausal or over 65 (if have severe vaginal atrophy or cancer treatments use NeuEve Silk for  1 month than move to [employer])- [employer], [place].com Olive and Bee intimate cream- [email or web address] Mae vaginal moisturizer- Amazon Aloe Good Clean Love- target Hyaluronic acid Hyalofemme   Creams to use externally on the Vulva area V-magic cream - amazon Julva-amazon Vital "V [place] salve ( help moisturize and help with thinning vulvar area, does have Ransom by Irwin Brakeman labial moisturizer (Amazon,  Coconut or olive oil aloe Good Clean Love Enchanted Rose by intimate rose  Things to avoid in the vaginal area Do not use things to irritate the vulvar area No lotions just specialized creams for the vulva area- Neogyn, V-magic, No soaps; can use Aveeno or Calendula cleanser if needed. Must be gentle No deodorants No douches Good to sleep without underwear to let the vaginal area to air out No scrubbing: spread the lips to let warm water rinse over labias and pat dry  Earlie Counts, PT [facility] [address] [place], [place]  [address] W: [phone] [email or web address]

## 2021-12-24 NOTE — Progress Notes (Signed)
Patient ID: [name], female    DOB: September 20, 1955  MRN: [id number]  CC: Hypertension and Diabetes   Subjective: [name] is a 66 y.o. female who presents for chronic ds management Her concerns today include:  Patient with history of DM type II, HTN, Sjogren syndrome, Raynaud's phenomenon, OA knees/ankles/shoulders/hips, HSV 2, asthma, gout, thyroid nodules followed by Dr. Harlow Asa, genital herpes, GERD   saw the uro-gynecologist the end of July.  Diagnosed with prolapse of anterior and posterior vaginal wall and overactive bladder.  Referred for pelvic floor therapy which she will be doing once a week.  DM with hypoglycemia:  will being seeing [facility] endo 01/11/2022 Still having symptomatic hypoglycemia episodes day and night. Low BS sometimes twice at nights. Lowest in past 2 wks has been 67.  Eating meals on time and sometimes she has a snack in/bw.  Has [employer].  Avg BS for past 7 days 131, past 14 days 125, last 1 mth 119.    HL: on Lipitor x 2 mths which we started after last visit.  Would like to be recheck  Had vertigo in past and her previous PCP had prescribed Meclizine.  Current bottle has expired.  Would like to keep a RF on hand  Pos Dep screen: She states that she does not feel depressed;  just has a lot going on and feels tired.  Also not sleeping well at night because of the getting up through the night with low blood sugars.  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  One of her  brothers will be moving here and she will be his health care power of attorney.  Recently another brother was hospitalized at [facility] with severe allergic reaction after being stung by yellow jacket bees.  She was going back and forth to [facility].  Has upcoming appointment with her rheumatologist.  Will need refill on tramadol which she gets through her rheumatologist.  HM: due for flu, PCV 20, Tdapt, Zoster does not wish to receive all 4 of them today.  Agreeable to receiving the flu and pneumonia vaccine  today.  Patient Active Problem List   Diagnosis Date Noted   Essential hypertension 10/21/2021   Type 2 diabetes mellitus with hypoglycemia without coma, without long-term current use of insulin ([place]) 10/21/2021   Primary osteoarthritis involving multiple joints 10/21/2021   Mild intermittent asthma without complication 27/09/2374   History of herpes genitalis 10/21/2021   History of gout 10/21/2021   Gastroesophageal reflux disease without esophagitis 10/21/2021   Class 2 severe obesity with serious comorbidity and body mass index (BMI) of 36.0 to 36.9 in adult [facility]) 10/21/2021   Reflux    Asthma    Sjogren's syndrome ([place])      Current Outpatient Medications on File Prior to Visit  Medication Sig Dispense Refill   acetaminophen (TYLENOL) 650 MG CR tablet Take 650 mg by mouth as needed.       albuterol (VENTOLIN HFA) 108 (90 Base) MCG/ACT inhaler Inhale 2 puffs into the lungs every 6 (six) hours as needed for wheezing or shortness of breath. 8 g 5   allopurinol (ZYLOPRIM) 100 MG tablet Take 1 tablet (100 mg total) by mouth daily. 30 tablet 6   atorvastatin (LIPITOR) 10 MG tablet Take 1 tablet (10 mg total) by mouth daily. 30 tablet 5   BIOTIN PO Take by mouth.     Cetirizine HCl (ZYRTEC PO) Take 10 mg by mouth.      Cholecalciferol (VITAMIN  D PO) Take by mouth. TAKES 2000      Continuous Blood Gluc Sensor (FREESTYLE LIBRE SENSOR SYSTEM) MISC Change sensor Q 2 wks 2 each 12   cyclobenzaprine (FLEXERIL) 10 MG tablet Take 1 tablet (10 mg total) by mouth 3 (three) times daily as needed. 30 tablet 3   cycloSPORINE (RESTASIS) 0.05 % ophthalmic emulsion 1 drop 2 (two) times daily.     dexlansoprazole (DEXILANT) 60 MG capsule Take 1 capsule (60 mg total) by mouth every morning. 30 capsule 5   EPINEPHrine 0.3 mg/0.3 mL IJ SOAJ injection Inject 0.3 mg into the muscle as needed for anaphylaxis. 1 each 1   ergocalciferol (VITAMIN D2) 1.25 MG (50000 UT) capsule Take 1 capsule (50,000 Units total)  by mouth once a week. 12 capsule 1   hydrochlorothiazide (HYDRODIURIL) 25 MG tablet Take 1 tablet (25 mg total) by mouth 2 (two) times daily. 180 tablet 2   ipratropium-albuterol (DUONEB) 0.5-2.5 (3) MG/3ML SOLN Take 3 mLs by nebulization every 6 (six) hours as needed. 100 mL 3   meloxicam (MOBIC) 15 MG tablet Take 1 tablet (15 mg total) by mouth daily. 30 tablet 5   montelukast (SINGULAIR) 10 MG tablet Take 1 tablet (10 mg total) by mouth at bedtime. 30 tablet 6   Multiple Vitamins-Iron (MULTIVITAMIN/IRON PO) Take by mouth.       nebivolol (BYSTOLIC) 5 MG tablet Take 1 tablet (5 mg total) by mouth every morning. 90 tablet 3   traMADol (ULTRAM) 50 MG tablet Take 50 mg by mouth every 6 (six) hours as needed.     triamcinolone (NASACORT) 55 MCG/ACT nasal inhaler Place 2 sprays into the nose as needed.       valACYclovir (VALTREX) 500 MG tablet Take 1 tablet (500 mg total) by mouth daily. 30 tablet 3   No current facility-administered medications on file prior to visit.    Allergies  Allergen Reactions   Codeine Swelling   Duratuss [Phenylephrine-Guaifenesin] Hives   Erythromycin Swelling    ALLERGIC TO MYCINS   Hydrocodone-Acetaminophen Swelling   Iodine Swelling   Latex Swelling   Oxycodone Swelling   Peanuts [Nuts] Swelling   Penicillins Swelling   Poultry Meal Swelling   Shellfish Allergy Swelling   Vicodin [Hydrocodone-Acetaminophen] Swelling    Social History   Socioeconomic History   Marital status: Single    Spouse name: Not on file   Number of children: Not on file   Years of education: Not on file   Highest education level: Not on file  Occupational History   Not on file  Tobacco Use   Smoking status: Former   Smokeless tobacco: Never  Vaping Use   Vaping Use: Never used  Substance and Sexual Activity   Alcohol use: Yes    Comment: Rare   Drug use: No   Sexual activity: Not Currently    Birth control/protection: Post-menopausal  Other Topics Concern   Not  on file  Social History Narrative   Not on file   Social Determinants of Health   Financial Resource Strain: Not on file  Food Insecurity: Not on file  Transportation Needs: Not on file  Physical Activity: Not on file  Stress: Not on file  Social Connections: Not on file  Intimate Partner Violence: Not on file    Family History  Problem Relation Age of Onset   Heart disease Mother    Arthritis Mother    Hyperlipidemia Mother    Heart disease Father  Hypertension Father    Cancer Father        Prostate and lung   Breast cancer Maternal Aunt        LATE 50'S   Ovarian cancer Maternal Aunt    Cancer Paternal Grandmother        COLON CA    Past Surgical History:  Procedure Laterality Date   APPENDECTOMY     BACK SURGERY  03/12/2010   L5   BREAST SURGERY     BIOPSY   HERNIA REPAIR     groin bilateral   NECK SURGERY     PELVIC LAPAROSCOPY  1610,9604   PILONIDAL CYST EXCISION     SALPINGOOPHORECTOMY  10/11/2002   SCOPE LSO-LEFT SEROUS CYSTADENOMA   SALPINGOOPHORECTOMY  04/12/2005   RSO/TOA   SHOULDER SURGERY Right 01/2018   TONSILLECTOMY AND ADENOIDECTOMY     TUBAL LIGATION     VAGINAL HYSTERECTOMY  01/10/2005   TVH, POSTERIOR REPAIR    ROS: Review of Systems Negative except as stated above  PHYSICAL EXAM: BP 109/76   Pulse 61   Ht '5\' 7"'$  (1.702 m)   Wt 227 lb 6.4 oz (103.1 kg)   SpO2 99%   BMI 35.62 kg/m   Physical Exam  General appearance - alert, well appearing, older African-American female and in no distress Mental status - normal mood, behavior, speech, dress, motor activity, and thought processes Neck - supple, no significant adenopathy Chest - clear to auscultation, no wheezes, rales or rhonchi, symmetric air entry Heart - normal rate, regular rhythm, normal S1, S2, no murmurs, rubs, clicks or gallops Extremities - peripheral pulses normal, no pedal edema, no clubbing or cyanosis     12/24/2021   11:32 AM  Depression screen PHQ 2/9   Decreased Interest 1  Down, Depressed, Hopeless 1  PHQ - 2 Score 2  Altered sleeping 1  Tired, decreased energy 1  Change in appetite 0  Feeling bad or failure about yourself  0  Trouble concentrating 0  Moving slowly or fidgety/restless 0  Suicidal thoughts 0  PHQ-9 Score 4       Latest Ref Rng & Units 10/20/2021    3:25 PM 03/18/2009    5:55 AM 03/17/2009   11:03 AM  CMP  Glucose 70 - 99 mg/dL 86  97  94   BUN 8 - 27 mg/dL '24  15  11   '$ Creatinine 0.57 - 1.00 mg/dL 1.10  0.89  1.0   Sodium 134 - 144 mmol/L 142  142  141   Potassium 3.5 - 5.2 mmol/L 3.7  3.9  3.9   Chloride 96 - 106 mmol/L 99  106  103   CO2 20 - 29 mmol/L 26  29    Calcium 8.7 - 10.3 mg/dL 9.4  8.7    Total Protein 6.0 - 8.5 g/dL 6.8     Total Bilirubin 0.0 - 1.2 mg/dL 0.2     Alkaline Phos 44 - 121 IU/L 111     AST 0 - 40 IU/L 21     ALT 0 - 32 IU/L 19      Lipid Panel     Component Value Date/Time   CHOL 219 (H) 10/20/2021 1525   TRIG 178 (H) 10/20/2021 1525   HDL 43 10/20/2021 1525   CHOLHDL 5.1 (H) 10/20/2021 1525   LDLCALC 144 (H) 10/20/2021 1525    CBC    Component Value Date/Time   WBC 5.9 10/20/2021 1525   WBC 6.1  03/18/2009 0555   RBC 5.20 10/20/2021 1525   RBC 4.43 03/18/2009 0555   HGB 13.7 10/20/2021 1525   HCT 42.2 10/20/2021 1525   PLT 212 10/20/2021 1525   MCV 81 10/20/2021 1525   MCH 26.3 (L) 10/20/2021 1525   MCHC 32.5 10/20/2021 1525   MCHC 33.2 03/18/2009 0555   RDW 13.6 10/20/2021 1525   LYMPHSABS 1.8 03/17/2009 1053   MONOABS 0.4 03/17/2009 1053   EOSABS 0.1 03/17/2009 1053   BASOSABS 0.0 03/17/2009 1053    ASSESSMENT AND PLAN:  1. Diabetic hypoglycemia ([place]) Her average blood sugar readings for 1 month, 2 weeks and 1 week are good.  However she continues to have hypoglycemic episodes despite not being on diabetic medications.  Encouraged her to eat a snack between meals and a snack at bedtime.  Keep upcoming appointment with endocrinology next month.  2.  Hyperlipidemia associated with type 2 diabetes mellitus (HCC) Continue atorvastatin.  We will recheck lipid profile and liver function test today. - Lipid panel - Hepatic Function Panel  3. History of vertigo - meclizine (ANTIVERT) 25 MG tablet; Take 1 tablet (25 mg total) by mouth 2 (two) times daily as needed for dizziness.  Dispense: 30 tablet; Refill: 1  4. Positive depression screening Patient does not feel that she is depressed.  She has had increased life stresses.  We discussed ways to help manage stress including meditation, taking 1 hour out of each day to do something for herself, listening to music etc.  5. Stressful life event affecting family See #5 above.  6. Need for vaccination against Streptococcus pneumoniae PCV 20 given today.  7. Need for immunization against influenza - Flu Vaccine QUAD 90moIM (Fluarix, Fluzone & Alfiuria Quad PF)   Patient was given the opportunity to ask questions.  Patient verbalized understanding of the plan and was able to repeat key elements of the plan.   This documentation was completed using [employer] producer  Any transcriptional errors are unintentional.  No orders of the defined types were placed in this encounter.    Requested Prescriptions    No prescriptions requested or ordered in this encounter    No follow-ups on file.  DKarle Plumber MD, FACP

## 2021-12-29 DIAGNOSIS — R682 Dry mouth, unspecified: Secondary | ICD-10-CM | POA: Diagnosis not present

## 2021-12-29 DIAGNOSIS — M79672 Pain in left foot: Secondary | ICD-10-CM | POA: Diagnosis not present

## 2021-12-29 DIAGNOSIS — M79641 Pain in right hand: Secondary | ICD-10-CM | POA: Diagnosis not present

## 2021-12-29 DIAGNOSIS — M79642 Pain in left hand: Secondary | ICD-10-CM | POA: Diagnosis not present

## 2021-12-29 DIAGNOSIS — H04129 Dry eye syndrome of unspecified lacrimal gland: Secondary | ICD-10-CM | POA: Diagnosis not present

## 2021-12-29 DIAGNOSIS — M2560 Stiffness of unspecified joint, not elsewhere classified: Secondary | ICD-10-CM | POA: Diagnosis not present

## 2021-12-29 DIAGNOSIS — M79671 Pain in right foot: Secondary | ICD-10-CM | POA: Diagnosis not present

## 2021-12-29 DIAGNOSIS — R768 Other specified abnormal immunological findings in serum: Secondary | ICD-10-CM | POA: Diagnosis not present

## 2021-12-29 DIAGNOSIS — M109 Gout, unspecified: Secondary | ICD-10-CM | POA: Diagnosis not present

## 2021-12-29 DIAGNOSIS — M3500 Sicca syndrome, unspecified: Secondary | ICD-10-CM | POA: Diagnosis not present

## 2021-12-31 ENCOUNTER — Encounter: Payer: Self-pay | Admitting: Physical Therapy

## 2021-12-31 ENCOUNTER — Encounter: Payer: Medicare Other | Admitting: Physical Therapy

## 2021-12-31 DIAGNOSIS — R278 Other lack of coordination: Secondary | ICD-10-CM

## 2021-12-31 DIAGNOSIS — M62838 Other muscle spasm: Secondary | ICD-10-CM | POA: Diagnosis not present

## 2021-12-31 DIAGNOSIS — N3281 Overactive bladder: Secondary | ICD-10-CM | POA: Diagnosis not present

## 2021-12-31 DIAGNOSIS — M6281 Muscle weakness (generalized): Secondary | ICD-10-CM

## 2021-12-31 NOTE — Therapy (Signed)
OUTPATIENT PHYSICAL THERAPY TREATMENT NOTE   Patient Name: [name] MRN: [id number] DOB:1956-01-01, 66 y.o., female Today's Date: 12/31/2021  PCP: Ladell Pier, MD REFERRING PROVIDER: Jaquita Folds, MD  END OF SESSION:   PT End of Session - 12/31/21 1033     Visit Number 3    Date for PT Re-Evaluation 03/09/22    Authorization Type [employer] Medicare    Authorization - Visit Number 3    Authorization - Number of Visits 10    PT Start Time 1030    PT Stop Time 1115    PT Time Calculation (min) 45 min    Activity Tolerance Patient tolerated treatment well    Behavior During Therapy WFL for tasks assessed/performed             Past Medical History:  Diagnosis Date   Asthma    Diabetes mellitus without complication ([place])    Gout    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   OA (osteoarthritis)    Reflux    Sjogren's syndrome ([place])    Past Surgical History:  Procedure Laterality Date   APPENDECTOMY     BACK SURGERY  03/12/2010   L5   BREAST SURGERY     BIOPSY   HERNIA REPAIR     groin bilateral   NECK SURGERY     PELVIC LAPAROSCOPY  1222,4114   PILONIDAL CYST EXCISION     SALPINGOOPHORECTOMY  10/11/2002   SCOPE LSO-LEFT SEROUS CYSTADENOMA   SALPINGOOPHORECTOMY  04/12/2005   RSO/TOA   SHOULDER SURGERY Right 01/2018   TONSILLECTOMY AND ADENOIDECTOMY     TUBAL LIGATION     VAGINAL HYSTERECTOMY  01/10/2005   TVH, POSTERIOR REPAIR   Patient Active Problem List   Diagnosis Date Noted   Hyperlipidemia associated with type 2 diabetes mellitus ([place]) 12/24/2021   Essential hypertension 10/21/2021   Type 2 diabetes mellitus with hypoglycemia without coma, without long-term current use of insulin ([place]) 10/21/2021   Primary osteoarthritis involving multiple joints 10/21/2021   Mild intermittent asthma without complication 64/31/4276   History of herpes genitalis 10/21/2021   History of gout 10/21/2021   Gastroesophageal reflux disease without esophagitis  10/21/2021   Class 2 severe obesity with serious comorbidity and body mass index (BMI) of 36.0 to 36.9 in adult ([place]) 10/21/2021   Reflux    Asthma    Sjogren's syndrome ([facility])    REFERRING DIAG:  R01.100 (ICD-10-CM) - Levator spasm  N32.81 (ICD-10-CM) - Overactive bladder      THERAPY DIAG:  Muscle weakness (generalized)   Other lack of coordination   Rationale for Evaluation and Treatment Rehabilitation   ONSET DATE: 12/15/2020   SUBJECTIVE:  SUBJECTIVE STATEMENT: My back is getting looser with the stretches.   PRECAUTIONS: None   WEIGHT BEARING RESTRICTIONS No   FALLS:  Has patient fallen in last 6 months? No   LIVING ENVIRONMENT: Lives with: lives alone   OCCUPATION: retired   PLOF: Independent   PATIENT GOALS relax my pelvic floor.    PERTINENT HISTORY:  Vaginal hysterectomy with TVH, Posterior repair 01/10/2005.  Diabetes; Sjogren's; Appendectomy; Hernia repair; tailbone removed   Sexual abuse: No   BOWEL MOVEMENT Pain with bowel movement: No Type of bowel movement:Type (Bristol Stool Scale) Type 1, 3, 4, Frequency has one every time she eats, Strain Yes, and Splinting yes ; lean back to have a bowel movement Fully empty rectum: No Leakage: Yes: leak stool with gas Pads: No Fiber supplement: Yes: Metamucil   URINATION Pain with urination: No Fully empty bladder: Yes:   Stream: Strong Urgency: Yes:   Frequency: Day time voids 5-6.  Nocturia: 1-2 times per night to void. Leakage: Urge to void, Walking to the bathroom, Coughing, Sneezing, and Laughing; leaks 1-2 times per day Pads: Yes: 1 pantyliner per day  when she is out for more than 2 hours     PREGNANCY Vaginal deliveries 2 Tearing No   PROLAPSE Stage I anterior, Stage I posterior, Stage I apical prolapse        OBJECTIVE:    DIAGNOSTIC FINDINGS:  MD findings: Pelvic floor strength II/V, puborectalis IV/V external anal sphincter IV/V PVR of 1 ml was obtained by bladder scan   Pelvic floor musculature: Right levator tender, Right obturator tender, Left levator tender, Left obturator tender     COGNITION:            Overall cognitive status: Within functional limits for tasks assessed                          SENSATION:            Light touch: Appears intact            Proprioception: Appears intact                  POSTURE:  scoliosis               PELVIC ALIGNMENT:   LUMBARAROM/PROM Lumbar ROM is limited by 25%     LOWER EXTREMITY ROM:   Passive ROM Right eval Left eval  Hip flexion 100 100  Hip external rotation 40 40   (Blank rows = not tested)   LOWER EXTREMITY MMT:   MMT Right eval Left eval  Hip abduction 3+/5 3+/5     PALPATION:   General  able to contract her lower abdomen correctly. Scar on the lower abdomen is tight and restricted. Restrictions around the coccyx where it was removed.                  External Perineal Exam intact, negative Q-tip test                             Internal Pelvic Floor tender on bilateral levator ani and obturator internist   Patient confirms identification and approves PT to assess internal pelvic floor and treatment Yes   PELVIC MMT:   MMT eval  Vaginal Ant. Post. 2/5 and lateral is 1/5, no circular hug of the therapist finger         TONE: increased  TODAY'S TREATMENT  12/31/2021 Manual: Soft tissue mobilization:manual work to the lumbar paraspinals, along the quadratus, gluteals, TFL, piriformis and coccygeus in prone; manual work to the abdomen to release the tissue Scar tissue mobilization:using the suction cup and manual work to the  scar to release the tissue.  Myofascial release:suction cup to the lower abdomen to release the tissue, fascial release to the umbilicus, release suprapubically to give the  bladder room to fill, lifting around the mesenteric root, release in the right lower quadrant Neuromuscular re-education: Pelvic floor contraction training:contraction of the pelvic floor in sitting without using the gluteals, hands on stomach to engage the lower abdomen. She was able to feel the pelvic floor contract.     12/24/2021 Manual: Soft tissue mobilization:soft tissue work to the lower abdomen to release the restrictions Scar tissue mobilization:to the lower abdominal scar going through the restrictions and educated patient on how to perform at home.  Myofascial release:release through the umbilicus with movement of legs  Neuromuscular re-education: Down training:diaphragmatic breathing to elongate the pelvic floor Exercises: Stretches/mobility:single knee to chest hold 30 sec bil.  Trunk rotation 10x each side Piriformis stretch in supine holding 30 sec each Self-care: Educated patient on vaginal moisturizers and health to improve dryness.     EVAL Date: 12/15/2021 HEP established-see below  Educated patient on vaginal Vitamin E and anal glycerine suppositories to work with her dryness       PATIENT EDUCATION:  12/24/2021 Education details: Access Code: [email or web address], vaginal moisturizers and vaginal health, scar massage Person educated: Patient Education method: Explanation, Demonstration, Tactile cues, Verbal cues, and Handouts Education comprehension: verbalized understanding, returned demonstration, verbal cues required, tactile cues required, and needs further education     HOME EXERCISE PROGRAM: 12/24/2021 Access Code: [email or web address] URL: [email or web address] Date: 12/24/2021 Prepared by: Earlie Counts   Program Notes glycerin suppositories into the anusvitamin E into the vaginal canal 2 times per week   Exercises - Seated Diaphragmatic Breathing  - 3 x daily - 7 x weekly - 1 sets - 10 reps - Supine Single Knee to Chest Stretch  - 1 x daily - 7 x weekly - 1  sets - 2 reps - Supine Piriformis Stretch Pulling Heel to Hip  - 1 x daily - 7 x weekly - 1 sets - 2 reps - Supine Lower Trunk Rotation  - 1 x daily - 7 x weekly - 1 sets - 10 reps     ASSESSMENT:   CLINICAL IMPRESSION: Patient is a 66 y.o. female who was seen today for physical therapy treatment for ovreactive bladder and levator spasm.Patient scar has improved mobility. She had good bowel sounds during treatment. Patient had increased trigger points in the lumbar, gluteals and piriformis. She is now able to feel the pelvic floor contraction without contracting her gluteals. She is able to engage her lower abdomen with pelvic floor contraction since restrictions are released. She reports less urinary leakage since therapy started and no constipation issues at this time. Patient will benefit from skilled  therapy to reduce muscle tightness and improve coordination.       OBJECTIVE IMPAIRMENTS decreased activity tolerance, decreased coordination, decreased endurance, decreased strength, increased fascial restrictions, increased muscle spasms, and impaired tone.    ACTIVITY LIMITATIONS lifting, bending, standing, transfers, continence, and toileting   PARTICIPATION LIMITATIONS: community activity   PERSONAL FACTORS 3+ comorbidities:    Vaginal hysterectomy with TVH, Posterior repair 01/10/2005.  Diabetes; Sjogren's; Appendectomy; Hernia repair; tailbone removedare also affecting patient's functional outcome.  REHAB POTENTIAL: Excellent   CLINICAL DECISION MAKING: Stable/uncomplicated   EVALUATION COMPLEXITY: Low     GOALS: Goals reviewed with patient? Yes   SHORT TERM GOALS: Target date: 01/12/2022   Patient understands ways to moisturize the vaginal and anal canal to improve tissue health.  Baseline: Goal status: Met 12/24/2021   2.  Patient reports her urge to void improved >/= 25% using the behavioral technique.  Baseline:  Goal status: INITIAL   3.  Patient educated on correct  toileting technique to empty her bowels better.  Baseline:  Goal status: INITIAL   LONG TERM GOALS: Target date: 03/09/2022    Patient independent with advanced HEP for pelvic floor strength and coordination exercises.  Baseline:  Goal status: INITIAL   2.  Patient reports her urinary leakage with urge to void decreased >/= 75% using the correct behavioral technique.  Baseline:  Goal status: INITIAL   3.  Patient is able to go out of the house for 2 hours or more without  a pad due to improve strength of pelvic floor >/= 3/5 and good circular hug of therapist finger.  Baseline:  Goal status: INITIAL   4.  Patient is able to have a vaginal exam with pain no more than 2/10 due to reduction of trigger points in the pelvic floor muscles and improved tissue health.  Baseline:  Goal status: INITIAL   5.  Improved mobility of the lower abdominal scar to put less pressure on the bladder to reduce her leakage.  Baseline:  Goal status: INITIAL   PLAN: PT FREQUENCY: 1x/week   PT DURATION: 12 weeks   PLANNED INTERVENTIONS: Therapeutic exercises, Therapeutic activity, Neuromuscular re-education, Patient/Family education, Self Care, Joint mobilization, Dry Needling, Cryotherapy, Moist heat, Biofeedback, and Manual therapy   PLAN FOR NEXT SESSION: dry needling to lumbar, gluteus medius, piriformis;  bulging of the pelvic floor, manual work to the pelvic floor, see how it is going with stool leakage with gas, go over toileting  Earlie Counts, PT 12/31/21 10:35 AM

## 2022-01-07 ENCOUNTER — Encounter: Payer: Medicare Other | Admitting: Physical Therapy

## 2022-01-07 ENCOUNTER — Encounter: Payer: Self-pay | Admitting: Physical Therapy

## 2022-01-07 DIAGNOSIS — N3281 Overactive bladder: Secondary | ICD-10-CM | POA: Diagnosis not present

## 2022-01-07 DIAGNOSIS — R278 Other lack of coordination: Secondary | ICD-10-CM

## 2022-01-07 DIAGNOSIS — M6281 Muscle weakness (generalized): Secondary | ICD-10-CM

## 2022-01-07 DIAGNOSIS — M62838 Other muscle spasm: Secondary | ICD-10-CM | POA: Diagnosis not present

## 2022-01-07 NOTE — Therapy (Signed)
OUTPATIENT PHYSICAL THERAPY TREATMENT NOTE   Patient Name: [name] MRN: [id number] DOB:12/27/1955, 66 y.o., female Today's Date: 01/07/2022  PCP: Ladell Pier, MD REFERRING PROVIDER: Jaquita Folds, MD  END OF SESSION:   PT End of Session - 01/07/22 0938     Visit Number 4    Date for PT Re-Evaluation 03/09/22    Authorization Type [employer] Medicare    Authorization - Visit Number 4    Authorization - Number of Visits 10    PT Start Time 0930    PT Stop Time 0712    PT Time Calculation (min) 45 min    Activity Tolerance Patient tolerated treatment well    Behavior During Therapy [facility] for tasks assessed/performed             Past Medical History:  Diagnosis Date   Asthma    Diabetes mellitus without complication ([place])    Gout    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   OA (osteoarthritis)    Reflux    Sjogren's syndrome ([place])    Past Surgical History:  Procedure Laterality Date   APPENDECTOMY     BACK SURGERY  03/12/2010   L5   BREAST SURGERY     BIOPSY   HERNIA REPAIR     groin bilateral   NECK SURGERY     PELVIC LAPAROSCOPY  [id number]   PILONIDAL CYST EXCISION     SALPINGOOPHORECTOMY  10/11/2002   SCOPE LSO-LEFT SEROUS CYSTADENOMA   SALPINGOOPHORECTOMY  04/12/2005   RSO/TOA   SHOULDER SURGERY Right 01/2018   TONSILLECTOMY AND ADENOIDECTOMY     TUBAL LIGATION     VAGINAL HYSTERECTOMY  01/10/2005   TVH, POSTERIOR REPAIR   Patient Active Problem List   Diagnosis Date Noted   Hyperlipidemia associated with type 2 diabetes mellitus ([place]) 12/24/2021   Essential hypertension 10/21/2021   Type 2 diabetes mellitus with hypoglycemia without coma, without long-term current use of insulin ([place]) 10/21/2021   Primary osteoarthritis involving multiple joints 10/21/2021   Mild intermittent asthma without complication 54/98/2641   History of herpes genitalis 10/21/2021   History of gout 10/21/2021   Gastroesophageal reflux disease without esophagitis  10/21/2021   Class 2 severe obesity with serious comorbidity and body mass index (BMI) of 36.0 to 36.9 in adult ([place]) 10/21/2021   Reflux    Asthma    Sjogren's syndrome ([facility])    REFERRING DIAG:  R83.094 (ICD-10-CM) - Levator spasm  N32.81 (ICD-10-CM) - Overactive bladder      THERAPY DIAG:  Muscle weakness (generalized)   Other lack of coordination   Rationale for Evaluation and Treatment Rehabilitation   ONSET DATE: 12/15/2020   SUBJECTIVE:  SUBJECTIVE STATEMENT: Urgency is getting better.    PRECAUTIONS: None   WEIGHT BEARING RESTRICTIONS No   FALLS:  Has patient fallen in last 6 months? No   LIVING ENVIRONMENT: Lives with: lives alone   OCCUPATION: retired   PLOF: Independent   PATIENT GOALS relax my pelvic floor.    PERTINENT HISTORY:  Vaginal hysterectomy with TVH, Posterior repair 01/10/2005.  Diabetes; Sjogren's; Appendectomy; Hernia repair; tailbone removed   Sexual abuse: No   BOWEL MOVEMENT Pain with bowel movement: No Type of bowel movement:Type (Bristol Stool Scale) Type 1, 3, 4, Frequency has one every time she eats, Strain Yes, and Splinting yes ; lean back to have a bowel movement Fully empty rectum: No Leakage: Yes: leak stool with gas Pads: No Fiber supplement: Yes: Metamucil   URINATION Pain with urination: No Fully empty bladder: Yes:   Stream: Strong Urgency: Yes:   Frequency: Day time voids 5-6.  Nocturia: 1-2 times per night to void. Leakage: Urge to void, Walking to the bathroom, Coughing, Sneezing, and Laughing; leaks 1-2 times per day Pads: Yes: 1 pantyliner per day  when she is out for more than 2 hours     PREGNANCY Vaginal deliveries 2 Tearing No   PROLAPSE Stage I anterior, Stage I posterior, Stage I apical prolapse       OBJECTIVE:     DIAGNOSTIC FINDINGS:  MD findings: Pelvic floor strength II/V, puborectalis IV/V external anal sphincter IV/V PVR of 1 ml was obtained by bladder scan   Pelvic floor musculature: Right levator tender, Right obturator tender, Left levator tender, Left obturator tender     COGNITION:            Overall cognitive status: Within functional limits for tasks assessed                          SENSATION:            Light touch: Appears intact            Proprioception: Appears intact                  POSTURE:  scoliosis               PELVIC ALIGNMENT:   LUMBARAROM/PROM Lumbar ROM is limited by 25%     LOWER EXTREMITY ROM:   Passive ROM Right eval Left eval  Hip flexion 100 100  Hip external rotation 40 40   (Blank rows = not tested)   LOWER EXTREMITY MMT:   MMT Right eval Left eval  Hip abduction 3+/5 3+/5     PALPATION:   General  able to contract her lower abdomen correctly. Scar on the lower abdomen is tight and restricted. Restrictions around the coccyx where it was removed.                  External Perineal Exam intact, negative Q-tip test                             Internal Pelvic Floor tender on bilateral levator ani and obturator internist   Patient confirms identification and approves PT to assess internal pelvic floor and treatment Yes   PELVIC MMT:   MMT eval  Vaginal Ant. Post. 2/5 and lateral is 1/5, no circular hug of the therapist finger          TONE: increased  TODAY'S TREATMENT  01/07/2022 Manual: Soft tissue mobilization:manual work to elongate the muscles including  piriformis, gluteus, lumbar multifid, coccygeus after dry needling ; Manual work to the right quadratus to reduce trigger points.  Myofascial release:using the iastim tool to work on the gluteals and piriformis and coccygeus Trigger Point Dry-Needling  Treatment instructions: Expect mild to moderate muscle soreness. S/S of pneumothorax if dry needled over a lung field, and  to seek immediate medical attention should they occur. Patient verbalized understanding of these instructions and education.  Patient Consent Given: Yes Education handout provided: Yes Muscles treated: glutesu medius, piriformis, gluteus maximus, lumbar multifidi, coccygeus Electrical stimulation performed: No Parameters: N/A Treatment response/outcome: trigger point response and elongation of muscles Manual work to assess for dry needling.  Exercises: Stretches/mobility:supine stretch for piriformis hold 15 sec bil.  Ovid holding for 30 seconds bil.  Supine hamstring stretch hold 30 sec bil.  Therapeutic activities: Functional strengthening activities:educated patient on how to toilet correctly with proper breath to push the stool out. She is too tight in hips to use a squatty potty.  12/31/2021 Manual: Soft tissue mobilization:manual work to the lumbar paraspinals, along the quadratus, gluteals, TFL, piriformis and coccygeus in prone; manual work to the abdomen to release the tissue Scar tissue mobilization:using the suction cup and manual work to the  scar to release the tissue.  Myofascial release:suction cup to the lower abdomen to release the tissue, fascial release to the umbilicus, release suprapubically to give the bladder room to fill, lifting around the mesenteric root, release in the right lower quadrant Neuromuscular re-education: Pelvic floor contraction training:contraction of the pelvic floor in sitting without using the gluteals, hands on stomach to engage the lower abdomen. She was able to feel the pelvic floor contract.      12/24/2021 Manual: Soft tissue mobilization:soft tissue work to the lower abdomen to release the restrictions Scar tissue mobilization:to the lower abdominal scar going through the restrictions and educated patient on how to perform at home.  Myofascial release:release through the umbilicus with movement of legs  Neuromuscular re-education: Down  training:diaphragmatic breathing to elongate the pelvic floor Exercises: Stretches/mobility:single knee to chest hold 30 sec bil.  Trunk rotation 10x each side Piriformis stretch in supine holding 30 sec each Self-care: Educated patient on vaginal moisturizers and health to improve dryness.         PATIENT EDUCATION:  12/24/2021 Education details: Access Code: [email or web address], vaginal moisturizers and vaginal health, scar massage Person educated: Patient Education method: Explanation, Demonstration, Tactile cues, Verbal cues, and Handouts Education comprehension: verbalized understanding, returned demonstration, verbal cues required, tactile cues required, and needs further education     HOME EXERCISE PROGRAM: 12/24/2021 Access Code: [email or web address] URL: [email or web address] Date: 12/24/2021 Prepared by: Earlie Counts   Program Notes glycerin suppositories into the anusvitamin E into the vaginal canal 2 times per week   Exercises - Seated Diaphragmatic Breathing  - 3 x daily - 7 x weekly - 1 sets - 10 reps - Supine Single Knee to Chest Stretch  - 1 x daily - 7 x weekly - 1 sets - 2 reps - Supine Piriformis Stretch Pulling Heel to Hip  - 1 x daily - 7 x weekly - 1 sets - 2 reps - Supine Lower Trunk Rotation  - 1 x daily - 7 x weekly - 1 sets - 10 reps     ASSESSMENT:   CLINICAL IMPRESSION: Patient is a 66 y.o. female who was seen today for physical therapy treatment for  ovreactive bladder and levator spasm.Marland KitchenUrinary urgency is 40% better. Patient reports she has not had any stool leakage and does not strain to have a bowel movement the past few weeks. Patient to contract the pelvic floor with less gluteal contraction after dry needling. She had improved tissue mobility after the dry needling. Patient will benefit from skilled  therapy to reduce muscle tightness and improve coordination.       OBJECTIVE IMPAIRMENTS decreased activity tolerance, decreased coordination, decreased  endurance, decreased strength, increased fascial restrictions, increased muscle spasms, and impaired tone.    ACTIVITY LIMITATIONS lifting, bending, standing, transfers, continence, and toileting   PARTICIPATION LIMITATIONS: community activity   PERSONAL FACTORS 3+ comorbidities:    Vaginal hysterectomy with TVH, Posterior repair 01/10/2005.  Diabetes; Sjogren's; Appendectomy; Hernia repair; tailbone removedare also affecting patient's functional outcome.    REHAB POTENTIAL: Excellent   CLINICAL DECISION MAKING: Stable/uncomplicated   EVALUATION COMPLEXITY: Low     GOALS: Goals reviewed with patient? Yes   SHORT TERM GOALS: Target date: 01/12/2022   Patient understands ways to moisturize the vaginal and anal canal to improve tissue health.  Baseline: Goal status: Met 12/24/2021   2.  Patient reports her urge to void improved >/= 25% using the behavioral technique.  Baseline:  Goal status: Met 01/07/2022   3.  Patient educated on correct toileting technique to empty her bowels better.  Baseline:  Goal status: Met 01/07/2022   LONG TERM GOALS: Target date: 03/09/2022    Patient independent with advanced HEP for pelvic floor strength and coordination exercises.  Baseline:  Goal status: INITIAL   2.  Patient reports her urinary leakage with urge to void decreased >/= 75% using the correct behavioral technique.  Baseline:  Goal status: INITIAL   3.  Patient is able to go out of the house for 2 hours or more without  a pad due to improve strength of pelvic floor >/= 3/5 and good circular hug of therapist finger.  Baseline:  Goal status: INITIAL   4.  Patient is able to have a vaginal exam with pain no more than 2/10 due to reduction of trigger points in the pelvic floor muscles and improved tissue health.  Baseline:  Goal status: INITIAL   5.  Improved mobility of the lower abdominal scar to put less pressure on the bladder to reduce her leakage.  Baseline:  Goal status:  INITIAL   PLAN: PT FREQUENCY: 1x/week   PT DURATION: 12 weeks   PLANNED INTERVENTIONS: Therapeutic exercises, Therapeutic activity, Neuromuscular re-education, Patient/Family education, Self Care, Joint mobilization, Dry Needling, Cryotherapy, Moist heat, Biofeedback, and Manual therapy   PLAN FOR NEXT SESSION: dry needling to lumbar, gluteus medius, piriformis;  bulging of the pelvic floor, manual work to the pelvic floor Earlie Counts, PT 01/07/22 10:37 AM

## 2022-01-07 NOTE — Patient Instructions (Signed)

## 2022-01-11 ENCOUNTER — Ambulatory Visit: Payer: Medicare Other | Attending: Internal Medicine

## 2022-01-11 DIAGNOSIS — E785 Hyperlipidemia, unspecified: Secondary | ICD-10-CM | POA: Diagnosis not present

## 2022-01-11 DIAGNOSIS — R7303 Prediabetes: Secondary | ICD-10-CM | POA: Diagnosis not present

## 2022-01-11 DIAGNOSIS — E162 Hypoglycemia, unspecified: Secondary | ICD-10-CM | POA: Diagnosis not present

## 2022-01-11 DIAGNOSIS — E1169 Type 2 diabetes mellitus with other specified complication: Secondary | ICD-10-CM | POA: Diagnosis not present

## 2022-01-12 ENCOUNTER — Encounter: Payer: Medicare Other | Attending: Obstetrics and Gynecology | Admitting: Physical Therapy

## 2022-01-12 ENCOUNTER — Encounter: Payer: Self-pay | Admitting: Physical Therapy

## 2022-01-12 DIAGNOSIS — R278 Other lack of coordination: Secondary | ICD-10-CM

## 2022-01-12 DIAGNOSIS — M62838 Other muscle spasm: Secondary | ICD-10-CM | POA: Diagnosis not present

## 2022-01-12 DIAGNOSIS — N3281 Overactive bladder: Secondary | ICD-10-CM | POA: Insufficient documentation

## 2022-01-12 DIAGNOSIS — M6281 Muscle weakness (generalized): Secondary | ICD-10-CM

## 2022-01-12 LAB — HEPATIC FUNCTION PANEL
ALT: 56 IU/L — ABNORMAL HIGH (ref 0–32)
AST: 47 IU/L — ABNORMAL HIGH (ref 0–40)
Albumin: 4.5 g/dL (ref 3.9–4.9)
Alkaline Phosphatase: 126 IU/L — ABNORMAL HIGH (ref 44–121)
Bilirubin Total: 0.3 mg/dL (ref 0.0–1.2)
Bilirubin, Direct: <0.1 mg/dL (ref 0.00–0.40)
Total Protein: 7 g/dL (ref 6.0–8.5)

## 2022-01-12 LAB — LIPID PANEL
Chol/HDL Ratio: 3.1 ratio (ref 0.0–4.4)
Cholesterol, Total: 156 mg/dL (ref 100–199)
HDL: 51 mg/dL (ref >39)
LDL Chol Calc (NIH): 87 mg/dL (ref 0–99)
Triglycerides: 95 mg/dL (ref 0–149)
VLDL Cholesterol Cal: 18 mg/dL (ref 5–40)

## 2022-01-12 NOTE — Therapy (Signed)
OUTPATIENT PHYSICAL THERAPY TREATMENT NOTE   Patient Name: [name] MRN: [id number] DOB:03-07-1956, 66 y.o., female Today's Date: 01/12/2022  PCP: Ladell Pier, MD REFERRING PROVIDER: Jaquita Folds, MD  END OF SESSION:   PT End of Session - 01/12/22 0839     Visit Number 5    Date for PT Re-Evaluation 03/09/22    Authorization Type [employer]    Authorization - Visit Number 5    Authorization - Number of Visits 10    PT Start Time 0830    PT Stop Time 0923    PT Time Calculation (min) 53 min    Activity Tolerance Patient tolerated treatment well    Behavior During Therapy [facility] for tasks assessed/performed             Past Medical History:  Diagnosis Date   Asthma    Diabetes mellitus without complication ([place])    Gout    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   OA (osteoarthritis)    Reflux    Sjogren's syndrome ([place])    Past Surgical History:  Procedure Laterality Date   APPENDECTOMY     BACK SURGERY  03/12/2010   L5   BREAST SURGERY     BIOPSY   HERNIA REPAIR     groin bilateral   NECK SURGERY     PELVIC LAPAROSCOPY  1224,8250   PILONIDAL CYST EXCISION     SALPINGOOPHORECTOMY  10/11/2002   SCOPE LSO-LEFT SEROUS CYSTADENOMA   SALPINGOOPHORECTOMY  04/12/2005   RSO/TOA   SHOULDER SURGERY Right 01/2018   TONSILLECTOMY AND ADENOIDECTOMY     TUBAL LIGATION     VAGINAL HYSTERECTOMY  01/10/2005   TVH, POSTERIOR REPAIR   Patient Active Problem List   Diagnosis Date Noted   Hyperlipidemia associated with type 2 diabetes mellitus ([place]) 12/24/2021   Essential hypertension 10/21/2021   Type 2 diabetes mellitus with hypoglycemia without coma, without long-term current use of insulin ([place]) 10/21/2021   Primary osteoarthritis involving multiple joints 10/21/2021   Mild intermittent asthma without complication 03/70/4888   History of herpes genitalis 10/21/2021   History of gout 10/21/2021   Gastroesophageal reflux disease without esophagitis  10/21/2021   Class 2 severe obesity with serious comorbidity and body mass index (BMI) of 36.0 to 36.9 in adult ([place]) 10/21/2021   Reflux    Asthma    Sjogren's syndrome ([facility])   REFERRING DIAG:  B16.945 (ICD-10-CM) - Levator spasm  N32.81 (ICD-10-CM) - Overactive bladder      THERAPY DIAG:  Muscle weakness (generalized)   Other lack of coordination   Rationale for Evaluation and Treatment Rehabilitation   ONSET DATE: 12/15/2020   SUBJECTIVE:  SUBJECTIVE STATEMENT: The urgency is better. I can hold the urine a little bit better. When I see the toilet the urine starts to run out. The dry needling helped but in the middle of the day it started to tighten up. The tailbone pain is better. No stool leakage when passing gas.    PRECAUTIONS: None   WEIGHT BEARING RESTRICTIONS No   FALLS:  Has patient fallen in last 6 months? No   LIVING ENVIRONMENT: Lives with: lives alone   OCCUPATION: retired   PLOF: Independent   PATIENT GOALS relax my pelvic floor.    PERTINENT HISTORY:  Vaginal hysterectomy with TVH, Posterior repair 01/10/2005.  Diabetes; Sjogren's; Appendectomy; Hernia repair; tailbone removed   Sexual abuse: No   BOWEL MOVEMENT Pain with bowel movement: No Type of bowel movement:Type (B[place] Stool Scale) Type 1, 3, 4, Frequency has one every time she eats, Strain Yes, and Splinting yes ; lean back to have a bowel movement Fully empty rectum: No Leakage: Yes: leak stool with gas Pads: No Fiber supplement: Yes: Metamucil   URINATION Pain with urination: No Fully empty bladder: Yes:   Stream: Strong Urgency: Yes:   Frequency: Day time voids 5-6.  Nocturia: 1-2 times per night to void. Leakage: Urge to void, Walking to the bathroom, Coughing, Sneezing, and Laughing; leaks 1-2 times  per day Pads: Yes: 1 pantyliner per day  when she is out for more than 2 hours     PREGNANCY Vaginal deliveries 2 Tearing No   PROLAPSE Stage I anterior, Stage I posterior, Stage I apical prolapse       OBJECTIVE:    DIAGNOSTIC FINDINGS:  MD findings: Pelvic floor strength II/V, puborectalis IV/V external anal sphincter IV/V PVR of 1 ml was obtained by bladder scan   Pelvic floor musculature: Right levator tender, Right obturator tender, Left levator tender, Left obturator tender     COGNITION:            Overall cognitive status: Within functional limits for tasks assessed                          SENSATION:            Light touch: Appears intact            Proprioception: Appears intact                  POSTURE:  scoliosis               PELVIC ALIGNMENT:   LUMBARAROM/PROM Lumbar ROM is limited by 25%     LOWER EXTREMITY ROM:   Passive ROM Right eval Left eval  Hip flexion 100 100  Hip external rotation 40 40   (Blank rows = not tested)   LOWER EXTREMITY MMT:   MMT Right eval Left eval  Hip abduction 3+/5 3+/5     PALPATION:   General  able to contract her lower abdomen correctly. Scar on the lower abdomen is tight and restricted. Restrictions around the coccyx where it was removed.                  External Perineal Exam intact, negative Q-tip test                             Internal Pelvic Floor tender on bilateral levator ani and obturator internist   Patient confirms identification and approves  PT to assess internal pelvic floor and treatment Yes   PELVIC MMT:   MMT eval 01/12/2022  Vaginal Ant. Post. 2/5 and lateral is 1/5, no circular hug of the therapist finger 3/5          TONE: increased     TODAY'S TREATMENT  01/12/2022 Manual: Internal pelvic floor techniques:No emotional/communication barriers or cognitive limitation. Patient is motivated to learn. Patient understands and agrees with treatment goals and plan. PT explains patient  will be examined in standing, sitting, and lying down to see how their muscles and joints work. When they are ready, they will be asked to remove their underwear so PT can examine their perineum. The patient is also given the option of providing their own chaperone as one is not provided in our facility. The patient also has the right and is explained the right to defer or refuse any part of the evaluation or treatment including the internal exam. With the patient's consent, PT will use one gloved finger to gently assess the muscles of the pelvic floor, seeing how well it contracts and relaxes and if there is muscle symmetry. After, the patient will get dressed and PT and patient will discuss exam findings and plan of care. PT and patient discuss plan of care, schedule, attendance policy and HEP activities.  Going through the vaginal working on the top of the bladder with one finger internally and other on the lower abdominal area to release the restrictions Manual work to bilateral iliococcygeus, ATLA, and puborectalis with movement of the hip  Manual work along the introitus  Neuromuscular re-education: Pelvic floor contraction training:therapist finger in the vaginal canal working on pelvic floor contraction after manual work and having her feel the lower abdominal contract and lift.    01/07/2022 Manual: Soft tissue mobilization:manual work to elongate the muscles including  piriformis, gluteus, lumbar multifid, coccygeus after dry needling ; Manual work to the right quadratus to reduce trigger points.  Myofascial release:using the iastim tool to work on the gluteals and piriformis and coccygeus Trigger Point Dry-Needling  Treatment instructions: Expect mild to moderate muscle soreness. S/S of pneumothorax if dry needled over a lung field, and to seek immediate medical attention should they occur. Patient verbalized understanding of these instructions and education.   Patient Consent Given:  Yes Education handout provided: Yes Muscles treated: glutesu medius, piriformis, gluteus maximus, lumbar multifidi, coccygeus Electrical stimulation performed: No Parameters: N/A Treatment response/outcome: trigger point response and elongation of muscles Manual work to assess for dry needling.  Exercises: Stretches/mobility:supine stretch for piriformis hold 15 sec bil.  Schuyler holding for 30 seconds bil.  Supine hamstring stretch hold 30 sec bil.  Therapeutic activities: Functional strengthening activities:educated patient on how to toilet correctly with proper breath to push the stool out. She is too tight in hips to use a squatty potty.  12/31/2021 Manual: Soft tissue mobilization:manual work to the lumbar paraspinals, along the quadratus, gluteals, TFL, piriformis and coccygeus in prone; manual work to the abdomen to release the tissue Scar tissue mobilization:using the suction cup and manual work to the  scar to release the tissue.  Myofascial release:suction cup to the lower abdomen to release the tissue, fascial release to the umbilicus, release suprapubically to give the bladder room to fill, lifting around the mesenteric root, release in the right lower quadrant Neuromuscular re-education: Pelvic floor contraction training:contraction of the pelvic floor in sitting without using the gluteals, hands on stomach to engage the lower abdomen. She was able  to feel the pelvic floor contract.      PATIENT EDUCATION:  12/24/2021 Education details: Access Code: [email or web address], vaginal moisturizers and vaginal health, scar massage Person educated: Patient Education method: Explanation, Demonstration, Tactile cues, Verbal cues, and Handouts Education comprehension: verbalized understanding, returned demonstration, verbal cues required, tactile cues required, and needs further education     HOME EXERCISE PROGRAM: 12/24/2021 Access Code: [email or web address] URL: [email or web address] Date:  12/24/2021 Prepared by: Earlie Counts   Program Notes glycerin suppositories into the anusvitamin E into the vaginal canal 2 times per week   Exercises - Seated Diaphragmatic Breathing  - 3 x daily - 7 x weekly - 1 sets - 10 reps - Supine Single Knee to Chest Stretch  - 1 x daily - 7 x weekly - 1 sets - 2 reps - Supine Piriformis Stretch Pulling Heel to Hip  - 1 x daily - 7 x weekly - 1 sets - 2 reps - Supine Lower Trunk Rotation  - 1 x daily - 7 x weekly - 1 sets - 10 reps     ASSESSMENT:   CLINICAL IMPRESSION: Patient is a 66 y.o. female who was seen today for physical therapy treatment for ovreactive bladder and levator spasm..Coccyx pain decreased sine the dry needling. Patient has not had stool leakage since the initial eval.  Patient was able to wait 2 hours to urinate for the first time. Pelvic floor strength increased to 3/5 with hug of therapist finger. She had restrictions above the bladder that were released. She has more trigger points on the left pelvic floor than the right. Patient will benefit from skilled  therapy to reduce muscle tightness and improve coordination.       OBJECTIVE IMPAIRMENTS decreased activity tolerance, decreased coordination, decreased endurance, decreased strength, increased fascial restrictions, increased muscle spasms, and impaired tone.    ACTIVITY LIMITATIONS lifting, bending, standing, transfers, continence, and toileting   PARTICIPATION LIMITATIONS: community activity   PERSONAL FACTORS 3+ comorbidities:    Vaginal hysterectomy with TVH, Posterior repair 01/10/2005.  Diabetes; Sjogren's; Appendectomy; Hernia repair; tailbone removedare also affecting patient's functional outcome.    REHAB POTENTIAL: Excellent   CLINICAL DECISION MAKING: Stable/uncomplicated   EVALUATION COMPLEXITY: Low     GOALS: Goals reviewed with patient? Yes   SHORT TERM GOALS: Target date: 01/12/2022   Patient understands ways to moisturize the vaginal and anal canal  to improve tissue health.  Baseline: Goal status: Met 12/24/2021   2.  Patient reports her urge to void improved >/= 25% using the behavioral technique.  Baseline:  Goal status: Met 01/07/2022   3.  Patient educated on correct toileting technique to empty her bowels better.  Baseline:  Goal status: Met 01/07/2022   LONG TERM GOALS: Target date: 03/09/2022    Patient independent with advanced HEP for pelvic floor strength and coordination exercises.  Baseline:  Goal status: INITIAL   2.  Patient reports her urinary leakage with urge to void decreased >/= 75% using the correct behavioral technique.  Baseline:  Goal status: INITIAL   3.  Patient is able to go out of the house for 2 hours or more without  a pad due to improve strength of pelvic floor >/= 3/5 and good circular hug of therapist finger.  Baseline:  Goal status: INITIAL   4.  Patient is able to have a vaginal exam with pain no more than 2/10 due to reduction of trigger points in the pelvic floor muscles and improved tissue health.  Baseline:  Goal status: INITIAL   5.  Improved mobility of the lower abdominal scar to put less pressure on the bladder to reduce her leakage.  Baseline:  Goal status: INITIAL   PLAN: PT FREQUENCY: 1x/week   PT DURATION: 12 weeks   PLANNED INTERVENTIONS: Therapeutic exercises, Therapeutic activity, Neuromuscular re-education, Patient/Family education, Self Care, Joint mobilization, Dry Needling, Cryotherapy, Moist heat, Biofeedback, and Manual therapy   PLAN FOR NEXT SESSION: dry needling to lumbar, gluteus medius, piriformis; manual work to the pelvic floor   Earlie Counts, PT 01/12/22 9:23 AM

## 2022-01-13 ENCOUNTER — Other Ambulatory Visit: Payer: Self-pay | Admitting: Internal Medicine

## 2022-01-13 DIAGNOSIS — R7989 Other specified abnormal findings of blood chemistry: Secondary | ICD-10-CM

## 2022-01-26 ENCOUNTER — Encounter: Payer: Self-pay | Admitting: Physical Therapy

## 2022-01-26 ENCOUNTER — Encounter: Payer: Medicare Other | Admitting: Physical Therapy

## 2022-01-26 DIAGNOSIS — R278 Other lack of coordination: Secondary | ICD-10-CM

## 2022-01-26 DIAGNOSIS — N3281 Overactive bladder: Secondary | ICD-10-CM | POA: Diagnosis not present

## 2022-01-26 DIAGNOSIS — M6281 Muscle weakness (generalized): Secondary | ICD-10-CM

## 2022-01-26 DIAGNOSIS — M62838 Other muscle spasm: Secondary | ICD-10-CM | POA: Diagnosis not present

## 2022-01-26 NOTE — Therapy (Signed)
OUTPATIENT PHYSICAL THERAPY TREATMENT NOTE   Patient Name: [name] MRN: [id number] DOB:17-Oct-1955, 66 y.o., female Today's Date: 01/26/2022  PCP: Ladell Pier, MD REFERRING PROVIDER: Jaquita Folds, MD  END OF SESSION:   PT End of Session - 01/26/22 1145     Visit Number 6    Date for PT Re-Evaluation 03/09/22    Authorization Type [employer]    Authorization - Visit Number 6    Authorization - Number of Visits 10    PT Start Time 1140    PT Stop Time 1220    PT Time Calculation (min) 40 min    Activity Tolerance Patient tolerated treatment well    Behavior During Therapy WFL for tasks assessed/performed             Past Medical History:  Diagnosis Date   Asthma    Diabetes mellitus without complication ([place])    Gout    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   OA (osteoarthritis)    Reflux    Sjogren's syndrome ([place])    Past Surgical History:  Procedure Laterality Date   APPENDECTOMY     BACK SURGERY  03/12/2010   L5   BREAST SURGERY     BIOPSY   HERNIA REPAIR     groin bilateral   NECK SURGERY     PELVIC LAPAROSCOPY  [id number]   PILONIDAL CYST EXCISION     SALPINGOOPHORECTOMY  10/11/2002   SCOPE LSO-LEFT SEROUS CYSTADENOMA   SALPINGOOPHORECTOMY  04/12/2005   RSO/TOA   SHOULDER SURGERY Right 01/2018   TONSILLECTOMY AND ADENOIDECTOMY     TUBAL LIGATION     VAGINAL HYSTERECTOMY  01/10/2005   TVH, POSTERIOR REPAIR   Patient Active Problem List   Diagnosis Date Noted   Hyperlipidemia associated with type 2 diabetes mellitus ([place]) 12/24/2021   Essential hypertension 10/21/2021   Type 2 diabetes mellitus with hypoglycemia without coma, without long-term current use of insulin ([place]) 10/21/2021   Primary osteoarthritis involving multiple joints 10/21/2021   Mild intermittent asthma without complication 32/20/2542   History of herpes genitalis 10/21/2021   History of gout 10/21/2021   Gastroesophageal reflux disease without esophagitis  10/21/2021   Class 2 severe obesity with serious comorbidity and body mass index (BMI) of 36.0 to 36.9 in adult ([place]) 10/21/2021   Reflux    Asthma    Sjogren's syndrome ([facility])    REFERRING DIAG:  H06.237 (ICD-10-CM) - Levator spasm  N32.81 (ICD-10-CM) - Overactive bladder      THERAPY DIAG:  Muscle weakness (generalized)   Other lack of coordination   Rationale for Evaluation and Treatment Rehabilitation   ONSET DATE: 12/15/2020   SUBJECTIVE:  SUBJECTIVE STATEMENT: I am not leaking on myself and make it to the bathroom. I have leaked 1 time since last visit. I was able to wait more than 2 hours to urinate. Patient only wears a pantyliner when she is going outside of [place] and it is keeping dry.    PRECAUTIONS: None   WEIGHT BEARING RESTRICTIONS No   FALLS:  Has patient fallen in last 6 months? No   LIVING ENVIRONMENT: Lives with: lives alone   OCCUPATION: retired   PLOF: Independent   PATIENT GOALS relax my pelvic floor.    PERTINENT HISTORY:  Vaginal hysterectomy with TVH, Posterior repair 01/10/2005.  Diabetes; Sjogren's; Appendectomy; Hernia repair; tailbone removed   Sexual abuse: No   BOWEL MOVEMENT Pain with bowel movement: No Type of bowel movement:Type (B[place] Stool Scale) Type 1, 3, 4, Frequency has one every time she eats, Strain Yes, and Splinting yes ; lean back to have a bowel movement Fully empty rectum: No Leakage: Yes: leak stool with gas Pads: No Fiber supplement: Yes: Metamucil   URINATION Pain with urination: No Fully empty bladder: Yes:   Stream: Strong Urgency: Yes:   Frequency: Day time voids 5-6.  Nocturia: 1-2 times per night to void. Leakage: Urge to void, Walking to the bathroom, Coughing, Sneezing, and Laughing; leaks 1-2 times per day Pads:  Yes: 1 pantyliner per day  when she is out for more than 2 hours     PREGNANCY Vaginal deliveries 2 Tearing No   PROLAPSE Stage I anterior, Stage I posterior, Stage I apical prolapse       OBJECTIVE:    DIAGNOSTIC FINDINGS:  MD findings: Pelvic floor strength II/V, puborectalis IV/V external anal sphincter IV/V PVR of 1 ml was obtained by bladder scan   Pelvic floor musculature: Right levator tender, Right obturator tender, Left levator tender, Left obturator tender     COGNITION:            Overall cognitive status: Within functional limits for tasks assessed                          SENSATION:            Light touch: Appears intact            Proprioception: Appears intact                  POSTURE:  scoliosis               PELVIC ALIGNMENT:   LUMBARAROM/PROM Lumbar ROM is limited by 25%     LOWER EXTREMITY ROM:   Passive ROM Right eval Left eval  Hip flexion 100 100  Hip external rotation 40 40   (Blank rows = not tested)   LOWER EXTREMITY MMT:   MMT Right eval Left eval  Hip abduction 3+/5 3+/5     PALPATION:   General  able to contract her lower abdomen correctly. Scar on the lower abdomen is tight and restricted. Restrictions around the coccyx where it was removed.                  External Perineal Exam intact, negative Q-tip test                             Internal Pelvic Floor tender on bilateral levator ani and obturator internist   Patient confirms identification and approves PT to assess  internal pelvic floor and treatment Yes   PELVIC MMT:   MMT eval 01/12/2022  Vaginal Ant. Post. 2/5 and lateral is 1/5, no circular hug of the therapist finger 3/5          TONE: increased     TODAY'S TREATMENT  01/26/2022 Manual: Soft tissue mobilization:To assess for dry needling, using the addaday to the lumbar paraspinals, quadratus, gluteals,  Trigger Point Dry-Needling  Treatment instructions: Expect mild to moderate muscle soreness. S/S of  pneumothorax if dry needled over a lung field, and to seek immediate medical attention should they occur. Patient verbalized understanding of these instructions and education.  Patient Consent Given: Yes Education handout provided: Previously provided Muscles treated: lumbar multifidi, gluteus medius, gluteus maximus and minimus and along the border of the SI joint Electrical stimulation performed: No Parameters: N/A Treatment response/outcome: elongation of the muscles and trigger point response Placed moist heat on the back and gluteals for 10 min after dry needling Exercises: Strengthening:verbally reviewed patient HEP and going over on not contracting the gluteals. She feels she is doing well  with the exercises    01/12/2022 Manual: Internal pelvic floor techniques:No emotional/communication barriers or cognitive limitation. Patient is motivated to learn. Patient understands and agrees with treatment goals and plan. PT explains patient will be examined in standing, sitting, and lying down to see how their muscles and joints work. When they are ready, they will be asked to remove their underwear so PT can examine their perineum. The patient is also given the option of providing their own chaperone as one is not provided in our facility. The patient also has the right and is explained the right to defer or refuse any part of the evaluation or treatment including the internal exam. With the patient's consent, PT will use one gloved finger to gently assess the muscles of the pelvic floor, seeing how well it contracts and relaxes and if there is muscle symmetry. After, the patient will get dressed and PT and patient will discuss exam findings and plan of care. PT and patient discuss plan of care, schedule, attendance policy and HEP activities.  Going through the vaginal working on the top of the bladder with one finger internally and other on the lower abdominal area to release the restrictions Manual  work to bilateral iliococcygeus, ATLA, and puborectalis with movement of the hip  Manual work along the introitus  Neuromuscular re-education: Pelvic floor contraction training:therapist finger in the vaginal canal working on pelvic floor contraction after manual work and having her feel the lower abdominal contract and lift.      01/07/2022 Manual: Soft tissue mobilization:manual work to elongate the muscles including  piriformis, gluteus, lumbar multifid, coccygeus after dry needling ; Manual work to the right quadratus to reduce trigger points.  Myofascial release:using the iastim tool to work on the gluteals and piriformis and coccygeus Trigger Point Dry-Needling  Treatment instructions: Expect mild to moderate muscle soreness. S/S of pneumothorax if dry needled over a lung field, and to seek immediate medical attention should they occur. Patient verbalized understanding of these instructions and education.   Patient Consent Given: Yes Education handout provided: Yes Muscles treated: glutesu medius, piriformis, gluteus maximus, lumbar multifidi, coccygeus Electrical stimulation performed: No Parameters: N/A Treatment response/outcome: trigger point response and elongation of muscles Manual work to assess for dry needling.  Exercises: Stretches/mobility:supine stretch for piriformis hold 15 sec bil.  Tatum holding for 30 seconds bil.  Supine hamstring stretch hold 30 sec bil.  Therapeutic activities: Functional strengthening activities:educated patient on how to toilet correctly with proper breath to push the stool out. She is too tight in hips to use a squatty potty.    PATIENT EDUCATION:  12/24/2021 Education details: Access Code: [email or web address], vaginal moisturizers and vaginal health, scar massage Person educated: Patient Education method: Explanation, Demonstration, Tactile cues, Verbal cues, and Handouts Education comprehension: verbalized understanding, returned demonstration, verbal  cues required, tactile cues required, and needs further education     HOME EXERCISE PROGRAM: 12/24/2021 Access Code: [email or web address] URL: [email or web address] Date: 12/24/2021 Prepared by: Earlie Counts   Program Notes glycerin suppositories into the anusvitamin E into the vaginal canal 2 times per week   Exercises - Seated Diaphragmatic Breathing  - 3 x daily - 7 x weekly - 1 sets - 10 reps - Supine Single Knee to Chest Stretch  - 1 x daily - 7 x weekly - 1 sets - 2 reps - Supine Piriformis Stretch Pulling Heel to Hip  - 1 x daily - 7 x weekly - 1 sets - 2 reps - Supine Lower Trunk Rotation  - 1 x daily - 7 x weekly - 1 sets - 10 reps     ASSESSMENT:   CLINICAL IMPRESSION: Patient is a 66 y.o. female who was seen today for physical therapy treatment for ovreactive bladder and levator spasm..Patient has only leaked 1 time since her last visit. She is able to hold her urine more than 2 hours now. She is able to walk to the bathroom with out leaking.  Patient now only wears a panty liner when she is going out of the hours for more than 2 hours. She know to contract the pelvic lfoor muscles instead of the gluteals. Patient will benefit from skilled  therapy to reduce muscle tightness and improve coordination.       OBJECTIVE IMPAIRMENTS decreased activity tolerance, decreased coordination, decreased endurance, decreased strength, increased fascial restrictions, increased muscle spasms, and impaired tone.    ACTIVITY LIMITATIONS lifting, bending, standing, transfers, continence, and toileting   PARTICIPATION LIMITATIONS: community activity   PERSONAL FACTORS 3+ comorbidities:    Vaginal hysterectomy with TVH, Posterior repair 01/10/2005.  Diabetes; Sjogren's; Appendectomy; Hernia repair; tailbone removedare also affecting patient's functional outcome.    REHAB POTENTIAL: Excellent   CLINICAL DECISION MAKING: Stable/uncomplicated   EVALUATION COMPLEXITY: Low     GOALS: Goals  reviewed with patient? Yes   SHORT TERM GOALS: Target date: 01/12/2022   Patient understands ways to moisturize the vaginal and anal canal to improve tissue health.  Baseline: Goal status: Met 12/24/2021   2.  Patient reports her urge to void improved >/= 25% using the behavioral technique.  Baseline:  Goal status: Met 01/07/2022   3.  Patient educated on correct toileting technique to empty her bowels better.  Baseline:  Goal status: Met 01/07/2022   LONG TERM GOALS: Target date: 03/09/2022    Patient independent with advanced HEP for pelvic floor strength and coordination exercises.  Baseline:  Goal status: ongoing 01/26/2022   2.  Patient reports her urinary leakage with urge to void decreased >/= 75% using the correct behavioral technique.  Baseline:  Goal status: INITIAL   3.  Patient is able to go out of the house for 2 hours or more without  a pad due to improve strength of pelvic floor >/= 3/5 and good circular hug of therapist finger.  Baseline:  Goal status: ongoing 01/26/2022   4.  Patient is able to have a  vaginal exam with pain no more than 2/10 due to reduction of trigger points in the pelvic floor muscles and improved tissue health.  Baseline:  Goal status: INITIAL   5.  Improved mobility of the lower abdominal scar to put less pressure on the bladder to reduce her leakage.  Baseline:  Goal status: Met 01/26/2022   PLAN: PT FREQUENCY: 1x/week   PT DURATION: 12 weeks   PLANNED INTERVENTIONS: Therapeutic exercises, Therapeutic activity, Neuromuscular re-education, Patient/Family education, Self Care, Joint mobilization, Dry Needling, Cryotherapy, Moist heat, Biofeedback, and Manual therapy   PLAN FOR NEXT SESSION: dry needling to lumbar, gluteus medius, piriformis; manual work to the pelvic floor; review HEP and see if ready for discharge.     Earlie Counts, PT 01/26/22 12:41 PM

## 2022-01-29 ENCOUNTER — Ambulatory Visit: Payer: Medicare Other | Attending: Internal Medicine | Admitting: Pharmacist

## 2022-01-29 ENCOUNTER — Encounter: Payer: Self-pay | Admitting: Pharmacist

## 2022-01-29 VITALS — BP 112/71 | HR 67 | Temp 98.6°F | Ht 67.0 in | Wt 231.0 lb

## 2022-01-29 DIAGNOSIS — Z23 Encounter for immunization: Secondary | ICD-10-CM | POA: Diagnosis not present

## 2022-01-29 DIAGNOSIS — Z0000 Encounter for general adult medical examination without abnormal findings: Secondary | ICD-10-CM

## 2022-01-29 DIAGNOSIS — Z114 Encounter for screening for human immunodeficiency virus [HIV]: Secondary | ICD-10-CM | POA: Diagnosis not present

## 2022-01-29 DIAGNOSIS — Z1159 Encounter for screening for other viral diseases: Secondary | ICD-10-CM | POA: Diagnosis not present

## 2022-01-29 MED ORDER — FREESTYLE LIBRE SENSOR SYSTEM MISC: 12 refills | Status: DC

## 2022-01-29 NOTE — Progress Notes (Signed)
Subjective:   [name] is a 66 y.o. female who presents for Medicare Annual (Subsequent) preventive examination.     Objective:    Today's Vitals   01/29/22 1040 01/29/22 1044  BP: 112/71   Pulse: 67   Temp: 98.6 F (37 C)   SpO2: 98%   Weight: 231 lb (104.8 kg)   Height: '5\' 7"'$  (1.702 m)   PainSc: 5  5   PainLoc: Hand    Body mass index is 36.18 kg/m.     01/29/2022   11:03 AM 12/15/2021    1:06 PM  Advanced Directives  Does Patient Have a Medical Advance Directive? Yes Yes  Type of Advance Directive Living will [place];Living will  Does patient want to make changes to medical advance directive?  No - Patient declined  Copy of [place] in Chart?  No - copy requested    Current Medications (verified) Outpatient Encounter Medications as of 01/29/2022  Medication Sig   acetaminophen (TYLENOL) 650 MG CR tablet Take 650 mg by mouth as needed.     albuterol (VENTOLIN HFA) 108 (90 Base) MCG/ACT inhaler Inhale 2 puffs into the lungs every 6 (six) hours as needed for wheezing or shortness of breath.   allopurinol (ZYLOPRIM) 100 MG tablet Take 1 tablet (100 mg total) by mouth daily.   ascorbic acid (VITAMIN C) 500 MG tablet Take 500 mg by mouth daily.   atorvastatin (LIPITOR) 10 MG tablet Take 1 tablet (10 mg total) by mouth daily.   b complex vitamins capsule Take 1 capsule by mouth daily. 1 chewable once daily.   BIOTIN PO Take by mouth.   Calcium 500-2.5 MG-MCG CHEW Chew 2 tablets by mouth daily.   Cetirizine HCl (ZYRTEC PO) Take 10 mg by mouth.    Cholecalciferol (VITAMIN D PO) Take by mouth. TAKES 2000    Cinnamon 500 MG TABS Take 2 tablets by mouth daily.   cyclobenzaprine (FLEXERIL) 10 MG tablet Take 1 tablet (10 mg total) by mouth 3 (three) times daily as needed.   cycloSPORINE (RESTASIS) 0.05 % ophthalmic emulsion 1 drop 2 (two) times daily.   dexlansoprazole (DEXILANT) 60 MG capsule Take 1 capsule (60 mg total) by mouth  every morning.   diclofenac Sodium (VOLTAREN) 1 % GEL Apply 2 g topically daily. Once daily in the morning as needed.   EPINEPHrine 0.3 mg/0.3 mL IJ SOAJ injection Inject 0.3 mg into the muscle as needed for anaphylaxis.   ergocalciferol (VITAMIN D2) 1.25 MG (50000 UT) capsule Take 1 capsule (50,000 Units total) by mouth once a week.   Glucose 15 g PACK Take by mouth. Chew up to 4 tablets prn.   hydrochlorothiazide (HYDRODIURIL) 25 MG tablet Take 1 tablet (25 mg total) by mouth 2 (two) times daily.   ipratropium-albuterol (DUONEB) 0.5-2.5 (3) MG/3ML SOLN Take 3 mLs by nebulization every 6 (six) hours as needed.   meclizine (ANTIVERT) 25 MG tablet Take 1 tablet (25 mg total) by mouth 2 (two) times daily as needed for dizziness.   meloxicam (MOBIC) 15 MG tablet Take 1 tablet (15 mg total) by mouth daily.   montelukast (SINGULAIR) 10 MG tablet Take 1 tablet (10 mg total) by mouth at bedtime.   Multiple Vitamins-Iron (MULTIVITAMIN/IRON PO) Take by mouth.     nebivolol (BYSTOLIC) 5 MG tablet Take 1 tablet (5 mg total) by mouth every morning.   polyethylene glycol (MIRALAX / GLYCOLAX) 17 g packet Take 17 g by mouth  daily.   traMADol (ULTRAM) 50 MG tablet Take 50 mg by mouth every 6 (six) hours as needed.   triamcinolone (NASACORT) 55 MCG/ACT nasal inhaler Place 2 sprays into the nose as needed.     valACYclovir (VALTREX) 500 MG tablet Take 1 tablet (500 mg total) by mouth daily.   vitamin E 180 MG (400 UNITS) capsule Take 400 Units by mouth in the morning and at bedtime.   [DISCONTINUED] Continuous Blood Gluc Sensor (FREESTYLE LIBRE SENSOR SYSTEM) MISC Change sensor Q 2 wks   Continuous Blood Gluc Sensor (FREESTYLE LIBRE SENSOR SYSTEM) MISC Change sensor Q 2 wks   No facility-administered encounter medications on file as of 01/29/2022.    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   OA (osteoarthritis)    Reflux    Sjogren's syndrome ([facility])    Past Surgical History:  Procedure Laterality Date   APPENDECTOMY     BACK SURGERY  03/12/2010   L5   BREAST SURGERY     BIOPSY   HERNIA REPAIR     groin bilateral   NECK SURGERY     PELVIC LAPAROSCOPY  [id number]   PILONIDAL CYST EXCISION     SALPINGOOPHORECTOMY  10/11/2002   SCOPE LSO-LEFT SEROUS CYSTADENOMA   SALPINGOOPHORECTOMY  04/12/2005   RSO/TOA   SHOULDER SURGERY Right 01/2018   TONSILLECTOMY AND ADENOIDECTOMY     TUBAL LIGATION     VAGINAL HYSTERECTOMY  01/10/2005   TVH, POSTERIOR REPAIR   Family History  Problem Relation Age of Onset   Heart disease Mother    Arthritis Mother    Hyperlipidemia Mother    Heart disease Father    Hypertension Father    Cancer Father        Prostate and lung   Breast cancer Maternal Aunt        LATE 50'S   Ovarian cancer Maternal Aunt    Cancer Paternal Grandmother        COLON CA   Social History   Socioeconomic History   Marital status: Single    Spouse name: Not on file   Number of children: Not on file   Years of education: Not on file   Highest education level: Not on file  Occupational History   Not on file  Tobacco Use   Smoking status: Former   Smokeless tobacco: Never  Vaping Use   Vaping Use: Never used  Substance and Sexual Activity   Alcohol use: Yes    Comment: Rare   Drug use: No   Sexual activity: Not Currently    Birth control/protection: Post-menopausal  Other Topics Concern   Not on file  Social History Narrative   Not on file   Social Determinants of Health   Financial Resource Strain: Not on file  Food Insecurity: Not on file  Transportation Needs: Not on file  Physical Activity: Not on file   Stress: Not on file  Social Connections: Not on file    Tobacco Counseling Counseling given: No   Clinical Intake:  Pre-visit preparation completed: No  Pain : 0-10 Pain Score: 5  Pain Type: Chronic pain Pain Location: Hand Pain Descriptors / Indicators: Constant Pain Onset: Other (comment) Pain Frequency: Constant  Diabetes: Yes CBG done?: No Did pt. bring in CBG monitor from home?: No  How often do you need to have someone help you when you read instructions, pamphlets, or other written materials from your doctor or pharmacy?: 1 - Never  Diabetic?Yes  Interpreter Needed?: No  Information entered by :: [place]   Activities of Daily Living    01/29/2022   11:06 AM  In your present state of health, do you have any difficulty performing the following activities:  Hearing? 0  Vision? 1  Difficulty concentrating or making decisions? 0  Walking or climbing stairs? 0  Doing errands, shopping? 0  Preparing Food and eating ? N  Using the Toilet? N  In the past six months, have you accidently leaked urine? Y  Comment Sees PT for pelvic floor exercises  Do you have problems with loss of bowel control? N  Managing your Medications? N  Managing your Finances? N  Housekeeping or managing your Housekeeping? N    Patient Care Team: Ladell Pier, MD as PCP - General (Internal Medicine)  Indicate any recent Medical Services you may have received from other than [facility] providers in the past year (date may be approximate).     Assessment:   This is a routine wellness examination for [place].  Hearing/Vision screen No results found.  Dietary issues and exercise activities discussed: Current Exercise Habits: The patient does not participate in regular exercise at present, Exercise limited by: orthopedic condition(s) (Arthritic pain)   Goals Addressed   None   Depression Screen    01/29/2022   11:04 AM 12/24/2021   11:32 AM  PHQ 2/9 Scores  PHQ - 2 Score 1  2  PHQ- 9 Score 1 4    Fall Risk    01/29/2022   11:04 AM 12/24/2021   10:03 AM 10/20/2021    2:00 PM  [place] in the past year? 0 0 0  Number falls in past yr: 0 0 0  Injury with Fall? 0 0 0  Risk for fall due to :  No Fall Risks No Fall Risks  Follow up Falls evaluation completed;Education provided;Falls prevention discussed Falls evaluation completed Follow up appointment    FALL RISK PREVENTION PERTAINING TO THE HOME:  Any stairs in or around the home? No  If so, are there any without handrails? No  Home free of loose throw rugs in walkways, pet beds, electrical cords, etc? Yes  Adequate lighting in your home to reduce risk of falls? Yes   ASSISTIVE DEVICES UTILIZED TO PREVENT FALLS:  Life alert? No  Use of a cane, walker or w/c? No  Grab bars in the bathroom? No  Shower chair or bench in shower? No  Elevated toilet seat or a handicapped toilet? No   TIMED UP AND GO:  Was the test performed? Yes .  Length of time to ambulate 10 feet: 7 sec.   Gait slow and steady without use of assistive device  Cognitive Function:    01/29/2022   11:09 AM  MMSE - Mini Mental State Exam  Orientation to time 5  Orientation to Place 5  Registration 3  Attention/ Calculation 5  Recall 3  Language- name 2 objects 2  Language- repeat 1  Language- follow 3 step command 3  Language- read & follow direction 1  Write a sentence 1  Copy design 1  Total score 30        Immunizations Immunization History  Administered  Date(s) Administered   Influenza,inj,Quad PF,6+ Mos 12/24/2021   PNEUMOCOCCAL CONJUGATE-20 12/24/2021   Tdap 01/29/2022    TDAP status: Completed at today's visit  Flu Vaccine status: Up to date  Pneumococcal vaccine status: Up to date  Covid-19 vaccine status: Declined, Education has been provided regarding the importance of this vaccine but patient still declined. Advised may receive this vaccine at local pharmacy or Health Dept.or vaccine  clinic. Aware to provide a copy of the vaccination record if obtained from local pharmacy or Health Dept. Verbalized acceptance and understanding.  Qualifies for Shingles Vaccine? Yes   Zostavax completed No   Shingrix Completed?: No.    Education has been provided regarding the importance of this vaccine. Patient has been advised to call insurance company to determine out of pocket expense if they have not yet received this vaccine. Advised may also receive vaccine at local pharmacy or Health Dept. Verbalized acceptance and understanding.  Screening Tests Health Maintenance  Topic Date Due   COVID-19 Vaccine (1) Never done   FOOT EXAM  Never done   Diabetic kidney evaluation - Urine ACR  Never done   Hepatitis C Screening  Never done   Zoster Vaccines- Shingrix (1 of 2) Never done   HEMOGLOBIN A1C  04/22/2022   OPHTHALMOLOGY EXAM  10/07/2022   Diabetic kidney evaluation - GFR measurement  10/21/2022   MAMMOGRAM  05/14/2023   COLONOSCOPY (Pts 45-40yr Insurance coverage will need to be confirmed)  05/14/2031   TETANUS/TDAP  01/30/2032   Pneumonia Vaccine 65 Years old  Completed   INFLUENZA VACCINE  Completed   DEXA SCAN  Completed   HIV Screening  Completed   HPV VACCINES  Aged Out    Health Maintenance  Health Maintenance Due  Topic Date Due   COVID-19 Vaccine (1) Never done   FOOT EXAM  Never done   Diabetic kidney evaluation - Urine ACR  Never done   Hepatitis C Screening  Never done   Zoster Vaccines- Shingrix (1 of 2) Never done    Colorectal cancer screening: Type of screening: Colonoscopy. Completed 05/2021. Repeat every 10 years  Mammogram status: Completed 05/2021. Repeat every year. Pt unable to recall results.   Lung Cancer Screening: (Low Dose CT Chest recommended if Age 65-80years, 30 pack-year currently smoking OR have quit w/in 15years.) does not qualify.   Lung Cancer Screening Referral: none needed   Additional Screening:  Hepatitis C Screening: does  qualify; Completed today.  Vision Screening: Recommended annual ophthalmology exams for early detection of glaucoma and other disorders of the eye. Is the patient up to date with their annual eye exam?  Yes   Dental Screening: Recommended annual dental exams for proper oral hygiene  Community Resource Referral / Chronic Care Management: CRR required this visit?  No   CCM required this visit?  No      Plan:     I have personally reviewed and noted the following in the patient's chart:   Medical and social history Use of alcohol, tobacco or illicit drugs  Current medications and supplements including opioid prescriptions. Patient is currently taking opioid prescriptions. Information provided to patient regarding non-opioid alternatives. Patient advised to discuss non-opioid treatment plan with their provider. Functional ability and status Nutritional status Physical activity Advanced directives List of other physicians Hospitalizations, surgeries, and ER visits in previous 12 months Vitals Screenings to include cognitive, depression, and falls Referrals and appointments  In addition, I have reviewed and discussed with patient certain  preventive protocols, quality metrics, and best practice recommendations. A written personalized care plan for preventive services as well as general preventive health recommendations were provided to patient.  Tresa Endo, RPH-CPP   01/29/2022

## 2022-01-30 LAB — HCV RNA QUANT RFLX ULTRA OR GENOTYP: HCV Quant Baseline: NOT DETECTED IU/mL

## 2022-01-30 LAB — HIV ANTIBODY (ROUTINE TESTING W REFLEX): HIV Screen 4th Generation wRfx: NONREACTIVE

## 2022-02-01 DIAGNOSIS — H04129 Dry eye syndrome of unspecified lacrimal gland: Secondary | ICD-10-CM | POA: Diagnosis not present

## 2022-02-01 DIAGNOSIS — M79642 Pain in left hand: Secondary | ICD-10-CM | POA: Diagnosis not present

## 2022-02-01 DIAGNOSIS — M3500 Sicca syndrome, unspecified: Secondary | ICD-10-CM | POA: Diagnosis not present

## 2022-02-01 DIAGNOSIS — R768 Other specified abnormal immunological findings in serum: Secondary | ICD-10-CM | POA: Diagnosis not present

## 2022-02-01 DIAGNOSIS — M2540 Effusion, unspecified joint: Secondary | ICD-10-CM | POA: Diagnosis not present

## 2022-02-01 DIAGNOSIS — M79641 Pain in right hand: Secondary | ICD-10-CM | POA: Diagnosis not present

## 2022-02-01 DIAGNOSIS — M79672 Pain in left foot: Secondary | ICD-10-CM | POA: Diagnosis not present

## 2022-02-01 DIAGNOSIS — M2560 Stiffness of unspecified joint, not elsewhere classified: Secondary | ICD-10-CM | POA: Diagnosis not present

## 2022-02-01 DIAGNOSIS — M79671 Pain in right foot: Secondary | ICD-10-CM | POA: Diagnosis not present

## 2022-02-01 DIAGNOSIS — M1991 Primary osteoarthritis, unspecified site: Secondary | ICD-10-CM | POA: Diagnosis not present

## 2022-02-04 ENCOUNTER — Encounter: Payer: Self-pay | Admitting: Physical Therapy

## 2022-02-09 ENCOUNTER — Encounter: Payer: Self-pay | Admitting: Physical Therapy

## 2022-02-09 ENCOUNTER — Encounter: Payer: Medicare Other | Admitting: Physical Therapy

## 2022-02-09 ENCOUNTER — Ambulatory Visit (INDEPENDENT_AMBULATORY_CARE_PROVIDER_SITE_OTHER): Payer: Medicare Other | Admitting: Obstetrics and Gynecology

## 2022-02-09 ENCOUNTER — Encounter: Payer: Self-pay | Admitting: Obstetrics and Gynecology

## 2022-02-09 VITALS — BP 115/78 | HR 73

## 2022-02-09 DIAGNOSIS — R278 Other lack of coordination: Secondary | ICD-10-CM | POA: Diagnosis not present

## 2022-02-09 DIAGNOSIS — M6281 Muscle weakness (generalized): Secondary | ICD-10-CM | POA: Diagnosis not present

## 2022-02-09 DIAGNOSIS — M62838 Other muscle spasm: Secondary | ICD-10-CM | POA: Diagnosis not present

## 2022-02-09 DIAGNOSIS — N3281 Overactive bladder: Secondary | ICD-10-CM

## 2022-02-09 DIAGNOSIS — R310 Gross hematuria: Secondary | ICD-10-CM | POA: Diagnosis not present

## 2022-02-09 NOTE — Progress Notes (Signed)
[facility] Return Visit  SUBJECTIVE  History of Present Illness: [name] is a 66 y.o. female seen in follow-up for overactive bladder. Plan at last visit was to start pelvic physical therapy.   She attended 6 visits of pelvic PT (has more scheduled). She feels that she can hold her urine when she gets to the bathroom now. Still waking 1-2 times at night but drinking water around 11-12 pm.   Feels that she may have passed a kidney stone last week. When she urinated, she had seen blood, back pain in her left kidney, and she saw "sand grains" in the toilet. Pain improved after that episode.   Past Medical History: Patient  has a past medical history of Asthma, Diabetes mellitus without complication ([name]), Gout, HSV-2 infection (04/12/2009), OA (osteoarthritis), Reflux, and Sjogren's syndrome ([name]).   Past Surgical History: She  has a past surgical history that includes Back surgery (03/12/2010); Tonsillectomy and adenoidectomy; Breast surgery; Tubal ligation; Pelvic laparoscopy (9562,1308); Pilonidal cyst excision; Vaginal hysterectomy (01/10/2005); Salpingoophorectomy (10/11/2002); Salpingoophorectomy (04/12/2005); Appendectomy; Neck surgery; Shoulder surgery (Right, 01/2018); and Hernia repair.   Medications: She has a current medication list which includes the following prescription(s): acetaminophen, albuterol, allopurinol, ascorbic acid, atorvastatin, b complex vitamins, biotin, calcium, cetirizine hcl, vitamin d, cinnamon, freestyle libre sensor system, cyclobenzaprine, cyclosporine, dexlansoprazole, diclofenac sodium, epinephrine, ergocalciferol, glucose, hydrochlorothiazide, hydroxychloroquine, ipratropium-albuterol, meclizine, meloxicam, montelukast, multiple vitamins-iron, nebivolol, polyethylene glycol, triamcinolone, valacyclovir, vitamin e, and tramadol.   Allergies: Patient is allergic to codeine, duratuss [phenylephrine-guaifenesin], erythromycin,  hydrocodone-acetaminophen, iodine, latex, oxycodone, peanuts [nuts], penicillins, poultry meal, shellfish allergy, and vicodin [hydrocodone-acetaminophen].   Social History: Patient  reports that she has quit smoking. She has never used smokeless tobacco. She reports current alcohol use. She reports that she does not use drugs.      OBJECTIVE     Physical Exam: Vitals:   02/09/22 1034  BP: 115/78  Pulse: 73   Gen: No apparent distress, A&O x 3.  Detailed Urogynecologic Evaluation:  Deferred.    ASSESSMENT AND PLAN    [name] is a 66 y.o. with:  1. Gross hematuria   2. Overactive bladder    - For hematuria, will order CT Abdomen and pelvic w/wo contrast. She will also need a cystoscopy in office to assess the bladder. If scan does show stones, we discussed she will need referral to Urology.  - For OAB, she feels she has had enough improvement with pelvic PT that she does not need further treatment. She will continue to do the exercises at home.   Jaquita Folds, MD

## 2022-02-09 NOTE — Therapy (Signed)
OUTPATIENT PHYSICAL THERAPY TREATMENT NOTE   Patient Name: [name] MRN: [id number] DOB:April 03, 1956, 66 y.o., female Today's Date: 02/09/2022  PCP: Ladell Pier, MD REFERRING PROVIDER: Jaquita Folds, MD  END OF SESSION:   PT End of Session - 02/09/22 1141     Visit Number 7    Date for PT Re-Evaluation 03/09/22    Authorization Type [employer] Medicare    Authorization - Visit Number 7    Authorization - Number of Visits 10    PT Start Time 1130    PT Stop Time 1215    PT Time Calculation (min) 45 min    Activity Tolerance Patient tolerated treatment well    Behavior During Therapy [facility] for tasks assessed/performed             Past Medical History:  Diagnosis Date   Asthma    Diabetes mellitus without complication ([place])    Gout    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   OA (osteoarthritis)    Reflux    Sjogren's syndrome ([place])    Past Surgical History:  Procedure Laterality Date   APPENDECTOMY     BACK SURGERY  03/12/2010   L5   BREAST SURGERY     BIOPSY   HERNIA REPAIR     groin bilateral   NECK SURGERY     PELVIC LAPAROSCOPY  4081,4481   PILONIDAL CYST EXCISION     SALPINGOOPHORECTOMY  10/11/2002   SCOPE LSO-LEFT SEROUS CYSTADENOMA   SALPINGOOPHORECTOMY  04/12/2005   RSO/TOA   SHOULDER SURGERY Right 01/2018   TONSILLECTOMY AND ADENOIDECTOMY     TUBAL LIGATION     VAGINAL HYSTERECTOMY  01/10/2005   TVH, POSTERIOR REPAIR   Patient Active Problem List   Diagnosis Date Noted   Hyperlipidemia associated with type 2 diabetes mellitus ([place]) 12/24/2021   Essential hypertension 10/21/2021   Type 2 diabetes mellitus with hypoglycemia without coma, without long-term current use of insulin ([place]) 10/21/2021   Primary osteoarthritis involving multiple joints 10/21/2021   Mild intermittent asthma without complication 85/63/1497   History of herpes genitalis 10/21/2021   History of gout 10/21/2021   Gastroesophageal reflux disease without esophagitis  10/21/2021   Class 2 severe obesity with serious comorbidity and body mass index (BMI) of 36.0 to 36.9 in adult ([place]) 10/21/2021   Reflux    Asthma    Sjogren's syndrome (HCC)    REFERRING DIAG:  W26.378 (ICD-10-CM) - Levator spasm  N32.81 (ICD-10-CM) - Overactive bladder      THERAPY DIAG:  Muscle weakness (generalized)   Other lack of coordination   Rationale for Evaluation and Treatment Rehabilitation   ONSET DATE: 12/15/2020   SUBJECTIVE:  SUBJECTIVE STATEMENT: I have passed a kidney stone. MD wants me to have CAT scan.    PRECAUTIONS: None   WEIGHT BEARING RESTRICTIONS No   FALLS:  Has patient fallen in last 6 months? No   LIVING ENVIRONMENT: Lives with: lives alone   OCCUPATION: retired   PLOF: Independent   PATIENT GOALS relax my pelvic floor.    PERTINENT HISTORY:  Vaginal hysterectomy with TVH, Posterior repair 01/10/2005.  Diabetes; Sjogren's; Appendectomy; Hernia repair; tailbone removed   Sexual abuse: No   BOWEL MOVEMENT Pain with bowel movement: No Type of bowel movement:Type (Bristol Stool Scale) Type 1, 3, 4, Frequency has one every time she eats, Strain Yes, and Splinting yes ; lean back to have a bowel movement Fully empty rectum: No Leakage: Yes: leak stool with gas Pads: No Fiber supplement: Yes: Metamucil   URINATION Pain with urination: No Fully empty bladder: Yes:   Stream: Strong Urgency: Yes:   Frequency: Day time voids 5-6.  Nocturia: 1-2 times per night to void. Leakage: Urge to void, Walking to the bathroom, Pads: Yes: 1 pantyliner per day  when she is out for more than 2 hours     PREGNANCY Vaginal deliveries 2 Tearing No   PROLAPSE Stage I anterior, Stage I posterior, Stage I apical prolapse       OBJECTIVE:    DIAGNOSTIC FINDINGS:   MD findings: Pelvic floor strength II/V, puborectalis IV/V external anal sphincter IV/V PVR of 1 ml was obtained by bladder scan   Pelvic floor musculature: Right levator tender, Right obturator tender, Left levator tender, Left obturator tender     COGNITION:            Overall cognitive status: Within functional limits for tasks assessed                          SENSATION:            Light touch: Appears intact            Proprioception: Appears intact                  POSTURE:  scoliosis               PELVIC ALIGNMENT:   LUMBARAROM/PROM Lumbar ROM is limited by 25%     LOWER EXTREMITY ROM:   Passive ROM Right eval Left eval Right  02/09/2022 Left 02/09/2022  Hip flexion 100 100 105 110  Hip external rotation 40 40 55 55   (Blank rows = not tested)   LOWER EXTREMITY MMT:   MMT Right eval Left eval Right 02/09/2022 Left  02/09/2022  Hip abduction 3+/5 3+/5 4/5 3+/5 with tailbone pain     PALPATION:   General  able to contract her lower abdomen correctly. Scar on the lower abdomen is tight and restricted. Restrictions around the coccyx where it was removed.                  External Perineal Exam intact, negative Q-tip test                             Internal Pelvic Floor tender on bilateral levator ani and obturator internist   Patient confirms identification and approves PT to assess internal pelvic floor and treatment Yes   PELVIC MMT:   MMT eval 01/12/2022 02/09/2022  Vaginal Ant. Post. 2/5 and lateral is 1/5,  no circular hug of the therapist finger 3/5 4/5          TONE: increased     TODAY'S TREATMENT  02/09/2022 Manual: Internal pelvic floor techniques:No emotional/communication barriers or cognitive limitation. Patient is motivated to learn. Patient understands and agrees with treatment goals and plan. PT explains patient will be examined in standing, sitting, and lying down to see how their muscles and joints work. When they are ready, they  will be asked to remove their underwear so PT can examine their perineum. The patient is also given the option of providing their own chaperone as one is not provided in our facility. The patient also has the right and is explained the right to defer or refuse any part of the evaluation or treatment including the internal exam. With the patient's consent, PT will use one gloved finger to gently assess the muscles of the pelvic floor, seeing how well it contracts and relaxes and if there is muscle symmetry. After, the patient will get dressed and PT and patient will discuss exam findings and plan of care. PT and patient discuss plan of care, schedule, attendance policy and HEP activities.  Going through the vagina working on the levator ani, along the urogenital diaphragm, bulbocavernosus and ischiocavernosus and pubovaginalis    01/26/2022 Manual: Soft tissue mobilization:To assess for dry needling, using the addaday to the lumbar paraspinals, quadratus, gluteals,  Trigger Point Dry-Needling  Treatment instructions: Expect mild to moderate muscle soreness. S/S of pneumothorax if dry needled over a lung field, and to seek immediate medical attention should they occur. Patient verbalized understanding of these instructions and education.   Patient Consent Given: Yes Education handout provided: Previously provided Muscles treated: lumbar multifidi, gluteus medius, gluteus maximus and minimus and along the border of the SI joint Electrical stimulation performed: No Parameters: N/A Treatment response/outcome: elongation of the muscles and trigger point response Placed moist heat on the back and gluteals for 10 min after dry needling Exercises: Strengthening:verbally reviewed patient HEP and going over on not contracting the gluteals. She feels she is doing well  with the exercises    01/12/2022 Manual: Internal pelvic floor techniques:No emotional/communication barriers or cognitive limitation.  Patient is motivated to learn. Patient understands and agrees with treatment goals and plan. PT explains patient will be examined in standing, sitting, and lying down to see how their muscles and joints work. When they are ready, they will be asked to remove their underwear so PT can examine their perineum. The patient is also given the option of providing their own chaperone as one is not provided in our facility. The patient also has the right and is explained the right to defer or refuse any part of the evaluation or treatment including the internal exam. With the patient's consent, PT will use one gloved finger to gently assess the muscles of the pelvic floor, seeing how well it contracts and relaxes and if there is muscle symmetry. After, the patient will get dressed and PT and patient will discuss exam findings and plan of care. PT and patient discuss plan of care, schedule, attendance policy and HEP activities.  Going through the vaginal working on the top of the bladder with one finger internally and other on the lower abdominal area to release the restrictions Manual work to bilateral iliococcygeus, ATLA, and puborectalis with movement of the hip  Manual work along the introitus  Neuromuscular re-education: Pelvic floor contraction training:therapist finger in the vaginal canal working on pelvic floor  contraction after manual work and having her feel the lower abdominal contract and lift.      PATIENT EDUCATION:  12/24/2021 Education details: Access Code: [email or web address], vaginal moisturizers and vaginal health, scar massage Person educated: Patient Education method: Explanation, Demonstration, Tactile cues, Verbal cues, and Handouts Education comprehension: verbalized understanding, returned demonstration, verbal cues required, tactile cues required, and needs further education     HOME EXERCISE PROGRAM: 12/24/2021 Access Code: [email or web address] URL: [email or web address] Date:  12/24/2021 Prepared by: Earlie Counts   Program Notes glycerin suppositories into the anusvitamin E into the vaginal canal 2 times per week   Exercises - Seated Diaphragmatic Breathing  - 3 x daily - 7 x weekly - 1 sets - 10 reps - Supine Single Knee to Chest Stretch  - 1 x daily - 7 x weekly - 1 sets - 2 reps - Supine Piriformis Stretch Pulling Heel to Hip  - 1 x daily - 7 x weekly - 1 sets - 2 reps - Supine Lower Trunk Rotation  - 1 x daily - 7 x weekly - 1 sets - 10 reps     ASSESSMENT:   CLINICAL IMPRESSION: Patient is a 66 y.o. female who was seen today for physical therapy treatment for ovreactive bladder and levator spasm..Patient has increased in bilateral hp ROM. She has increased in right hip abduction. Her urinary leakage is 50% better. She understands how to delay the urge to urinate.  Patient pelvic floor strength is 4/5. She is able to tolerate insertion of therapist finger without increased pain. Patient is independent with her HEP. She is having increased joint and back pain due to her arthritis. She will be getting an evaluation to address the areas in physical therapy. This will also help her pelvic floor as she gets stronger and increase her mobility.    OBJECTIVE IMPAIRMENTS decreased activity tolerance, decreased coordination, decreased endurance, decreased strength, increased fascial restrictions, increased muscle spasms, and impaired tone.    ACTIVITY LIMITATIONS lifting, bending, standing, transfers, continence, and toileting   PARTICIPATION LIMITATIONS: community activity   PERSONAL FACTORS 3+ comorbidities:    Vaginal hysterectomy with TVH, Posterior repair 01/10/2005.  Diabetes; Sjogren's; Appendectomy; Hernia repair; tailbone removedare also affecting patient's functional outcome.    REHAB POTENTIAL: Excellent   CLINICAL DECISION MAKING: Stable/uncomplicated   EVALUATION COMPLEXITY: Low     GOALS: Goals reviewed with patient? Yes   SHORT TERM GOALS: Target  date: 01/12/2022   Patient understands ways to moisturize the vaginal and anal canal to improve tissue health.  Baseline: Goal status: Met 12/24/2021   2.  Patient reports her urge to void improved >/= 25% using the behavioral technique.  Baseline:  Goal status: Met 01/07/2022   3.  Patient educated on correct toileting technique to empty her bowels better.  Baseline:  Goal status: Met 01/07/2022   LONG TERM GOALS: Target date: 03/09/2022    Patient independent with advanced HEP for pelvic floor strength and coordination exercises.  Baseline:  Goal status: Met 02/09/2022   2.  Patient reports her urinary leakage with urge to void decreased >/= 75% using the correct behavioral technique.  Baseline: improved by 50% Goal status: not met 02/09/2022   3.  Patient is able to go out of the house for 2 hours or more without  a pad due to improve strength of pelvic floor >/= 3/5 and good circular hug of therapist finger.  Baseline:  Goal status: Met 02/09/2022   4.  Patient is able to have  a vaginal exam with pain no more than 2/10 due to reduction of trigger points in the pelvic floor muscles and improved tissue health.  Baseline:  Goal status: Met 02/09/2022   5.  Improved mobility of the lower abdominal scar to put less pressure on the bladder to reduce her leakage.  Baseline:  Goal status: Met 01/26/2022   PLAN: PLAN FOR NEXT SESSION: Discharge to HEP. She will be going to physical therapy for her arthritic joints  Earlie Counts, PT 02/09/22 12:35 PM   PHYSICAL THERAPY DISCHARGE SUMMARY  Visits from Start of Care: 8  Current functional level related to goals / functional outcomes: See above.    Remaining deficits: See above.    Education / Equipment: HEP   Patient agrees to discharge. Patient goals were  all but one goal  . Patient is being discharged due to being pleased with the current functional level. Thank you for the referral. Earlie Counts, PT 02/09/22 12:35  PM

## 2022-02-11 ENCOUNTER — Ambulatory Visit: Payer: Medicare Other | Attending: Internal Medicine

## 2022-02-11 DIAGNOSIS — R7989 Other specified abnormal findings of blood chemistry: Secondary | ICD-10-CM | POA: Diagnosis not present

## 2022-02-11 DIAGNOSIS — R310 Gross hematuria: Secondary | ICD-10-CM

## 2022-02-12 LAB — HEPATIC FUNCTION PANEL
ALT: 24 IU/L (ref 0–32)
AST: 27 IU/L (ref 0–40)
Albumin: 4.3 g/dL (ref 3.9–4.9)
Alkaline Phosphatase: 114 IU/L (ref 44–121)
Bilirubin Total: 0.4 mg/dL (ref 0.0–1.2)
Bilirubin, Direct: 0.12 mg/dL (ref 0.00–0.40)
Total Protein: 6.9 g/dL (ref 6.0–8.5)

## 2022-02-18 ENCOUNTER — Ambulatory Visit (HOSPITAL_COMMUNITY)
Admission: RE | Admit: 2022-02-18 | Discharge: 2022-02-18 | Disposition: A | Discharge status: Home / Self Care | Payer: Medicare Other | Source: Ambulatory Visit | Attending: Obstetrics and Gynecology | Admitting: Obstetrics and Gynecology

## 2022-02-18 DIAGNOSIS — R310 Gross hematuria: Secondary | ICD-10-CM | POA: Diagnosis not present

## 2022-03-01 ENCOUNTER — Other Ambulatory Visit: Payer: Self-pay | Admitting: Obstetrics and Gynecology

## 2022-03-01 DIAGNOSIS — R310 Gross hematuria: Secondary | ICD-10-CM

## 2022-03-01 MED ORDER — PREDNISONE 50 MG PO TABS: ORAL_TABLET | ORAL | 0 refills | Status: DC

## 2022-03-01 NOTE — Addendum Note (Signed)
Addended by: Berton Mount on: 03/01/2022 04:25 PM   Modules accepted: Orders

## 2022-03-01 NOTE — Progress Notes (Signed)
Called patient and spoke to her on the phone regarding prednisone dosing. She is to take '50mg'$  PO at 13 hours before the CT scan, a '50mg'$  tablet PO 7 hours before scan, and '50mg'$  of Prednsione and '50mg'$  of benadryl 1 hour before scan.   Patient reports understanding and will call to reschedule her apt for CT scan.

## 2022-03-18 ENCOUNTER — Ambulatory Visit (HOSPITAL_COMMUNITY)
Admission: RE | Admit: 2022-03-18 | Discharge: 2022-03-18 | Disposition: A | Discharge status: Home / Self Care | Payer: Medicare Other | Source: Ambulatory Visit | Attending: Obstetrics and Gynecology | Admitting: Obstetrics and Gynecology

## 2022-03-18 DIAGNOSIS — E11649 Type 2 diabetes mellitus with hypoglycemia without coma: Secondary | ICD-10-CM | POA: Diagnosis not present

## 2022-03-18 DIAGNOSIS — R310 Gross hematuria: Secondary | ICD-10-CM | POA: Diagnosis not present

## 2022-03-18 MED ORDER — IOHEXOL 350 MG/ML SOLN
75.0000 mL | Freq: Once | INTRAVENOUS | Status: AC | PRN
  Administered 2022-03-18: 75 mL via INTRAVENOUS

## 2022-03-22 DIAGNOSIS — L700 Acne vulgaris: Secondary | ICD-10-CM | POA: Diagnosis not present

## 2022-03-22 DIAGNOSIS — L720 Epidermal cyst: Secondary | ICD-10-CM | POA: Diagnosis not present

## 2022-03-22 DIAGNOSIS — L810 Postinflammatory hyperpigmentation: Secondary | ICD-10-CM | POA: Diagnosis not present

## 2022-03-22 DIAGNOSIS — L905 Scar conditions and fibrosis of skin: Secondary | ICD-10-CM | POA: Diagnosis not present

## 2022-03-22 DIAGNOSIS — L910 Hypertrophic scar: Secondary | ICD-10-CM | POA: Diagnosis not present

## 2022-03-22 DIAGNOSIS — D2362 Other benign neoplasm of skin of left upper limb, including shoulder: Secondary | ICD-10-CM | POA: Diagnosis not present

## 2022-03-23 ENCOUNTER — Encounter (INDEPENDENT_AMBULATORY_CARE_PROVIDER_SITE_OTHER): Payer: Self-pay | Admitting: Family Medicine

## 2022-03-23 ENCOUNTER — Ambulatory Visit (INDEPENDENT_AMBULATORY_CARE_PROVIDER_SITE_OTHER): Payer: Medicare Other | Admitting: Family Medicine

## 2022-03-23 VITALS — BP 116/79 | HR 62 | Temp 98.1°F | Ht 66.0 in | Wt 221.0 lb

## 2022-03-23 DIAGNOSIS — I10 Essential (primary) hypertension: Secondary | ICD-10-CM

## 2022-03-23 DIAGNOSIS — Z6835 Body mass index (BMI) 35.0-35.9, adult: Secondary | ICD-10-CM

## 2022-03-23 DIAGNOSIS — Z0289 Encounter for other administrative examinations: Secondary | ICD-10-CM

## 2022-03-23 DIAGNOSIS — E669 Obesity, unspecified: Secondary | ICD-10-CM

## 2022-03-23 DIAGNOSIS — M1990 Unspecified osteoarthritis, unspecified site: Secondary | ICD-10-CM

## 2022-03-23 DIAGNOSIS — E1169 Type 2 diabetes mellitus with other specified complication: Secondary | ICD-10-CM | POA: Diagnosis not present

## 2022-03-27 IMAGING — US US THYROID
1 series · 12 of 25 positions shown · non-contrast
Comparison: 04/01/2017

CLINICAL DATA: Prior ultrasound follow-up.

EXAM:
THYROID ULTRASOUND
TECHNIQUE: Ultrasound examination of the thyroid gland and adjacent soft
tissues was performed.

[Series 1: us thyroid · 0.05mm/px · 12 of 72 slices shown]
[im 3/72]
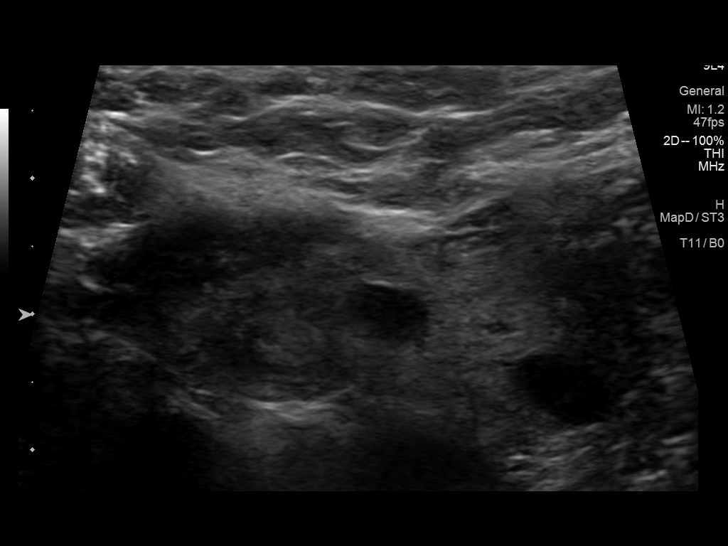
[im 9/72]
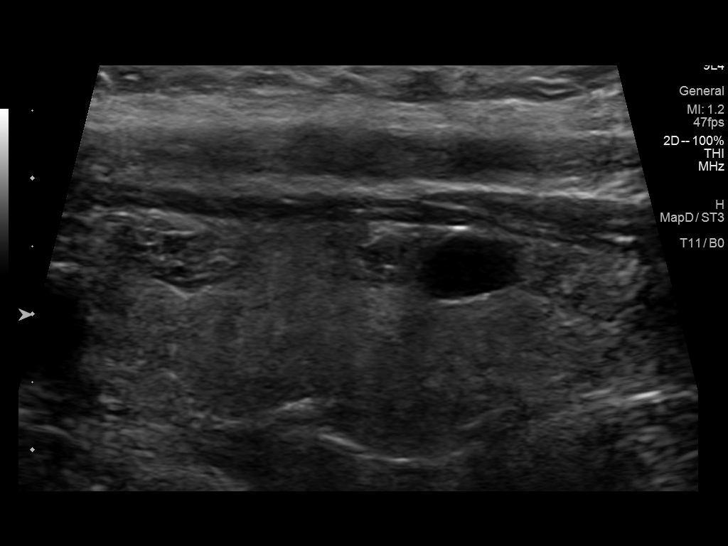
[im 15/72]
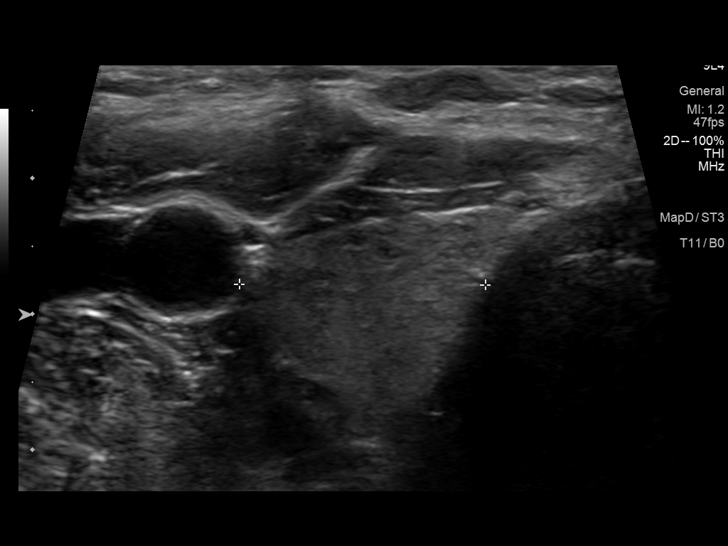
[im 21/72]
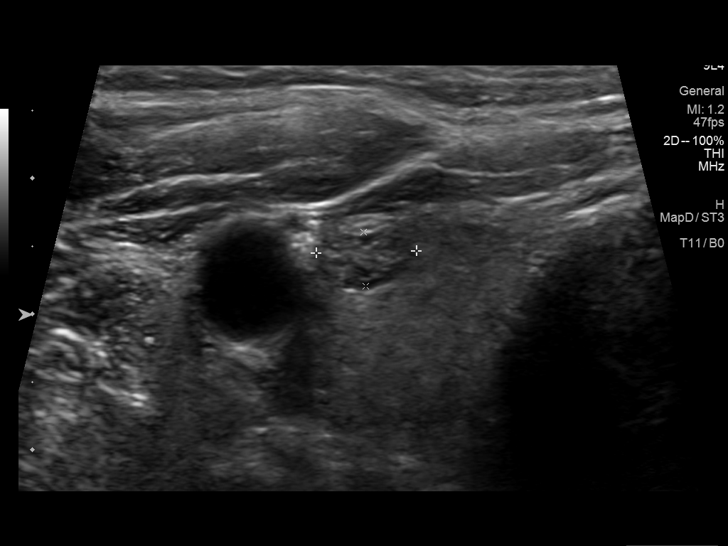
[im 27/72]
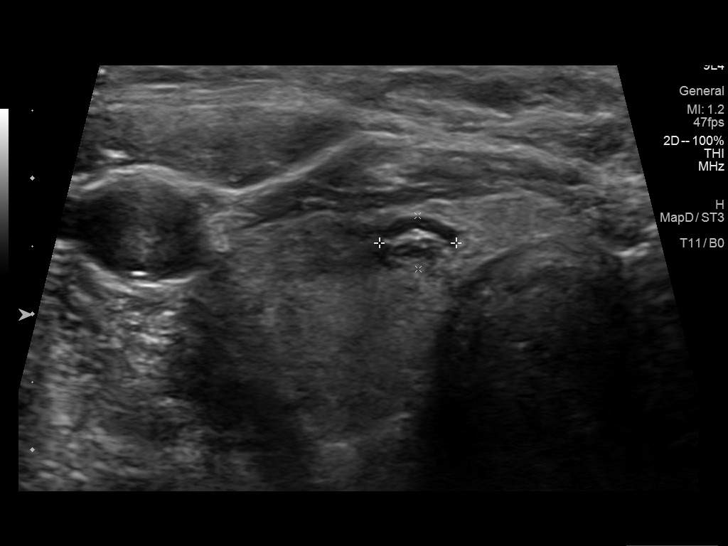
[im 33/72]
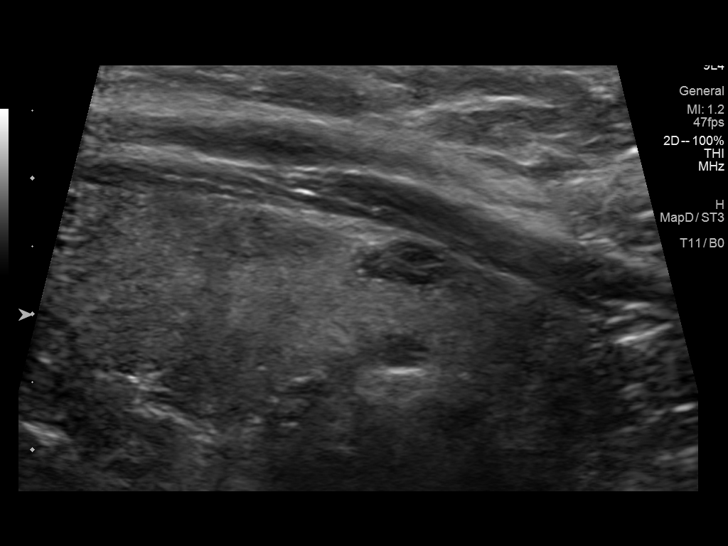
[im 39/72]
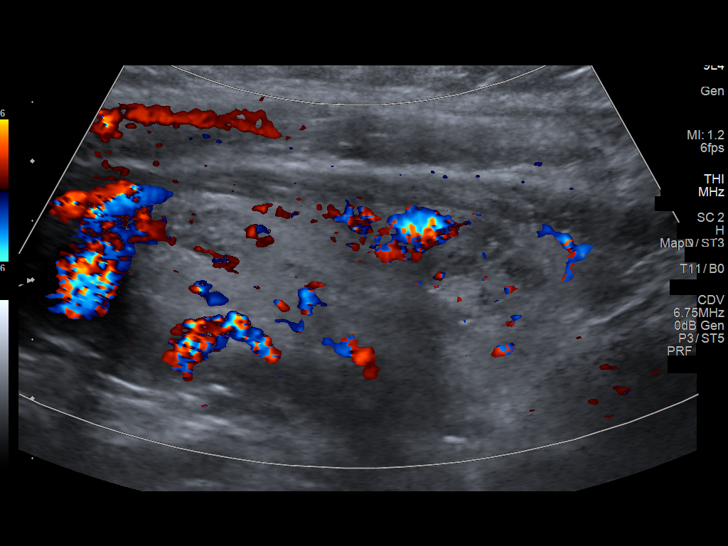
[im 45/72]
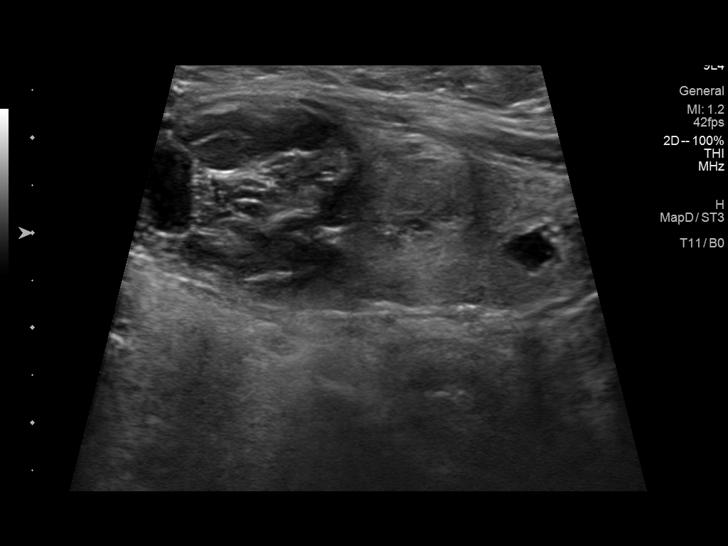
[im 51/72]
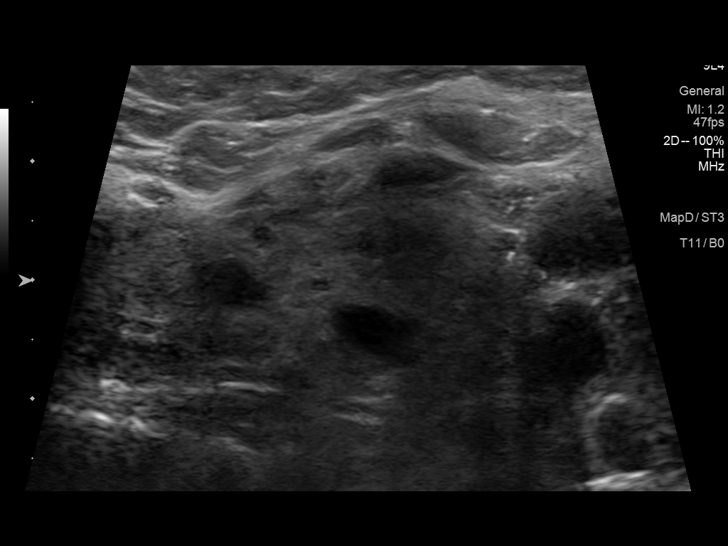
[im 57/72]
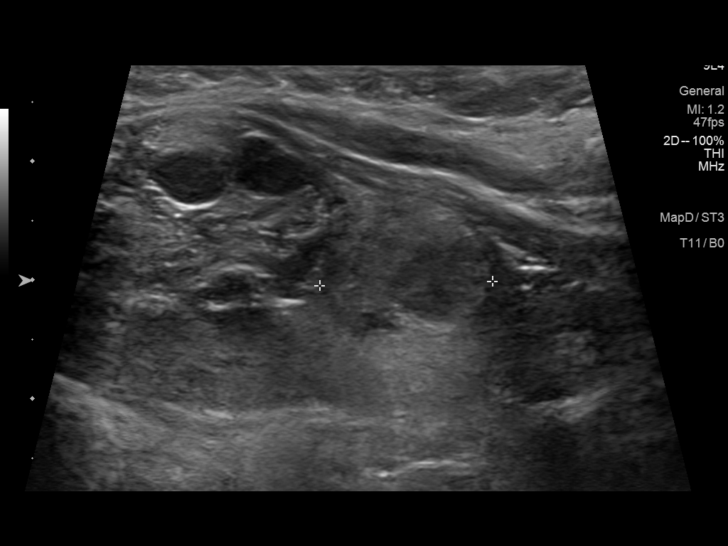
[im 63/72]
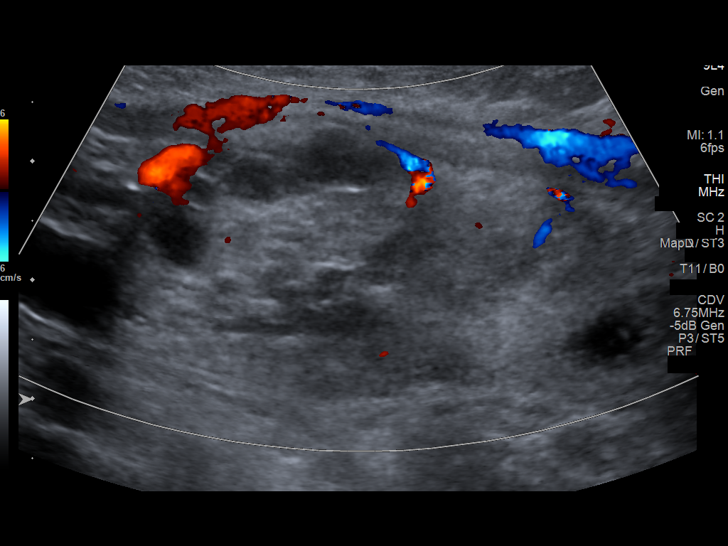
[im 69/72]
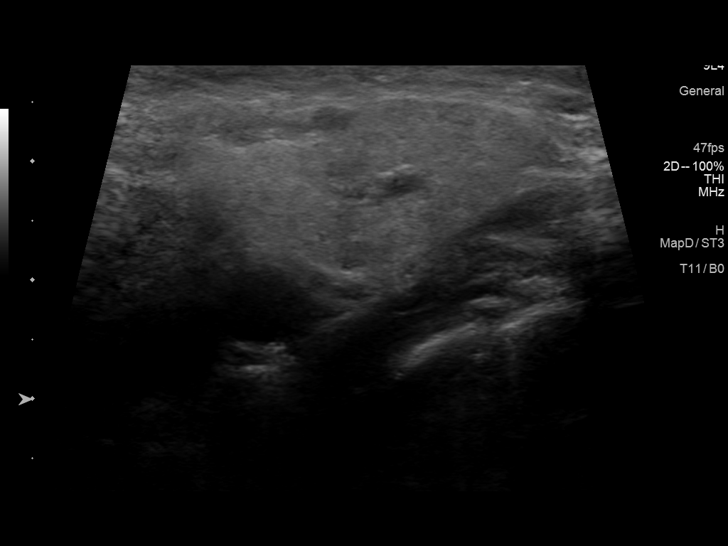

[12 of 25 positions shown; findings below may reference images not displayed]

FINDINGS: Parenchymal Echotexture: Mildly heterogenous

Isthmus: Normal in size measures 0.8 cm in diameter, unchanged

Right lobe: Normal in size measuring 4.6 x 1.6 x 1.8 cm, previously,
5.5 x 1.7 x 1.6 cm

Left lobe: Normal in size measuring 5.1 x 2.3 x 2.2 cm, previously,
5.1 x 1.8 x 2.1 cm

_________________________________________________________

Estimated total number of nodules >/= 1 cm: 2

Number of spongiform nodules >/=  2 cm not described below (TR1): 0

Number of mixed cystic and solid nodules >/= 1.5 cm not described
below (TR2): 0

_________________________________________________________

There is an approximately 0.8 cm spongiform/benign-appearing nodule
within the superior pole the right lobe of the thyroid (labeled 1),
not definitely seen on the 6785 examination though does not meet
criteria to recommend percutaneous sampling or continued dedicated
follow-up

There is an approximately 0.6 x 0.5 x 0.4 cm
spongiform/benign-appearing nodule within the mid aspect the right
lobe of the thyroid (labeled 2), not definitely seen on the 6785
examination though does not meet imaging criteria to recommend
percutaneous sampling or continued dedicated follow-up

There is an approximately 0.8 x 0.7 x 0.5 cm minimally complex cyst
within the mid aspect of the left lobe of the thyroid (labeled 3)
which is unchanged compared to the 6785 examination, previously,
x 0.6 x 0.4 cm, and again does not meet criteria to recommend
percutaneous sampling or continued dedicated follow-up

There is an approximately 0.7 x 0.7 x 0.4 cm
spongiform/benign-appearing nodule within the inferior pole the
right lobe of the thyroid (labeled 4), which is unchanged to
decreased in size compared to the 6785 examination, previously,
cm, and again does not meet criteria to recommend percutaneous
sampling or continued dedicated follow-up

_________________________________________________________

The approximately 2.7 x 2.2 x 1.8 cm spongiform/benign-appearing
nodule within the superior pole the left lobe of the thyroid
(labeled 5), has minimally increased in size compared to the 6785
examination, previously, 1.7 x 1.3 x 1.1 cm, however size
differences are attributable to interval partial cystic
degeneration, a typically benign finding, and as such this nodule
does not meet criteria to recommend percutaneous sampling or
continued dedicated follow-up.

_________________________________________________________

Nodule # 6:

Prior biopsy: No

Location: Left; Mid

Maximum size: 1.5 cm; Other 2 dimensions: 1.4 x 1.1 cm, previously,
1.5 x 1.4 x 0.9 cm

Composition: solid/almost completely solid (2)

Echogenicity: isoechoic (1)

Shape: not taller-than-wide (0)

Margins: ill-defined (0)

Echogenic foci: none (0)

ACR TI-RADS total points: 3.

ACR TI-RADS risk category:  TR3 (3 points).

Significant change in size (>/= 20% in two dimensions and minimal
increase of 2 mm): No

Change in features: No

Change in ACR TI-RADS risk category: No

ACR TI-RADS recommendations:

*Given size (>/= 1.5 - 2.4 cm) and appearance, a follow-up
ultrasound in 1 year should be considered based on TI-RADS criteria.

_________________________________________________________
IMPRESSION: 1. Findings suggestive of multinodular goiter. No definitive
worrisome new or enlarging thyroid nodules.
2. Nodule #6 is unchanged compared to the 6785 examination though
again meets imaging criteria to recommend annual/biannual follow-up.
This examination documents 29 months of stability. Follow-up
examination in [DATE] would ensure 5 years of stability and thus a
benign etiology.

The above is in keeping with the ACR TI-RADS recommendations - [HOSPITAL] 0123;[DATE].

## 2022-04-01 NOTE — Progress Notes (Signed)
Office: [phone]  /  Fax: [phone]   Initial Visit  [name] was seen in clinic today to evaluate for obesity. She is interested in losing weight to improve overall health and reduce the risk of weight related complications. She presents today to review program treatment options, initial physical assessment, and evaluation.     She was referred by: PCP  When asked what else they would like to accomplish? She states: Improve existing medical conditions, Improve quality of life, and Lose a target amount of weight : 180 lbs  When asked how has your weight affected you? She states: Contributed to medical problems, Contributed to orthopedic problems or mobility issues, and Having fatigue  Some associated conditions: Hypertension, Arthritis, Hyperlipidemia, Diabetes, and Other: kidney stones.  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  Weight promoting medications identified: Steroids  Current nutrition plan: Other: she is allergic to shellfish and lactose intolerant.   Current level of physical activity: Walking and Other: Elliptical, arthritis has been limiting her exercise.  Current or previous pharmacotherapy: GLP-1 and Other: Saxenda caused nausea and vomiting.   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 Visceral Fat Rating:14, Body Fat%:45.1  General:  Alert, oriented and cooperative. Patient is in no acute distress.  Respiratory:  Normal respiratory effort, no problems with respiration noted  Extremities: Normal range of motion.    Mental Status: Normal mood and affect. Normal behavior. Normal judgment and thought content.   Assessment and Plan:  1. Type 2 diabetes mellitus with other specified complication, unspecified whether long term insulin use ([place]) Managed by diet and exercise.  Has seen endocrinology at [place].  Has never used metformin.  Had GI side effects on liraglutide.  Begin reducing intake of added sugars.  2. Essential hypertension Blood pressure well-controlled on Bystolic 5 mg daily.  Patient denies chest pain today.  Patient would like to see blood pressure improvements with weight loss.  3. Arthritis Improved on Plaquenil.  Managed by rheumatology with a history of gout, Sojourn's, Raynaud's.  Plans to resume gym workouts.  Look for improvements in weightbearing joint pain with >10% TBW loss (22 lbs).  4. Obesity,current BMI 35.8 1.  Review bioimpedance. 2.  Review program expectations.  We reviewed weight, biometrics, associated medical conditions and contributing factors with patient. She would benefit from weight loss therapy via a modified calorie, low-carb, high-protein nutritional plan tailored to their REE (resting energy expenditure) which will be determined by indirect calorimetry.  We will also assess for cardiometabolic risk and nutritional derangements via fasting serologies at her next appointment.     Obesity Treatment / Action Plan:  Patient will work on garnering support from family and friends to begin weight loss journey. Will complete provided nutritional and psychosocial assessment questionnaire before the next appointment. Will be scheduled for indirect calorimetry to determine  resting energy expenditure in a fasting state.  This will allow [place] to create a reduced calorie, high-protein meal plan to promote loss of fat mass while preserving muscle mass. Will think about  ideas on how to incorporate physical activity into their daily routine. Was counseled on nutritional approaches to weight loss and benefits of complex carbs and high quality protein as part of nutritional weight management. Was counseled on pharmacotherapy and role as an adjunct in weight management.   Obesity Education Performed Today:  She was weighed on the bioimpedance scale and results were discussed and documented in the synopsis.  We discussed obesity as a disease and the importance of a more detailed evaluation of all the factors contributing to the disease.  We discussed the importance of long term lifestyle changes which include nutrition, exercise and behavioral modifications as well as the importance of customizing this to her specific health and social needs.  We discussed the benefits of reaching a healthier weight to alleviate the symptoms of existing conditions and reduce the risks of the biomechanical, metabolic and psychological effects of obesity.  ALEKSIS JIGGETTS appears to be in the action stage of change and states they are ready to start intensive lifestyle modifications and behavioral modifications.  30 minutes was spent today on this visit including the above counseling, pre-visit chart review, and post-visit documentation.  Reviewed by clinician on day of visit: allergies, medications, problem list, medical history, surgical history, family history, social history, and previous encounter notes.  I, Davy Pique, am acting as Location manager for Loyal Gambler, DO.  I have reviewed the above documentation for accuracy and completeness, and I agree with the above. Dell Ponto, DO

## 2022-04-12 HISTORY — PX: ANKLE SURGERY: SHX546

## 2022-04-13 ENCOUNTER — Telehealth: Payer: Self-pay | Admitting: Internal Medicine

## 2022-04-13 MED ORDER — FREESTYLE LIBRE 2 READER DEVI: 0 refills | Status: DC

## 2022-04-13 MED ORDER — FREESTYLE LIBRE 2 SENSOR MISC: 0 refills | Status: DC

## 2022-04-13 NOTE — Telephone Encounter (Signed)
Pt wants Dr Wynetta Emery to know this incorrect Freestyle Elenor Legato was sent in for her . Pt states she must have the Freestyle Libre 2. Pt states she needs a quantity of 3, and w/ refills. Current Rx is for only 2/ mo.  [place], [place]

## 2022-04-13 NOTE — Telephone Encounter (Signed)
Updated rx sent

## 2022-04-22 DIAGNOSIS — L720 Epidermal cyst: Secondary | ICD-10-CM | POA: Diagnosis not present

## 2022-04-27 ENCOUNTER — Encounter: Payer: Self-pay | Admitting: Internal Medicine

## 2022-04-27 ENCOUNTER — Ambulatory Visit: Payer: Medicare Other | Attending: Internal Medicine | Admitting: Internal Medicine

## 2022-04-27 VITALS — BP 107/71 | HR 73 | Temp 98.4°F | Ht 66.0 in | Wt 228.0 lb

## 2022-04-27 DIAGNOSIS — K219 Gastro-esophageal reflux disease without esophagitis: Secondary | ICD-10-CM

## 2022-04-27 DIAGNOSIS — I10 Essential (primary) hypertension: Secondary | ICD-10-CM | POA: Diagnosis not present

## 2022-04-27 DIAGNOSIS — E11649 Type 2 diabetes mellitus with hypoglycemia without coma: Secondary | ICD-10-CM

## 2022-04-27 DIAGNOSIS — M3500 Sicca syndrome, unspecified: Secondary | ICD-10-CM | POA: Diagnosis not present

## 2022-04-27 DIAGNOSIS — E1169 Type 2 diabetes mellitus with other specified complication: Secondary | ICD-10-CM | POA: Diagnosis not present

## 2022-04-27 DIAGNOSIS — Z23 Encounter for immunization: Secondary | ICD-10-CM

## 2022-04-27 DIAGNOSIS — Z8619 Personal history of other infectious and parasitic diseases: Secondary | ICD-10-CM

## 2022-04-27 DIAGNOSIS — R252 Cramp and spasm: Secondary | ICD-10-CM

## 2022-04-27 DIAGNOSIS — Z6836 Body mass index (BMI) 36.0-36.9, adult: Secondary | ICD-10-CM

## 2022-04-27 DIAGNOSIS — J4520 Mild intermittent asthma, uncomplicated: Secondary | ICD-10-CM | POA: Diagnosis not present

## 2022-04-27 DIAGNOSIS — E785 Hyperlipidemia, unspecified: Secondary | ICD-10-CM

## 2022-04-27 DIAGNOSIS — E559 Vitamin D deficiency, unspecified: Secondary | ICD-10-CM

## 2022-04-27 LAB — POCT GLYCOSYLATED HEMOGLOBIN (HGB A1C): HbA1c, POC (controlled diabetic range): 6.1 % (ref 0.0–7.0)

## 2022-04-27 LAB — GLUCOSE, POCT (MANUAL RESULT ENTRY): POC Glucose: 109 mg/dl — AB (ref 70–99)

## 2022-04-27 MED ORDER — MONTELUKAST SODIUM 10 MG PO TABS: 10.0000 mg | ORAL_TABLET | Freq: Every day | ORAL | 11 refills | Status: DC

## 2022-04-27 MED ORDER — CYCLOBENZAPRINE HCL 10 MG PO TABS: 10.0000 mg | ORAL_TABLET | Freq: Three times a day (TID) | ORAL | 3 refills | Status: DC | PRN

## 2022-04-27 MED ORDER — BENZONATATE 200 MG PO CAPS: 200.0000 mg | ORAL_CAPSULE | Freq: Three times a day (TID) | ORAL | 6 refills | Status: DC | PRN

## 2022-04-27 MED ORDER — ZOSTER VAC RECOMB ADJUVANTED 50 MCG/0.5ML IM SUSR: 0.5000 mL | Freq: Once | INTRAMUSCULAR | 0 refills | Status: AC

## 2022-04-27 MED ORDER — ATORVASTATIN CALCIUM 10 MG PO TABS: ORAL_TABLET | ORAL | 11 refills | Status: DC

## 2022-04-27 MED ORDER — ERGOCALCIFEROL 1.25 MG (50000 UT) PO CAPS: 50000.0000 [IU] | ORAL_CAPSULE | ORAL | 1 refills | Status: DC

## 2022-04-27 MED ORDER — DEXLANSOPRAZOLE 60 MG PO CPDR: 1.0000 | DELAYED_RELEASE_CAPSULE | Freq: Every morning | ORAL | 5 refills | Status: DC

## 2022-04-27 MED ORDER — VALACYCLOVIR HCL 500 MG PO TABS: 500.0000 mg | ORAL_TABLET | Freq: Every day | ORAL | 11 refills | Status: DC

## 2022-04-27 MED ORDER — FREESTYLE LANCETS MISC: 12 refills | Status: DC

## 2022-04-27 MED ORDER — FREESTYLE LITE TEST VI STRP: ORAL_STRIP | 12 refills | Status: DC

## 2022-04-27 NOTE — Patient Instructions (Signed)
I recommend testing your blood sugar manually whenever your continuous glucose monitor gives a low reading.  The goal is to keep the blood sugars greater than 80.  I will send a prescription to your pharmacy for test strips for your [place] 2 device.

## 2022-04-27 NOTE — Progress Notes (Addendum)
Patient ID: [name], female    DOB: September 13, 1955  MRN: [id number]  CC: Diabetes (DM f/u. Med refills. /Discuss endo & meds.)   Subjective: [name] is a 67 y.o. female who presents for chronic ds management Her concerns today include:  Patient with history of DM type II, HTN, HL, Sjogren syndrome, Raynaud's phenomenon, OA knees/ankles/shoulders/hips, HSV 2, asthma, gout, thyroid nodules followed by Dr. Harlow Asa, genital herpes, GERD, vaginal wall prolapse, OAB   DM: Results for orders placed or performed in visit on 04/27/22  POCT glycosylated hemoglobin (Hb A1C)  Result Value Ref Range   Hemoglobin A1C     HbA1c POC (<> result, manual entry)     HbA1c, POC (prediabetic range)     HbA1c, POC (controlled diabetic range) 6.1 0.0 - 7.0 %  POCT glucose (manual entry)  Result Value Ref Range   POC Glucose 109 (A) 70 - 99 mg/dl  Blood sugar today was 109.  Patient states it was in the 60s earlier. During the visit she checked her continuous glucose monitor and reading was at 64.  I had my CMA returned to the room to recheck blood sugar a minute later and blood sugar using manual device was 94. Patient has been diet control but having issues with hypoglycemia.  Saw endocrinology at [facility] for this since our last visit.  C-peptide and insulin levels normal.  Has f/u appt next mth -pt reports BS continue to drop more at nights.  BS take longer to rebound since being placed on Plaquenil 02/01/2022 by rheumatologist. Has started [facility] management.  Had recent intake visit. Feels Plaquenil works well for [employer] and other arthritis pains  HL: We started her on atorvastatin for cholesterol.  Decrease the frequency from every day to 3 times a week due to elevation in AST/ALT.  Repeat LFTs subsequently normalized.  HTN: Reports compliance with hydrochlorothiazide and nebivolol.  GERD: Needs refill on Dexilant.  Reports she is doing well on the medication and has pretty bad  reflux when she runs out of it.  Asthma: Overall doing well.  Last minor flare was in November.  She had to use her nebulizer treatments for 1 to 2 days.  Otherwise she does not have to use albuterol inhaler every day.  She uses [employer] as needed.  Would like refill on Flexeril which she uses as needed for muscle cramps.  She is to take 1 every night.  Vitamin D deficiency: Wants to know whether she needs to continue taking the high-dose vitamin D.  Patient Active Problem List   Diagnosis Date Noted   Type 2 diabetes mellitus with other specified complication ([place]) 97/35/3299   Arthritis 03/23/2022   Hyperlipidemia associated with type 2 diabetes mellitus ([place]) 12/24/2021   Essential hypertension 10/21/2021   Type 2 diabetes mellitus with hypoglycemia without coma, without long-term current use of insulin ([place]) 10/21/2021   Primary osteoarthritis involving multiple joints 10/21/2021   Mild intermittent asthma without complication 24/26/8341   History of herpes genitalis 10/21/2021   History of gout 10/21/2021   Gastroesophageal reflux disease without esophagitis 10/21/2021   Class 2 severe obesity with serious comorbidity and body mass index (BMI) of 36.0 to 36.9 in adult [facility]) 10/21/2021   Reflux    Asthma    Sjogren's syndrome ([place])      Current Outpatient Medications on File Prior to Visit  Medication Sig Dispense Refill   acetaminophen (TYLENOL) 650 MG CR tablet Take 650 mg  by mouth as needed.       albuterol (VENTOLIN HFA) 108 (90 Base) MCG/ACT inhaler Inhale 2 puffs into the lungs every 6 (six) hours as needed for wheezing or shortness of breath. 8 g 5   allopurinol (ZYLOPRIM) 100 MG tablet Take 1 tablet (100 mg total) by mouth daily. 30 tablet 6   ascorbic acid (VITAMIN C) 500 MG tablet Take 500 mg by mouth daily.     atorvastatin (LIPITOR) 10 MG tablet Take 1 tablet (10 mg total) by mouth daily. 30 tablet 5   b complex vitamins capsule Take 1 capsule by mouth daily. 1  chewable once daily.     BIOTIN PO Take by mouth.     Calcium 500-2.5 MG-MCG CHEW Chew 2 tablets by mouth daily.     Cetirizine HCl (ZYRTEC PO) Take 10 mg by mouth.      Cholecalciferol (VITAMIN D PO) Take by mouth. TAKES 2000      Cinnamon 500 MG TABS Take 2 tablets by mouth daily.     Continuous Blood Gluc Receiver (FREESTYLE LIBRE 2 READER) DEVI Use to check blood sugar three times daily. 1 each 0   Continuous Blood Gluc Sensor (FREESTYLE LIBRE 2 SENSOR) MISC Use to check blood sugar three times daily. Change sensors once every 14 days. 6 each 0   cyclobenzaprine (FLEXERIL) 10 MG tablet Take 1 tablet (10 mg total) by mouth 3 (three) times daily as needed. 30 tablet 3   cycloSPORINE (RESTASIS) 0.05 % ophthalmic emulsion 1 drop 2 (two) times daily.     dexlansoprazole (DEXILANT) 60 MG capsule Take 1 capsule (60 mg total) by mouth every morning. 30 capsule 5   diclofenac Sodium (VOLTAREN) 1 % GEL Apply 2 g topically daily. Once daily in the morning as needed.     EPINEPHrine 0.3 mg/0.3 mL IJ SOAJ injection Inject 0.3 mg into the muscle as needed for anaphylaxis. 1 each 1   ergocalciferol (VITAMIN D2) 1.25 MG (50000 UT) capsule Take 1 capsule (50,000 Units total) by mouth once a week. 12 capsule 1   Glucose 15 g PACK Take by mouth. Chew up to 4 tablets prn.     hydrochlorothiazide (HYDRODIURIL) 25 MG tablet Take 1 tablet (25 mg total) by mouth 2 (two) times daily. 180 tablet 2   hydroxychloroquine (PLAQUENIL) 200 MG tablet Take 2 tabs Orally daily for 90 days     ipratropium-albuterol (DUONEB) 0.5-2.5 (3) MG/3ML SOLN Take 3 mLs by nebulization every 6 (six) hours as needed. 100 mL 3   meclizine (ANTIVERT) 25 MG tablet Take 1 tablet (25 mg total) by mouth 2 (two) times daily as needed for dizziness. 30 tablet 1   meloxicam (MOBIC) 15 MG tablet Take 1 tablet (15 mg total) by mouth daily. 30 tablet 5   montelukast (SINGULAIR) 10 MG tablet Take 1 tablet (10 mg total) by mouth at bedtime. 30 tablet 6    Multiple Vitamins-Iron (MULTIVITAMIN/IRON PO) Take by mouth.       nebivolol (BYSTOLIC) 5 MG tablet Take 1 tablet (5 mg total) by mouth every morning. 90 tablet 3   polyethylene glycol (MIRALAX / GLYCOLAX) 17 g packet Take 17 g by mouth daily.     predniSONE (DELTASONE) 50 MG tablet Take 1 tablet 13 hours before. CT Take 1 tablet 7 hours before CT.  Take 1 tablet 1 hour before CT 3 tablet 0   traMADol (ULTRAM) 50 MG tablet Take 50 mg by mouth every 6 (six) hours as  needed.     triamcinolone (NASACORT) 55 MCG/ACT nasal inhaler Place 2 sprays into the nose as needed.       valACYclovir (VALTREX) 500 MG tablet Take 1 tablet (500 mg total) by mouth daily. 30 tablet 3   vitamin E 180 MG (400 UNITS) capsule Take 400 Units by mouth in the morning and at bedtime.     No current facility-administered medications on file prior to visit.    Allergies  Allergen Reactions   Codeine Swelling   Duratuss [Phenylephrine-Guaifenesin] Hives   Erythromycin Swelling    ALLERGIC TO MYCINS   Hydrocodone-Acetaminophen Swelling   Iodine Swelling   Latex Swelling   Oxycodone Swelling   Peanuts [Nuts] Swelling   Penicillins Swelling   Poultry Meal Swelling   Shellfish Allergy Swelling   Vicodin [Hydrocodone-Acetaminophen] Swelling    Social History   Socioeconomic History   Marital status: Single    Spouse name: Not on file   Number of children: Not on file   Years of education: Not on file   Highest education level: Not on file  Occupational History   Not on file  Tobacco Use   Smoking status: Former   Smokeless tobacco: Never  Vaping Use   Vaping Use: Never used  Substance and Sexual Activity   Alcohol use: Yes    Comment: Rare   Drug use: No   Sexual activity: Not Currently    Birth control/protection: Post-menopausal  Other Topics Concern   Not on file  Social History Narrative   Not on file   Social Determinants of Health   Financial Resource Strain: Not on file  Food  Insecurity: Not on file  Transportation Needs: Not on file  Physical Activity: Not on file  Stress: Not on file  Social Connections: Not on file  Intimate Partner Violence: Not on file    Family History  Problem Relation Age of Onset   Heart disease Mother    Arthritis Mother    Hyperlipidemia Mother    Heart disease Father    Hypertension Father    Cancer Father        Prostate and lung   Breast cancer Maternal Aunt        LATE 50'S   Ovarian cancer Maternal Aunt    Cancer Paternal Grandmother        COLON CA    Past Surgical History:  Procedure Laterality Date   APPENDECTOMY     BACK SURGERY  03/12/2010   L5   BREAST SURGERY     BIOPSY   HERNIA REPAIR     groin bilateral   NECK SURGERY     PELVIC LAPAROSCOPY  9024,0973   PILONIDAL CYST EXCISION     SALPINGOOPHORECTOMY  10/11/2002   SCOPE LSO-LEFT SEROUS CYSTADENOMA   SALPINGOOPHORECTOMY  04/12/2005   RSO/TOA   SHOULDER SURGERY Right 01/2018   TONSILLECTOMY AND ADENOIDECTOMY     TUBAL LIGATION     VAGINAL HYSTERECTOMY  01/10/2005   TVH, POSTERIOR REPAIR    ROS: Review of Systems Negative except as stated above  PHYSICAL EXAM: BP 107/71 (BP Location: Right Arm, Patient Position: Sitting, Cuff Size: Large)   Pulse 73   Temp 98.4 F (36.9 C) (Oral)   Ht '5\' 6"'$  (1.676 m)   Wt 228 lb (103.4 kg)   SpO2 99%   BMI 36.80 kg/m   Wt Readings from Last 3 Encounters:  04/27/22 228 lb (103.4 kg)  03/23/22 221 lb (100.2 kg)  01/29/22 231 lb (104.8 kg)    Physical Exam  General appearance - alert, well appearing, older African-American female and in no distress Mental status - normal mood, behavior, speech, dress, motor activity, and thought processes Chest - clear to auscultation, no wheezes, rales or rhonchi, symmetric air entry Heart - normal rate, regular rhythm, normal S1, S2, no murmurs, rubs, clicks or gallops Extremities - peripheral pulses normal, no pedal edema, no clubbing or cyanosis Diabetic Foot  Exam - Simple   Simple Foot Form Diabetic Foot exam was performed with the following findings: Yes 04/27/2022 11:00 AM  Visual Inspection See comments: Yes Sensation Testing Intact to touch and monofilament testing bilaterally: Yes Pulse Check Posterior Tibialis and Dorsalis pulse intact bilaterally: Yes Comments Patient is flat-footed right greater than left.  Small corns on the second through the fifth toes of the left foot.         Latest Ref Rng & Units 02/11/2022   10:43 AM 01/11/2022   10:48 AM 10/20/2021    3:25 PM  CMP  Glucose 70 - 99 mg/dL   86   BUN 8 - 27 mg/dL   24   Creatinine 0.57 - 1.00 mg/dL   1.10   Sodium 134 - 144 mmol/L   142   Potassium 3.5 - 5.2 mmol/L   3.7   Chloride 96 - 106 mmol/L   99   CO2 20 - 29 mmol/L   26   Calcium 8.7 - 10.3 mg/dL   9.4   Total Protein 6.0 - 8.5 g/dL 6.9  7.0  6.8   Total Bilirubin 0.0 - 1.2 mg/dL 0.4  0.3  0.2   Alkaline Phos 44 - 121 IU/L 114  126  111   AST 0 - 40 IU/L 27  47  21   ALT 0 - 32 IU/L 24  56  19    Lipid Panel     Component Value Date/Time   CHOL 156 01/11/2022 1048   TRIG 95 01/11/2022 1048   HDL 51 01/11/2022 1048   CHOLHDL 3.1 01/11/2022 1048   LDLCALC 87 01/11/2022 1048    CBC    Component Value Date/Time   WBC 5.9 10/20/2021 1525   WBC 6.1 03/18/2009 0555   RBC 5.20 10/20/2021 1525   RBC 4.43 03/18/2009 0555   HGB 13.7 10/20/2021 1525   HCT 42.2 10/20/2021 1525   PLT 212 10/20/2021 1525   MCV 81 10/20/2021 1525   MCH 26.3 (L) 10/20/2021 1525   MCHC 32.5 10/20/2021 1525   MCHC 33.2 03/18/2009 0555   RDW 13.6 10/20/2021 1525   LYMPHSABS 1.8 03/17/2009 1053   MONOABS 0.4 03/17/2009 1053   EOSABS 0.1 03/17/2009 1053   BASOSABS 0.0 03/17/2009 1053    ASSESSMENT AND PLAN:  1. Type 2 diabetes mellitus with hypoglycemia without coma, without long-term current use of insulin ([place]) At goal based on A1c. We got a 30 point difference between the reading that she was getting on her continuous  glucose monitor and our manual device.  When she got a reading of 64, repeat on our manual device was 94.  Advised patient to check blood sugars manually whenever she gets levels below 80 to see if her continuous glucose monitor is giving erroneous results.  It could also be that at nights when she lays on her side, the senor loses communication with the reader and gives a reading based on trajectory of the last several readings. I will send prescription to  her pharmacy for strips and lancets. - POCT glycosylated hemoglobin (Hb A1C) - POCT glucose (manual entry) - Microalbumin / creatinine urine ratio  2. Class 2 severe obesity due to excess calories with serious comorbidity and body mass index (BMI) of 36.0 to 36.9 in adult [facility]) Commended her for getting into the medical weight management program.  She will start her joining of healthy eating and trying to move is much as her joints will allow.  3. Hyperlipidemia associated with type 2 diabetes mellitus (HCC) - atorvastatin (LIPITOR) 10 MG tablet; 1 tab PO Q Mon/Wed/Frid  Dispense: 12 tablet; Refill: 11  4. Essential hypertension At goal on HCTZ and nebivolol.  5. Sjogren's syndrome without extraglandular involvement ([place]) Followed by rheumatology.  If indeed her blood sugars actually dropping low at night, advised that she speaks with her rheumatologist about perhaps changing the Plaquenil to another agent as Plaquenil can cause hypoglycemia.  6. Gastroesophageal reflux disease without esophagitis Stable on Dexilant - dexlansoprazole (DEXILANT) 60 MG capsule; Take 1 capsule (60 mg total) by mouth every morning.  Dispense: 30 capsule; Refill: 5  7. Mild intermittent asthma without complication Stable - benzonatate (TESSALON) 200 MG capsule; Take 1 capsule (200 mg total) by mouth 3 (three) times daily as needed for cough.  Dispense: 30 capsule; Refill: 6 - montelukast (SINGULAIR) 10 MG tablet; Take 1 tablet (10 mg total) by mouth at  bedtime.  Dispense: 30 tablet; Refill: 11  8. History of herpes genitalis Patient requested refill on her Valtrex - valACYclovir (VALTREX) 500 MG tablet; Take 1 tablet (500 mg total) by mouth daily.  Dispense: 30 tablet; Refill: 11  9. Need for shingles vaccine Prescription given today for her to take to her pharmacy to get first Shingrix shot - Zoster Vaccine Adjuvanted [facility]) injection; Inject 0.5 mLs into the muscle once for 1 dose.  Dispense: 0.5 mL; Refill: 0  10. Vitamin D deficiency - VITAMIN D 25 Hydroxy (Vit-D Deficiency, Fractures) - ergocalciferol (VITAMIN D2) 1.25 MG (50000 UT) capsule; Take 1 capsule (50,000 Units total) by mouth once a week.  Dispense: 12 capsule; Refill: 1  11. Muscle cramp - cyclobenzaprine (FLEXERIL) 10 MG tablet; Take 1 tablet (10 mg total) by mouth 3 (three) times daily as needed.  Dispense: 30 tablet; Refill: 3  Patient was given the opportunity to ask questions.  Patient verbalized understanding of the plan and was able to repeat key elements of the plan.   This documentation was completed using [employer].  Any transcriptional errors are unintentional.  Orders Placed This Encounter  Procedures   POCT glycosylated hemoglobin (Hb A1C)   POCT glucose (manual entry)     Requested Prescriptions    No prescriptions requested or ordered in this encounter    No follow-ups on file.  Karle Plumber, MD, FACP

## 2022-04-28 LAB — MICROALBUMIN / CREATININE URINE RATIO
Creatinine, Urine: 109.9 mg/dL
Microalb/Creat Ratio: 6 mg/g creat (ref 0–29)
Microalbumin, Urine: 7 ug/mL

## 2022-04-28 LAB — VITAMIN D 25 HYDROXY (VIT D DEFICIENCY, FRACTURES): Vit D, 25-Hydroxy: 76.4 ng/mL (ref 30.0–100.0)

## 2022-05-04 DIAGNOSIS — M3505 Sjogren syndrome with inflammatory arthritis: Secondary | ICD-10-CM | POA: Diagnosis not present

## 2022-05-04 DIAGNOSIS — M1991 Primary osteoarthritis, unspecified site: Secondary | ICD-10-CM | POA: Diagnosis not present

## 2022-05-04 DIAGNOSIS — M2560 Stiffness of unspecified joint, not elsewhere classified: Secondary | ICD-10-CM | POA: Diagnosis not present

## 2022-05-04 DIAGNOSIS — M2540 Effusion, unspecified joint: Secondary | ICD-10-CM | POA: Diagnosis not present

## 2022-05-04 DIAGNOSIS — M109 Gout, unspecified: Secondary | ICD-10-CM | POA: Diagnosis not present

## 2022-05-05 ENCOUNTER — Ambulatory Visit (INDEPENDENT_AMBULATORY_CARE_PROVIDER_SITE_OTHER): Payer: Medicare Other | Admitting: Family Medicine

## 2022-05-05 ENCOUNTER — Encounter (INDEPENDENT_AMBULATORY_CARE_PROVIDER_SITE_OTHER): Payer: Self-pay | Admitting: Family Medicine

## 2022-05-05 VITALS — BP 112/79 | HR 67 | Temp 97.7°F | Ht 66.0 in | Wt 222.0 lb

## 2022-05-05 DIAGNOSIS — Z6835 Body mass index (BMI) 35.0-35.9, adult: Secondary | ICD-10-CM

## 2022-05-05 DIAGNOSIS — M25561 Pain in right knee: Secondary | ICD-10-CM | POA: Diagnosis not present

## 2022-05-05 DIAGNOSIS — M6281 Muscle weakness (generalized): Secondary | ICD-10-CM | POA: Diagnosis not present

## 2022-05-05 DIAGNOSIS — M79671 Pain in right foot: Secondary | ICD-10-CM

## 2022-05-05 DIAGNOSIS — E1169 Type 2 diabetes mellitus with other specified complication: Secondary | ICD-10-CM | POA: Diagnosis not present

## 2022-05-05 DIAGNOSIS — R0602 Shortness of breath: Secondary | ICD-10-CM

## 2022-05-05 DIAGNOSIS — I10 Essential (primary) hypertension: Secondary | ICD-10-CM | POA: Diagnosis not present

## 2022-05-05 DIAGNOSIS — Z1331 Encounter for screening for depression: Secondary | ICD-10-CM | POA: Diagnosis not present

## 2022-05-05 DIAGNOSIS — M25571 Pain in right ankle and joints of right foot: Secondary | ICD-10-CM | POA: Diagnosis not present

## 2022-05-05 DIAGNOSIS — M25551 Pain in right hip: Secondary | ICD-10-CM | POA: Diagnosis not present

## 2022-05-05 DIAGNOSIS — R5383 Other fatigue: Secondary | ICD-10-CM | POA: Diagnosis not present

## 2022-05-05 DIAGNOSIS — M25552 Pain in left hip: Secondary | ICD-10-CM | POA: Diagnosis not present

## 2022-05-05 DIAGNOSIS — E669 Obesity, unspecified: Secondary | ICD-10-CM | POA: Diagnosis not present

## 2022-05-05 DIAGNOSIS — M25562 Pain in left knee: Secondary | ICD-10-CM | POA: Diagnosis not present

## 2022-05-05 DIAGNOSIS — M25512 Pain in left shoulder: Secondary | ICD-10-CM | POA: Diagnosis not present

## 2022-05-06 ENCOUNTER — Ambulatory Visit (INDEPENDENT_AMBULATORY_CARE_PROVIDER_SITE_OTHER): Payer: Medicare Other | Admitting: Family Medicine

## 2022-05-07 LAB — CBC WITH DIFFERENTIAL/PLATELET
Basophils Absolute: 0 10*3/uL (ref 0.0–0.2)
Basos: 1 %
EOS (ABSOLUTE): 0.1 10*3/uL (ref 0.0–0.4)
Eos: 2 %
Hematocrit: 41.7 % (ref 34.0–46.6)
Hemoglobin: 13 g/dL (ref 11.1–15.9)
Immature Grans (Abs): 0 10*3/uL (ref 0.0–0.1)
Immature Granulocytes: 0 %
Lymphocytes Absolute: 1.4 10*3/uL (ref 0.7–3.1)
Lymphs: 38 %
MCH: 25.4 pg — ABNORMAL LOW (ref 26.6–33.0)
MCHC: 31.2 g/dL — ABNORMAL LOW (ref 31.5–35.7)
MCV: 81 fL (ref 79–97)
Monocytes Absolute: 0.4 10*3/uL (ref 0.1–0.9)
Monocytes: 12 %
Neutrophils Absolute: 1.7 10*3/uL (ref 1.4–7.0)
Neutrophils: 47 %
Platelets: 188 10*3/uL (ref 150–450)
RBC: 5.12 x10E6/uL (ref 3.77–5.28)
RDW: 13.2 % (ref 11.7–15.4)
WBC: 3.6 10*3/uL (ref 3.4–10.8)

## 2022-05-07 LAB — COMPREHENSIVE METABOLIC PANEL
ALT: 29 IU/L (ref 0–32)
AST: 24 IU/L (ref 0–40)
Albumin/Globulin Ratio: 1.7 (ref 1.2–2.2)
Albumin: 4.3 g/dL (ref 3.9–4.9)
Alkaline Phosphatase: 115 IU/L (ref 44–121)
BUN/Creatinine Ratio: 27 (ref 12–28)
BUN: 24 mg/dL (ref 8–27)
Bilirubin Total: 0.3 mg/dL (ref 0.0–1.2)
CO2: 27 mmol/L (ref 20–29)
Calcium: 9.2 mg/dL (ref 8.7–10.3)
Chloride: 101 mmol/L (ref 96–106)
Creatinine, Ser: 0.89 mg/dL (ref 0.57–1.00)
Globulin, Total: 2.5 g/dL (ref 1.5–4.5)
Glucose: 90 mg/dL (ref 70–99)
Potassium: 4.1 mmol/L (ref 3.5–5.2)
Sodium: 142 mmol/L (ref 134–144)
Total Protein: 6.8 g/dL (ref 6.0–8.5)
eGFR: 72 mL/min/{1.73_m2} (ref >59)

## 2022-05-07 LAB — T4, FREE: Free T4: 1.35 ng/dL (ref 0.82–1.77)

## 2022-05-07 LAB — VITAMIN B12: Vitamin B-12: 799 pg/mL (ref 232–1245)

## 2022-05-07 LAB — INSULIN, RANDOM: INSULIN: 34.1 u[IU]/mL — ABNORMAL HIGH (ref 2.6–24.9)

## 2022-05-07 LAB — TSH: TSH: 4.15 u[IU]/mL (ref 0.450–4.500)

## 2022-05-07 LAB — FOLATE: Folate: 18.8 ng/mL (ref >3.0)

## 2022-05-10 NOTE — Progress Notes (Signed)
Precertification check for Cystoscopy Insurance: [employer] contacted through the provider portal CPT code: 52000 Dx code: R31.0- Dysuria (Yes No)  Prior auth needed The prior authorization/notification reference number is: [id number].

## 2022-05-12 DIAGNOSIS — M25552 Pain in left hip: Secondary | ICD-10-CM | POA: Diagnosis not present

## 2022-05-12 DIAGNOSIS — M25561 Pain in right knee: Secondary | ICD-10-CM | POA: Diagnosis not present

## 2022-05-12 DIAGNOSIS — M25571 Pain in right ankle and joints of right foot: Secondary | ICD-10-CM | POA: Diagnosis not present

## 2022-05-12 DIAGNOSIS — M25551 Pain in right hip: Secondary | ICD-10-CM | POA: Diagnosis not present

## 2022-05-12 DIAGNOSIS — M25562 Pain in left knee: Secondary | ICD-10-CM | POA: Diagnosis not present

## 2022-05-12 DIAGNOSIS — M25512 Pain in left shoulder: Secondary | ICD-10-CM | POA: Diagnosis not present

## 2022-05-12 DIAGNOSIS — M6281 Muscle weakness (generalized): Secondary | ICD-10-CM | POA: Diagnosis not present

## 2022-05-14 ENCOUNTER — Other Ambulatory Visit: Payer: Self-pay | Admitting: Surgery

## 2022-05-14 DIAGNOSIS — M25551 Pain in right hip: Secondary | ICD-10-CM | POA: Diagnosis not present

## 2022-05-14 DIAGNOSIS — M25561 Pain in right knee: Secondary | ICD-10-CM | POA: Diagnosis not present

## 2022-05-14 DIAGNOSIS — E041 Nontoxic single thyroid nodule: Secondary | ICD-10-CM

## 2022-05-14 DIAGNOSIS — M25571 Pain in right ankle and joints of right foot: Secondary | ICD-10-CM | POA: Diagnosis not present

## 2022-05-14 DIAGNOSIS — M25552 Pain in left hip: Secondary | ICD-10-CM | POA: Diagnosis not present

## 2022-05-14 DIAGNOSIS — M25512 Pain in left shoulder: Secondary | ICD-10-CM | POA: Diagnosis not present

## 2022-05-14 DIAGNOSIS — M25562 Pain in left knee: Secondary | ICD-10-CM | POA: Diagnosis not present

## 2022-05-14 DIAGNOSIS — M6281 Muscle weakness (generalized): Secondary | ICD-10-CM | POA: Diagnosis not present

## 2022-05-17 DIAGNOSIS — M25562 Pain in left knee: Secondary | ICD-10-CM | POA: Diagnosis not present

## 2022-05-17 DIAGNOSIS — M25552 Pain in left hip: Secondary | ICD-10-CM | POA: Diagnosis not present

## 2022-05-17 DIAGNOSIS — M25571 Pain in right ankle and joints of right foot: Secondary | ICD-10-CM | POA: Diagnosis not present

## 2022-05-17 DIAGNOSIS — M6281 Muscle weakness (generalized): Secondary | ICD-10-CM | POA: Diagnosis not present

## 2022-05-17 DIAGNOSIS — R7303 Prediabetes: Secondary | ICD-10-CM | POA: Diagnosis not present

## 2022-05-17 DIAGNOSIS — Z833 Family history of diabetes mellitus: Secondary | ICD-10-CM | POA: Diagnosis not present

## 2022-05-17 DIAGNOSIS — M25561 Pain in right knee: Secondary | ICD-10-CM | POA: Diagnosis not present

## 2022-05-17 DIAGNOSIS — M25512 Pain in left shoulder: Secondary | ICD-10-CM | POA: Diagnosis not present

## 2022-05-17 DIAGNOSIS — M25551 Pain in right hip: Secondary | ICD-10-CM | POA: Diagnosis not present

## 2022-05-18 ENCOUNTER — Encounter (INDEPENDENT_AMBULATORY_CARE_PROVIDER_SITE_OTHER): Payer: Self-pay | Admitting: Family Medicine

## 2022-05-19 ENCOUNTER — Encounter (INDEPENDENT_AMBULATORY_CARE_PROVIDER_SITE_OTHER): Payer: Self-pay | Admitting: Family Medicine

## 2022-05-19 ENCOUNTER — Ambulatory Visit (INDEPENDENT_AMBULATORY_CARE_PROVIDER_SITE_OTHER): Payer: Medicare Other | Admitting: Family Medicine

## 2022-05-19 VITALS — BP 114/74 | HR 65 | Temp 97.8°F | Ht 66.0 in | Wt 216.0 lb

## 2022-05-19 DIAGNOSIS — E88819 Insulin resistance, unspecified: Secondary | ICD-10-CM | POA: Diagnosis not present

## 2022-05-19 DIAGNOSIS — E66812 Obesity, class 2: Secondary | ICD-10-CM

## 2022-05-19 DIAGNOSIS — R718 Other abnormality of red blood cells: Secondary | ICD-10-CM | POA: Diagnosis not present

## 2022-05-19 DIAGNOSIS — E1169 Type 2 diabetes mellitus with other specified complication: Secondary | ICD-10-CM | POA: Diagnosis not present

## 2022-05-19 DIAGNOSIS — Z6835 Body mass index (BMI) 35.0-35.9, adult: Secondary | ICD-10-CM

## 2022-05-22 NOTE — Progress Notes (Signed)
Chief Complaint:   [name] (MR# [id number]) is a 67 y.o. female who presents for evaluation and treatment of Catherine and related comorbidities. Current BMI is Body mass index is 35.83 kg/m. [name] has been struggling with her weight for many years and has been unsuccessful in either losing weight, maintaining weight loss, or reaching her healthy weight goal.  [name] is currently in the action stage of change and ready to dedicate time achieving and maintaining a healthier weight. [name] is interested in becoming our patient and working on intensive lifestyle modifications including (but not limited to) diet and exercise for weight loss.  [name] lives alone and is a retired Education officer, [employer].  She recently quit smoking. She consumes a lot of carbs/sweets to "prevent" low sugars. Pt gained weight following divorce in 2001.  She drinks sugary sweet beverages daily and tends to over snack. Pt is a stress/boredom eater.  [name]'s habits were reviewed today and are as follows: her desired weight loss is 42-47 lbs, she started gaining weight after her divorce in 2001, her heaviest weight ever was 235 pounds, she snacks frequently in the evenings, she wakes up frequently in the middle of the night to eat, she skips meals frequently, she is frequently drinking liquids with calories, and she struggles with emotional eating.  Depression Screen [name]'s Food and Mood (modified PHQ-9) score was 5.     04/27/2022   10:23 AM  Depression screen PHQ 2/9  Decreased Interest 0  Down, Depressed, Hopeless 0  PHQ - 2 Score 0  Altered sleeping 2  Tired, decreased energy 3  Change in appetite 0  Feeling bad or failure about yourself  0  Trouble concentrating 0  Moving slowly or fidgety/restless 0  Suicidal thoughts 0  PHQ-9 Score 5   Subjective:   1. Other fatigue [name] admits to daytime somnolence and admits to waking up still tired. Patient has a history of symptoms of daytime  fatigue and morning fatigue. [name] generally gets 4 or 5 hours of sleep per night, and states that she has poor sleep quality. Snoring is present. Apneic episodes are not present. Epworth Sleepiness Score is 5.  EKG today- normal sinus rhythm without ischemia. Expected BMR 1702/Actual BMR 1526= less than expected  2. SOBOE (shortness of breath on exertion) [name] notes increasing shortness of breath with exercising and seems to be worsening over time with weight gain. She notes getting out of breath sooner with activity than she used to. This has gotten worse recently. [name] denies shortness of breath at rest or orthopnea.  3. Essential hypertension BP well controlled. Pt is taking HCTZ 25 mg daily and Bystolic 5 mg daily. Family history of hypertension. Pt denies adverse side effects.  4. Type 2 diabetes mellitus with other specified complication, unspecified whether long term insulin use (HCC) A1c 6.1 on 04/27/2022. Pt's morning fasting glucose runs 90-100's. She drops into the 60's at night. Pt's highest A1c was 6 months ago at 6.5. She is not taking any medications for diabetes. Has a CMG.  5. Right foot pain Right foot pain is limiting her walking. Pt is thinking about rejoining water aerobics. She is using insoles with sneakers and planning to start physical therapy.  Assessment/Plan:   1. Other fatigue [name] does feel that her weight is causing her energy to be lower than it should be. Fatigue may be related to Catherine, depression or many other causes. Labs will be ordered, and in the  meanwhile, [name] will focus on self care including making healthy food choices, increasing physical activity and focusing on stress reduction.  Lab/Orders today: - EKG 12-Lead - TSH - T4, free - Insulin, random - Folate - Comprehensive metabolic panel - Vitamin [id number] - CBC with Differential/Platelet  2. SOBOE (shortness of breath on exertion) [name] does feel that she gets out of breath more  easily that she used to when she exercises. [name]'s shortness of breath appears to be Catherine related and exercise induced. She has agreed to work on weight loss and gradually increase exercise to treat her exercise induced shortness of breath. Will continue to monitor closely.  Lab/Orders today: - CBC with Differential/Platelet  3. Essential hypertension Look for BP improvements with healthy lifestyle changes.  4. Type 2 diabetes mellitus with other specified complication, unspecified whether long term insulin use ([place]) Look for improvements with both, A1c and hypoglycemia, with dietary changes.  Needs reduction in high sugar items, getting complex carbs and protein with meals and snacks.  Lab/Orders today: - Insulin, random - Comprehensive metabolic panel  5. Right foot pain Follow up with rheumatology. Pt may need a podiatrist visit.  6. Depression screen [name] had a positive depression screening. Depression is commonly associated with Catherine and often results in emotional eating behaviors. We will monitor this closely and work on CBT to help improve the non-hunger eating patterns. Referral to Psychology may be required if no improvement is seen as she continues in our clinic.  7. Catherine,current BMI 35.8 100 calorie snack handout provided. Restart water exercise 2-3 times a week.  Lab/Orders today: - Insulin, random - Comprehensive metabolic panel  [name] is currently in the action stage of change and her goal is to continue with weight loss efforts. I recommend [name] begin the structured treatment plan as follows:  She has agreed to the Category 1 Plan.  Exercise goals: All adults should avoid inactivity. Some physical activity is better than none, and adults who participate in any amount of physical activity gain some health benefits.   Behavioral modification strategies: increasing lean protein intake, increasing vegetables, increasing water intake, decreasing liquid  calories, increasing high fiber foods, no skipping meals, keeping healthy foods in the home, better snacking choices, planning for success, and decreasing junk food.  She was informed of the importance of frequent follow-up visits to maximize her success with intensive lifestyle modifications for her multiple health conditions. She was informed we would discuss her lab results at her next visit unless there is a critical issue that needs to be addressed sooner. [name] agreed to keep her next visit at the agreed upon time to discuss these results.  Objective:   Blood pressure 112/79, pulse 67, temperature 97.7 F (36.5 C), height 5' 6"$  (1.676 m), weight 222 lb (100.7 kg), SpO2 98 %. Body mass index is 35.83 kg/m.  EKG: Normal sinus rhythm, rate 68.  Indirect Calorimeter completed today shows a VO2 of 222 and a REE of 1526.  Her calculated basal metabolic rate is 0000000 thus her basal metabolic rate is worse than expected.  General: Cooperative, alert, well developed, in no acute distress. HEENT: Conjunctivae and lids unremarkable. Cardiovascular: Regular rhythm.  Lungs: Normal work of breathing. Neurologic: No focal deficits.   Lab Results  Component Value Date   CREATININE 0.89 05/05/2022   BUN 24 05/05/2022   NA 142 05/05/2022   K 4.1 05/05/2022   CL 101 05/05/2022   CO2 27 05/05/2022   Lab Results  Component  Value Date   ALT 29 05/05/2022   AST 24 05/05/2022   ALKPHOS 115 05/05/2022   BILITOT 0.3 05/05/2022   Lab Results  Component Value Date   HGBA1C 6.1 04/27/2022   HGBA1C 6.5 10/20/2021   Lab Results  Component Value Date   INSULIN 34.1 (H) 05/05/2022   Lab Results  Component Value Date   TSH 4.150 05/05/2022   Lab Results  Component Value Date   CHOL 156 01/11/2022   HDL 51 01/11/2022   LDLCALC 87 01/11/2022   TRIG 95 01/11/2022   CHOLHDL 3.1 01/11/2022   Lab Results  Component Value Date   WBC 3.6 05/05/2022   HGB 13.0 05/05/2022   HCT 41.7 05/05/2022    MCV 81 05/05/2022   PLT 188 05/05/2022   Attestation Statements:   Reviewed by clinician on day of visit: allergies, medications, problem list, medical history, surgical history, family history, social history, and previous encounter notes.  Time spent on visit including pre-visit chart review and post-visit charting and care was 45 minutes.   I, Kathlene November, BS, CMA, am acting as transcriptionist for Loyal Gambler, DO.   I have reviewed the above documentation for accuracy and completeness, and I agree with the above. Dell Ponto, DO

## 2022-05-25 DIAGNOSIS — M25562 Pain in left knee: Secondary | ICD-10-CM | POA: Diagnosis not present

## 2022-05-25 DIAGNOSIS — M25571 Pain in right ankle and joints of right foot: Secondary | ICD-10-CM | POA: Diagnosis not present

## 2022-05-25 DIAGNOSIS — M25512 Pain in left shoulder: Secondary | ICD-10-CM | POA: Diagnosis not present

## 2022-05-25 DIAGNOSIS — M25561 Pain in right knee: Secondary | ICD-10-CM | POA: Diagnosis not present

## 2022-05-25 DIAGNOSIS — M6281 Muscle weakness (generalized): Secondary | ICD-10-CM | POA: Diagnosis not present

## 2022-05-25 DIAGNOSIS — M25552 Pain in left hip: Secondary | ICD-10-CM | POA: Diagnosis not present

## 2022-05-25 DIAGNOSIS — M25551 Pain in right hip: Secondary | ICD-10-CM | POA: Diagnosis not present

## 2022-05-26 DIAGNOSIS — M25552 Pain in left hip: Secondary | ICD-10-CM | POA: Diagnosis not present

## 2022-05-26 DIAGNOSIS — M25561 Pain in right knee: Secondary | ICD-10-CM | POA: Diagnosis not present

## 2022-05-26 DIAGNOSIS — M25512 Pain in left shoulder: Secondary | ICD-10-CM | POA: Diagnosis not present

## 2022-05-26 DIAGNOSIS — M25551 Pain in right hip: Secondary | ICD-10-CM | POA: Diagnosis not present

## 2022-05-26 DIAGNOSIS — M25562 Pain in left knee: Secondary | ICD-10-CM | POA: Diagnosis not present

## 2022-05-26 DIAGNOSIS — M25571 Pain in right ankle and joints of right foot: Secondary | ICD-10-CM | POA: Diagnosis not present

## 2022-05-26 DIAGNOSIS — M6281 Muscle weakness (generalized): Secondary | ICD-10-CM | POA: Diagnosis not present

## 2022-05-31 ENCOUNTER — Encounter: Payer: Self-pay | Admitting: *Deleted

## 2022-05-31 DIAGNOSIS — M25512 Pain in left shoulder: Secondary | ICD-10-CM | POA: Diagnosis not present

## 2022-05-31 DIAGNOSIS — M25561 Pain in right knee: Secondary | ICD-10-CM | POA: Diagnosis not present

## 2022-05-31 DIAGNOSIS — M6281 Muscle weakness (generalized): Secondary | ICD-10-CM | POA: Diagnosis not present

## 2022-05-31 DIAGNOSIS — M25562 Pain in left knee: Secondary | ICD-10-CM | POA: Diagnosis not present

## 2022-05-31 DIAGNOSIS — M25552 Pain in left hip: Secondary | ICD-10-CM | POA: Diagnosis not present

## 2022-05-31 DIAGNOSIS — M25571 Pain in right ankle and joints of right foot: Secondary | ICD-10-CM | POA: Diagnosis not present

## 2022-05-31 DIAGNOSIS — M25551 Pain in right hip: Secondary | ICD-10-CM | POA: Diagnosis not present

## 2022-06-01 ENCOUNTER — Ambulatory Visit
Admission: RE | Admit: 2022-06-01 | Discharge: 2022-06-01 | Disposition: A | Discharge status: Home / Self Care | Payer: Medicare Other | Source: Ambulatory Visit | Attending: Surgery | Admitting: Surgery

## 2022-06-01 DIAGNOSIS — E041 Nontoxic single thyroid nodule: Secondary | ICD-10-CM

## 2022-06-01 NOTE — Progress Notes (Signed)
Chief Complaint:   OBESITY [name] is here to discuss her progress with her obesity treatment plan along with follow-up of her obesity related diagnoses. [name] is on the Category 1 Plan +100 snack calorie and states she is following her eating plan approximately 100% of the time. [name] states she is PT 60 minutes 3 times per week.  Today's visit was #: 2 Starting weight: 5 LBS Starting date: 05/05/2022 Today's weight: 216 LBS Today's date: 05/19/2022 Total lbs lost to date: 6 LBS Total lbs lost since last in-office visit: 6 LBS  Interim History: Patient is liking foods on meal plan.  Eating a  60-calorie bread.  Patient denies a morning snack and has been drinking more water.  A.m. fasting blood sugars are between 90-100.  Patient is having an apple with sandwich at lunch.  Feels hungry in the afternoon.  She is getting 6 ounces of lean meat and 2 cups of nonstarchy veggies with dinner.  Has a [place] bar and also, a one 10-calorie chicken broth and pita crackers after dinner.  Subjective:   1. Insulin resistance Discussed labs with patient today. Fasting insulin 34.1.  Patient declined use of metformin.  Patient is actively working on dietary changes, exercise and weight loss.  2. Low mean corpuscular hemoglobin concentration (MCHC) Discussed labs with patient today. MCHC mildly low with a normal Hgb and HCT.  Positive history of IDA.  3. Type 2 diabetes mellitus with other specified complication, without long-term current use of insulin ([name]) Discussed labs with patient today. Patient's diabetes is diet controlled with peak A1c at 6.5.  Patient declined the use of medication.  A1c was 6.1.  Assessment/Plan:   1. Insulin resistance Recheck fasting insulin level in 3 months and increase walking time.  2. Low mean corpuscular hemoglobin concentration (MCHC) Check iron levels with next labs.  3. Type 2 diabetes mellitus with other specified complication, without long-term  current use of insulin ([name]) Continue prescribed diet.  Starting to feel reduction in visceral fat.  4. Morbid obesity ([name])  5. Obesity,current BMI 35.0 1.  Continue PT for 6+ more weeks at 2-3 times per week. 2.  Adding an afternoon snack. 3.  Add in 1 starch with dinner.  [name] is currently in the action stage of change. As such, her goal is to continue with weight loss efforts. She has agreed to the Category 1 Plan +200 cal snacks.   Exercise goals:  As is.  Behavioral modification strategies: increasing lean protein intake, increasing vegetables, increasing water intake, decreasing liquid calories, increasing high fiber foods, decreasing eating out, no skipping meals, meal planning and cooking strategies, keeping healthy foods in the home, and planning for success.  [name] has agreed to follow-up with our clinic in 3 weeks. She was informed of the importance of frequent follow-up visits to maximize her success with intensive lifestyle modifications for her multiple health conditions.   Objective:   Blood pressure 114/74, pulse 65, temperature 97.8 F (36.6 C), height 5' 6"$  (1.676 m), weight 216 lb (98 kg), SpO2 98 %. Body mass index is 34.86 kg/m.  General: Cooperative, alert, well developed, in no acute distress. HEENT: Conjunctivae and lids unremarkable. Cardiovascular: Regular rhythm.  Lungs: Normal work of breathing. Neurologic: No focal deficits.   Lab Results  Component Value Date   CREATININE 0.89 05/05/2022   BUN 24 05/05/2022   NA 142 05/05/2022   K 4.1 05/05/2022   CL 101 05/05/2022   CO2 27 05/05/2022  Lab Results  Component Value Date   ALT 29 05/05/2022   AST 24 05/05/2022   ALKPHOS 115 05/05/2022   BILITOT 0.3 05/05/2022   Lab Results  Component Value Date   HGBA1C 6.1 04/27/2022   HGBA1C 6.5 10/20/2021   Lab Results  Component Value Date   INSULIN 34.1 (H) 05/05/2022   Lab Results  Component Value Date   TSH 4.150 05/05/2022   Lab  Results  Component Value Date   CHOL 156 01/11/2022   HDL 51 01/11/2022   LDLCALC 87 01/11/2022   TRIG 95 01/11/2022   CHOLHDL 3.1 01/11/2022   Lab Results  Component Value Date   VD25OH 76.4 04/27/2022   Lab Results  Component Value Date   WBC 3.6 05/05/2022   HGB 13.0 05/05/2022   HCT 41.7 05/05/2022   MCV 81 05/05/2022   PLT 188 05/05/2022   No results found for: "IRON", "TIBC", "FERRITIN"  Attestation Statements:   Reviewed by clinician on day of visit: allergies, medications, problem list, medical history, surgical history, family history, social history, and previous encounter notes.  I have personally spent 30 minutes total time today in preparation, patient care, nutritional counseling and documentation for this visit, including the following: review of clinical lab tests; review of medical tests/procedures/services.    I, Davy Pique, am acting as Location manager for Loyal Gambler, DO.  I have reviewed the above documentation for accuracy and completeness, and I agree with the above. Dell Ponto, DO

## 2022-06-02 DIAGNOSIS — M25562 Pain in left knee: Secondary | ICD-10-CM | POA: Diagnosis not present

## 2022-06-02 DIAGNOSIS — M25551 Pain in right hip: Secondary | ICD-10-CM | POA: Diagnosis not present

## 2022-06-02 DIAGNOSIS — M6281 Muscle weakness (generalized): Secondary | ICD-10-CM | POA: Diagnosis not present

## 2022-06-02 DIAGNOSIS — M25552 Pain in left hip: Secondary | ICD-10-CM | POA: Diagnosis not present

## 2022-06-02 DIAGNOSIS — M25561 Pain in right knee: Secondary | ICD-10-CM | POA: Diagnosis not present

## 2022-06-02 DIAGNOSIS — M25571 Pain in right ankle and joints of right foot: Secondary | ICD-10-CM | POA: Diagnosis not present

## 2022-06-02 DIAGNOSIS — M25512 Pain in left shoulder: Secondary | ICD-10-CM | POA: Diagnosis not present

## 2022-06-03 ENCOUNTER — Other Ambulatory Visit: Payer: Medicare Other | Admitting: Obstetrics and Gynecology

## 2022-06-04 DIAGNOSIS — M6281 Muscle weakness (generalized): Secondary | ICD-10-CM | POA: Diagnosis not present

## 2022-06-04 DIAGNOSIS — M25551 Pain in right hip: Secondary | ICD-10-CM | POA: Diagnosis not present

## 2022-06-04 DIAGNOSIS — M25571 Pain in right ankle and joints of right foot: Secondary | ICD-10-CM | POA: Diagnosis not present

## 2022-06-04 DIAGNOSIS — M25512 Pain in left shoulder: Secondary | ICD-10-CM | POA: Diagnosis not present

## 2022-06-04 DIAGNOSIS — M25562 Pain in left knee: Secondary | ICD-10-CM | POA: Diagnosis not present

## 2022-06-04 DIAGNOSIS — M25552 Pain in left hip: Secondary | ICD-10-CM | POA: Diagnosis not present

## 2022-06-04 DIAGNOSIS — M25561 Pain in right knee: Secondary | ICD-10-CM | POA: Diagnosis not present

## 2022-06-07 ENCOUNTER — Encounter (INDEPENDENT_AMBULATORY_CARE_PROVIDER_SITE_OTHER): Payer: Self-pay | Admitting: Family Medicine

## 2022-06-07 ENCOUNTER — Ambulatory Visit (INDEPENDENT_AMBULATORY_CARE_PROVIDER_SITE_OTHER): Payer: Medicare Other | Admitting: Family Medicine

## 2022-06-07 VITALS — BP 108/72 | HR 65 | Temp 98.3°F | Ht 66.0 in | Wt 212.0 lb

## 2022-06-07 DIAGNOSIS — M79671 Pain in right foot: Secondary | ICD-10-CM | POA: Diagnosis not present

## 2022-06-07 DIAGNOSIS — E88819 Insulin resistance, unspecified: Secondary | ICD-10-CM

## 2022-06-07 DIAGNOSIS — M25571 Pain in right ankle and joints of right foot: Secondary | ICD-10-CM | POA: Diagnosis not present

## 2022-06-07 DIAGNOSIS — E1169 Type 2 diabetes mellitus with other specified complication: Secondary | ICD-10-CM | POA: Diagnosis not present

## 2022-06-07 DIAGNOSIS — M25552 Pain in left hip: Secondary | ICD-10-CM | POA: Diagnosis not present

## 2022-06-07 DIAGNOSIS — M25512 Pain in left shoulder: Secondary | ICD-10-CM | POA: Diagnosis not present

## 2022-06-07 DIAGNOSIS — M25551 Pain in right hip: Secondary | ICD-10-CM | POA: Diagnosis not present

## 2022-06-07 DIAGNOSIS — M25562 Pain in left knee: Secondary | ICD-10-CM | POA: Diagnosis not present

## 2022-06-07 DIAGNOSIS — M6281 Muscle weakness (generalized): Secondary | ICD-10-CM | POA: Diagnosis not present

## 2022-06-07 DIAGNOSIS — M25561 Pain in right knee: Secondary | ICD-10-CM | POA: Diagnosis not present

## 2022-06-07 DIAGNOSIS — Z6834 Body mass index (BMI) 34.0-34.9, adult: Secondary | ICD-10-CM

## 2022-06-07 NOTE — Assessment & Plan Note (Signed)
Right foot pain continues to limit patient's walking time.  She does feel this is the biggest hindrance to her weight loss.  She is currently in physical therapy and has plans to see a podiatrist soon.  We did discuss adding in yoga and chair exercises, resistance training from home in the meantime.

## 2022-06-07 NOTE — Assessment & Plan Note (Signed)
Last fasting insulin elevated at 34.1.  Patient has declined use of metformin but has been working on diet and exercise changes.  She has seen a net weight loss of 10 pounds in 6 weeks of medically supervised weight management.  Plan to recheck fasting insulin along with A1c in the next 3 months.

## 2022-06-07 NOTE — Assessment & Plan Note (Signed)
Lab Results  Component Value Date   HGBA1C 6.1 04/27/2022   Reviewed labs from January 2024.  A1c was reduced from 6.5-6.1.  She has declined starting metformin.  She has been working on reducing her intake of added sugar and refined carbohydrates will focus on lean protein and fiber with meals.  She does have room for improvement with regular physical activity, limited by foot pain.  Continue actively working on weight reduction.  Recheck A1c in the next 3 months.

## 2022-06-07 NOTE — Progress Notes (Signed)
Office: [phone]  /  Fax: [phone]  WEIGHT SUMMARY AND BIOMETRICS  Vitals Temp: 98.3 F (36.8 C) BP: 108/72 Pulse Rate: 65 SpO2: 95 %   Anthropometric Measurements Height: '5\' 6"'$  (1.676 m) Weight: 212 lb (96.2 kg) BMI (Calculated): 34.23 Weight at Last Visit: 216lb Weight Lost Since Last Visit: 4lb Starting Weight: 22lb Total Weight Loss (lbs): 10 lb (4.536 kg)   Body Composition  Body Fat %: 44 % Fat Mass (lbs): 93.2 lbs Muscle Mass (lbs): 112.8 lbs Total Body Water (lbs): 86.8 lbs Visceral Fat Rating : 13   Other Clinical Data RMR: [id number] Fasting: no Labs: no Today's Visit #: 3 Starting Date: 05/05/22     HPI  Chief Complaint: OBESITY  [name] is here to discuss her progress with her obesity treatment plan. She is on the the Category 1 Plan + 200 calories and states she is following her eating plan approximately 90 % of the time. She states she is exercising 60 minutes 3 times per week.   Interval History:  Since last office visit she is down 4 lb.  Net weight loss is 10 lb in 6 weeks.  She will be finishing PT in March.  She plans to use resistance bands at home.  She plans to do some line dancing.    She added in a 200 calorie snack that includes carb which makes her feel better.  She added more fiber with dinner.  She did indulge in a dessert while travelling.    She did lose 3.8 lb of muscle mass- foot pain has limited her walking She is getting lean protein with her meals  Pharmacotherapy: none  PHYSICAL EXAM:  Blood pressure 108/72, pulse 65, temperature 98.3 F (36.8 C), height '5\' 6"'$  (1.676 m), weight 212 lb (96.2 kg), SpO2 95 %. Body mass index is 34.22 kg/m.  General: She is overweight, cooperative, alert, well developed, and in no acute distress. PSYCH: Has normal mood, affect and thought process.   HEENT: EOMI, sclerae are anicteric. Lungs: Normal breathing effort, no conversational dyspnea. Extremities: No edema.  Neurologic:  No gross sensory or motor deficits. No tremors or fasciculations noted.    DIAGNOSTIC DATA REVIEWED:  BMET    Component Value Date/Time   NA 142 05/05/2022 1008   K 4.1 05/05/2022 1008   CL 101 05/05/2022 1008   CO2 27 05/05/2022 1008   GLUCOSE 90 05/05/2022 1008   GLUCOSE 97 03/18/2009 0555   BUN 24 05/05/2022 1008   CREATININE 0.89 05/05/2022 1008   CALCIUM 9.2 05/05/2022 1008   GFRNONAA >60 03/18/2009 0555   GFRAA  03/18/2009 0555    >60        The eGFR has been calculated using the MDRD equation. This calculation has not been validated in all clinical situations. eGFR's persistently <60 mL/min signify possible Chronic Kidney Disease.   Lab Results  Component Value Date   HGBA1C 6.1 04/27/2022   HGBA1C 6.5 10/20/2021   Lab Results  Component Value Date   INSULIN 34.1 (H) 05/05/2022   Lab Results  Component Value Date   TSH 4.150 05/05/2022   CBC    Component Value Date/Time   WBC 3.6 05/05/2022 1008   WBC 6.1 03/18/2009 0555   RBC 5.12 05/05/2022 1008   RBC 4.43 03/18/2009 0555   HGB 13.0 05/05/2022 1008   HCT 41.7 05/05/2022 1008   PLT 188 05/05/2022 1008   MCV 81 05/05/2022 1008   MCH 25.4 (L) 05/05/2022 1008  MCHC 31.2 (L) 05/05/2022 1008   MCHC 33.2 03/18/2009 0555   RDW 13.2 05/05/2022 1008   Iron Studies No results found for: "IRON", "TIBC", "FERRITIN", "IRONPCTSAT" Lipid Panel     Component Value Date/Time   CHOL 156 01/11/2022 1048   TRIG 95 01/11/2022 1048   HDL 51 01/11/2022 1048   CHOLHDL 3.1 01/11/2022 1048   LDLCALC 87 01/11/2022 1048   Hepatic Function Panel     Component Value Date/Time   PROT 6.8 05/05/2022 1008   ALBUMIN 4.3 05/05/2022 1008   AST 24 05/05/2022 1008   ALT 29 05/05/2022 1008   ALKPHOS 115 05/05/2022 1008   BILITOT 0.3 05/05/2022 1008   BILIDIR 0.12 02/11/2022 1043      Component Value Date/Time   TSH 4.150 05/05/2022 1008   Nutritional Lab Results  Component Value Date   VD25OH 76.4 04/27/2022      ASSESSMENT AND PLAN  TREATMENT PLAN FOR OBESITY:  Recommended Dietary Goals  Lerissa is currently in the action stage of change. As such, her goal is to continue weight management plan. She has agreed to the Category 1 Plan.  Behavioral Intervention  We discussed the following Behavioral Modification Strategies today: increasing lean protein intake, increasing vegetables, increasing lower sugar fruits, increasing fiber rich foods, increasing water intake, and work on meal planning and easy cooking plans.  Additional resources provided today: NA  Recommended Physical Activity Goals  Nela has been advised to work up to 150 minutes of moderate intensity aerobic activity a week and strengthening exercises 2-3 times per week for cardiovascular health, weight loss maintenance and preservation of muscle mass.   She has agreed to Will begin resistance exercise 20 minutes, 3 times per week. Chosen activity chair exercise/ yoga.   Pharmacotherapy We discussed various medication options to help Maigen with her weight loss efforts and we both agreed to none.  ASSOCIATED CONDITIONS ADDRESSED TODAY  Type 2 diabetes mellitus with other specified complication, without long-term current use of insulin [facility]) Assessment & Plan: Lab Results  Component Value Date   HGBA1C 6.1 04/27/2022   Reviewed labs from January 2024.  A1c was reduced from 6.5-6.1.  She has declined starting metformin.  She has been working on reducing her intake of added sugar and refined carbohydrates will focus on lean protein and fiber with meals.  She does have room for improvement with regular physical activity, limited by foot pain.  Continue actively working on weight reduction.  Recheck A1c in the next 3 months.   Morbid obesity (HCC)  BMI 34.0-34.9,adult  Insulin resistance Assessment & Plan: Last fasting insulin elevated at 34.1.  Patient has declined use of metformin but has been working on diet and  exercise changes.  She has seen a net weight loss of 10 pounds in 6 weeks of medically supervised weight management.  Plan to recheck fasting insulin along with A1c in the next 3 months.   Right foot pain Assessment & Plan: Right foot pain continues to limit patient's walking time.  She does feel this is the biggest hindrance to her weight loss.  She is currently in physical therapy and has plans to see a podiatrist soon.  We did discuss adding in yoga and chair exercises, resistance training from home in the meantime.       No follow-ups on file.Marland Kitchen She was informed of the importance of frequent follow up visits to maximize her success with intensive lifestyle modifications for her multiple health conditions.   ATTESTASTION  STATEMENTS:  Reviewed by clinician on day of visit: allergies, medications, problem list, medical history, surgical history, family history, social history, and previous encounter notes.   I have personally spent 30 minutes total time today in preparation, patient care, nutritional counseling and documentation for this visit, including the following: review of clinical lab tests; review of medical tests/procedures/services.      Dell Ponto, DO

## 2022-06-10 DIAGNOSIS — M25571 Pain in right ankle and joints of right foot: Secondary | ICD-10-CM | POA: Diagnosis not present

## 2022-06-10 DIAGNOSIS — M25512 Pain in left shoulder: Secondary | ICD-10-CM | POA: Diagnosis not present

## 2022-06-10 DIAGNOSIS — M25562 Pain in left knee: Secondary | ICD-10-CM | POA: Diagnosis not present

## 2022-06-10 DIAGNOSIS — M25552 Pain in left hip: Secondary | ICD-10-CM | POA: Diagnosis not present

## 2022-06-10 DIAGNOSIS — M25561 Pain in right knee: Secondary | ICD-10-CM | POA: Diagnosis not present

## 2022-06-10 DIAGNOSIS — M6281 Muscle weakness (generalized): Secondary | ICD-10-CM | POA: Diagnosis not present

## 2022-06-10 DIAGNOSIS — M25551 Pain in right hip: Secondary | ICD-10-CM | POA: Diagnosis not present

## 2022-06-14 DIAGNOSIS — M25571 Pain in right ankle and joints of right foot: Secondary | ICD-10-CM | POA: Diagnosis not present

## 2022-06-14 DIAGNOSIS — M25512 Pain in left shoulder: Secondary | ICD-10-CM | POA: Diagnosis not present

## 2022-06-14 DIAGNOSIS — M25551 Pain in right hip: Secondary | ICD-10-CM | POA: Diagnosis not present

## 2022-06-14 DIAGNOSIS — M25552 Pain in left hip: Secondary | ICD-10-CM | POA: Diagnosis not present

## 2022-06-14 DIAGNOSIS — M6281 Muscle weakness (generalized): Secondary | ICD-10-CM | POA: Diagnosis not present

## 2022-06-14 DIAGNOSIS — M25562 Pain in left knee: Secondary | ICD-10-CM | POA: Diagnosis not present

## 2022-06-14 DIAGNOSIS — M25561 Pain in right knee: Secondary | ICD-10-CM | POA: Diagnosis not present

## 2022-06-16 DIAGNOSIS — M25552 Pain in left hip: Secondary | ICD-10-CM | POA: Diagnosis not present

## 2022-06-16 DIAGNOSIS — M25551 Pain in right hip: Secondary | ICD-10-CM | POA: Diagnosis not present

## 2022-06-16 DIAGNOSIS — M25561 Pain in right knee: Secondary | ICD-10-CM | POA: Diagnosis not present

## 2022-06-16 DIAGNOSIS — M6281 Muscle weakness (generalized): Secondary | ICD-10-CM | POA: Diagnosis not present

## 2022-06-16 DIAGNOSIS — M25571 Pain in right ankle and joints of right foot: Secondary | ICD-10-CM | POA: Diagnosis not present

## 2022-06-16 DIAGNOSIS — M25562 Pain in left knee: Secondary | ICD-10-CM | POA: Diagnosis not present

## 2022-06-16 DIAGNOSIS — M25512 Pain in left shoulder: Secondary | ICD-10-CM | POA: Diagnosis not present

## 2022-06-17 ENCOUNTER — Ambulatory Visit: Payer: Medicare Other | Admitting: Obstetrics and Gynecology

## 2022-06-17 ENCOUNTER — Encounter: Payer: Self-pay | Admitting: Obstetrics and Gynecology

## 2022-06-17 VITALS — BP 123/78 | HR 64

## 2022-06-17 DIAGNOSIS — R310 Gross hematuria: Secondary | ICD-10-CM

## 2022-06-17 DIAGNOSIS — R350 Frequency of micturition: Secondary | ICD-10-CM | POA: Diagnosis not present

## 2022-06-17 LAB — POCT URINALYSIS DIPSTICK
Bilirubin, UA: NEGATIVE
Glucose, UA: NEGATIVE
Ketones, UA: NEGATIVE
Leukocytes, UA: NEGATIVE
Nitrite, UA: NEGATIVE
Protein, UA: NEGATIVE
Spec Grav, UA: 1.025 (ref 1.010–1.025)
Urobilinogen, UA: 0.2 E.U./dL
pH, UA: 6 (ref 5.0–8.0)

## 2022-06-17 MED ORDER — LIDOCAINE HCL URETHRAL/MUCOSAL 2 % EX GEL
1.0000 | Freq: Once | CUTANEOUS | Status: AC
  Administered 2022-06-17: 1 via URETHRAL

## 2022-06-17 NOTE — Progress Notes (Signed)
CYSTOSCOPY  CC:  This is a 67 y.o. with gross hematuria who presents today for cystoscopy.  POC urine: small blood, otherwise negative  BP 123/78   Pulse 64   CYSTOSCOPY: A time out was performed.  The periurethral area was prepped and draped in a sterile manner.  2% lidocaine jetpack was inserted at the urethral meatus and the urethra and bladder visualized with 12- and 70-degree scopes.  She had normal urethral coaptation and normal urethral mucosa.  She had normal bladder mucosa. She had bilateral clear efflux from both ureteral orifices.  She had no squamous metaplasia at the trigone, no trabeculations, cellules or diverticuli.    CT A/P 02/18/22 IMPRESSION: 1. No CT findings to explain hematuria. No evidence of urinary tract calculus, mass, or hydronephrosis. No urinary tract filling defect on delayed phase imaging. 2. Status post appendectomy and hysterectomy.   ASSESSMENT:  67 y.o. with gross hematuria. Cystoscopy today is normal. CT performed in Nov 2023 negative as well  PLAN:  - Recommend repeat urinalysis in one year or sooner if symptoms change. She will obtain with this with her PCP who does this yearly.   Follow up as needed   Jaquita Folds, MD

## 2022-06-17 NOTE — Addendum Note (Signed)
Addended by: Jaquita Folds on: 06/17/2022 09:51 AM   Modules accepted: Orders

## 2022-06-17 NOTE — Patient Instructions (Signed)
Taking Care of Yourself after Urodynamics, Cystoscopy, Bulkamid Injection, or Botox Injection   Drink plenty of water for a day or two following your procedure. Try to have about 8 ounces (one cup) at a time, and do this 6 times or more per day unless you have fluid restrictitons AVOID irritative beverages such as coffee, tea, soda, alcoholic or citrus drinks for a day or two, as this may cause burning with urination.  For the first 1-2 days after the procedure, your urine may be pink or red in color. You may have some blood in your urine as a normal side effect of the procedure. Large amounts of bleeding or difficulty urinating are NOT normal. Call the nurse line if this happens or go to the nearest Emergency Room if the bleeding is heavy or you cannot urinate at all and it is after hours.  You may experience some discomfort or a burning sensation with urination after having this procedure. You can use over the counter Azo or pyridium to help with burning and follow the instructions on the packaging. If it does not improve within 1-2 days, or other symptoms appear (fever, chills, or difficulty urinating) call the office to speak to a nurse.  You may return to normal daily activities such as work, school, driving, exercising and housework on the day of the procedure.

## 2022-06-18 DIAGNOSIS — M25551 Pain in right hip: Secondary | ICD-10-CM | POA: Diagnosis not present

## 2022-06-18 DIAGNOSIS — M25571 Pain in right ankle and joints of right foot: Secondary | ICD-10-CM | POA: Diagnosis not present

## 2022-06-18 DIAGNOSIS — M6281 Muscle weakness (generalized): Secondary | ICD-10-CM | POA: Diagnosis not present

## 2022-06-18 DIAGNOSIS — M25552 Pain in left hip: Secondary | ICD-10-CM | POA: Diagnosis not present

## 2022-06-18 DIAGNOSIS — M25561 Pain in right knee: Secondary | ICD-10-CM | POA: Diagnosis not present

## 2022-06-18 DIAGNOSIS — M25512 Pain in left shoulder: Secondary | ICD-10-CM | POA: Diagnosis not present

## 2022-06-18 DIAGNOSIS — M25562 Pain in left knee: Secondary | ICD-10-CM | POA: Diagnosis not present

## 2022-06-21 DIAGNOSIS — M25551 Pain in right hip: Secondary | ICD-10-CM | POA: Diagnosis not present

## 2022-06-21 DIAGNOSIS — M25561 Pain in right knee: Secondary | ICD-10-CM | POA: Diagnosis not present

## 2022-06-21 DIAGNOSIS — M6281 Muscle weakness (generalized): Secondary | ICD-10-CM | POA: Diagnosis not present

## 2022-06-21 DIAGNOSIS — M25552 Pain in left hip: Secondary | ICD-10-CM | POA: Diagnosis not present

## 2022-06-21 DIAGNOSIS — M25571 Pain in right ankle and joints of right foot: Secondary | ICD-10-CM | POA: Diagnosis not present

## 2022-06-21 DIAGNOSIS — M25512 Pain in left shoulder: Secondary | ICD-10-CM | POA: Diagnosis not present

## 2022-06-21 DIAGNOSIS — M25562 Pain in left knee: Secondary | ICD-10-CM | POA: Diagnosis not present

## 2022-06-23 DIAGNOSIS — M25571 Pain in right ankle and joints of right foot: Secondary | ICD-10-CM | POA: Diagnosis not present

## 2022-06-23 DIAGNOSIS — M6281 Muscle weakness (generalized): Secondary | ICD-10-CM | POA: Diagnosis not present

## 2022-06-23 DIAGNOSIS — M25561 Pain in right knee: Secondary | ICD-10-CM | POA: Diagnosis not present

## 2022-06-23 DIAGNOSIS — M25562 Pain in left knee: Secondary | ICD-10-CM | POA: Diagnosis not present

## 2022-06-23 DIAGNOSIS — M25512 Pain in left shoulder: Secondary | ICD-10-CM | POA: Diagnosis not present

## 2022-06-23 DIAGNOSIS — M25551 Pain in right hip: Secondary | ICD-10-CM | POA: Diagnosis not present

## 2022-06-23 DIAGNOSIS — M25552 Pain in left hip: Secondary | ICD-10-CM | POA: Diagnosis not present

## 2022-06-24 ENCOUNTER — Other Ambulatory Visit: Payer: Self-pay | Admitting: Podiatry

## 2022-06-24 ENCOUNTER — Encounter: Payer: Self-pay | Admitting: Podiatry

## 2022-06-24 ENCOUNTER — Ambulatory Visit: Payer: Medicare Other | Admitting: Podiatry

## 2022-06-24 ENCOUNTER — Ambulatory Visit (INDEPENDENT_AMBULATORY_CARE_PROVIDER_SITE_OTHER): Payer: Medicare Other

## 2022-06-24 VITALS — BP 113/65 | HR 71

## 2022-06-24 DIAGNOSIS — M778 Other enthesopathies, not elsewhere classified: Secondary | ICD-10-CM

## 2022-06-24 DIAGNOSIS — M79609 Pain in unspecified limb: Secondary | ICD-10-CM | POA: Insufficient documentation

## 2022-06-24 DIAGNOSIS — M2140 Flat foot [pes planus] (acquired), unspecified foot: Secondary | ICD-10-CM

## 2022-06-24 DIAGNOSIS — M66871 Spontaneous rupture of other tendons, right ankle and foot: Secondary | ICD-10-CM

## 2022-06-24 DIAGNOSIS — M2142 Flat foot [pes planus] (acquired), left foot: Secondary | ICD-10-CM

## 2022-06-24 DIAGNOSIS — R682 Dry mouth, unspecified: Secondary | ICD-10-CM | POA: Insufficient documentation

## 2022-06-24 DIAGNOSIS — M2560 Stiffness of unspecified joint, not elsewhere classified: Secondary | ICD-10-CM | POA: Insufficient documentation

## 2022-06-24 DIAGNOSIS — M2540 Effusion, unspecified joint: Secondary | ICD-10-CM | POA: Insufficient documentation

## 2022-06-24 DIAGNOSIS — M2141 Flat foot [pes planus] (acquired), right foot: Secondary | ICD-10-CM

## 2022-06-24 DIAGNOSIS — R768 Other specified abnormal immunological findings in serum: Secondary | ICD-10-CM | POA: Insufficient documentation

## 2022-06-24 NOTE — Progress Notes (Signed)
Subjective:  Patient ID: [name], female    DOB: December 29, 1955,  MRN: [id number] HPI Chief Complaint  Patient presents with   Foot Pain    Lateral foot/ankle right - arch fell in January, aching and numbness, swelling, affecting sciatica, in PT-tried massage, stimulator, toe lifts-hasn't helped, right leg is also shorter   New Patient (Initial Visit)   Diabetes    Last A1c was 6.1    67 y.o. female presents with the above complaint.   ROS: Denies fever chills nausea vomit muscle aches pains calf pain back pain chest pain shortness of breath.  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   HTN (hypertension)    Joint pain    Lactose intolerance    Multiple food allergies    OA (osteoarthritis)    Reflux    Sjogren's syndrome (HCC)    SOB (shortness of breath)    Swallowing difficulty    Thyroid condition    Vitamin D deficiency    Past Surgical History:  Procedure Laterality Date   APPENDECTOMY     BACK SURGERY  03/12/2010   L5   BREAST SURGERY     BIOPSY   HERNIA REPAIR     groin bilateral   NECK SURGERY     PELVIC LAPAROSCOPY  [id number]   PILONIDAL CYST EXCISION     SALPINGOOPHORECTOMY  10/11/2002   SCOPE LSO-LEFT SEROUS CYSTADENOMA   SALPINGOOPHORECTOMY  04/12/2005   RSO/TOA   SHOULDER SURGERY Right 01/2018   TONSILLECTOMY AND ADENOIDECTOMY     TUBAL LIGATION     VAGINAL HYSTERECTOMY  01/10/2005   TVH, POSTERIOR REPAIR    Current Outpatient Medications:    acetaminophen (TYLENOL) 650 MG CR tablet, Take 650 mg by mouth as needed.  , Disp: , Rfl:    albuterol (VENTOLIN HFA) 108 (90 Base) MCG/ACT inhaler, Inhale 2 puffs into the lungs every 6 (six) hours as needed for wheezing or shortness of breath., Disp: 8 g, Rfl: 5   allopurinol (ZYLOPRIM) 100 MG tablet, Take 1 tablet (100 mg  total) by mouth daily., Disp: 30 tablet, Rfl: 6   ascorbic acid (VITAMIN C) 500 MG tablet, Take 500 mg by mouth daily., Disp: , Rfl:    atorvastatin (LIPITOR) 10 MG tablet, 1 tab PO Q Mon/Wed/Frid, Disp: 12 tablet, Rfl: 11   b complex vitamins capsule, Take 1 capsule by mouth daily. 1 chewable once daily., Disp: , Rfl:    benzonatate (TESSALON) 200 MG capsule, Take 1 capsule (200 mg total) by mouth 3 (three) times daily as needed for cough., Disp: 30 capsule, Rfl: 6   BIOTIN PO, Take by mouth., Disp: , Rfl:    Calcium 500-2.5 MG-MCG CHEW, Chew 2 tablets by mouth daily., Disp: , Rfl:    Cetirizine HCl (ZYRTEC PO), Take 10 mg by mouth. , Disp: , Rfl:    Cholecalciferol (VITAMIN D PO), Take by mouth. TAKES 2000 , Disp: , Rfl:    Cinnamon 500 MG TABS, Take 2 tablets by mouth daily., Disp: , Rfl:    Continuous Blood Gluc Receiver (FREESTYLE LIBRE 2 READER) DEVI, Use to check blood sugar three times daily., Disp: 1 each, Rfl: 0   Continuous Blood Gluc Sensor (FREESTYLE LIBRE  2 SENSOR) MISC, Use to check blood sugar three times daily. Change sensors once every 14 days., Disp: 6 each, Rfl: 0   cyclobenzaprine (FLEXERIL) 10 MG tablet, Take 1 tablet (10 mg total) by mouth 3 (three) times daily as needed., Disp: 30 tablet, Rfl: 3   cycloSPORINE (RESTASIS) 0.05 % ophthalmic emulsion, 1 drop 2 (two) times daily., Disp: , Rfl:    dexlansoprazole (DEXILANT) 60 MG capsule, Take 1 capsule (60 mg total) by mouth every morning., Disp: 30 capsule, Rfl: 5   diclofenac Sodium (VOLTAREN) 1 % GEL, Apply 2 g topically daily. Once daily in the morning as needed., Disp: , Rfl:    EPINEPHrine 0.3 mg/0.3 mL IJ SOAJ injection, Inject 0.3 mg into the muscle as needed for anaphylaxis., Disp: 1 each, Rfl: 1   ergocalciferol (VITAMIN D2) 1.25 MG (50000 UT) capsule, Take 1 capsule (50,000 Units total) by mouth once a week., Disp: 12 capsule, Rfl: 1   Glucose 15 g PACK, Take by mouth. Chew up to 4 tablets prn., Disp: , Rfl:     glucose blood (FREESTYLE LITE) test strip, Use as instructed, Disp: 100 each, Rfl: 12   hydrochlorothiazide (HYDRODIURIL) 25 MG tablet, Take 1 tablet (25 mg total) by mouth 2 (two) times daily., Disp: 180 tablet, Rfl: 2   hydroxychloroquine (PLAQUENIL) 200 MG tablet, Take 2 tabs Orally daily for 90 days, Disp: , Rfl:    ipratropium-albuterol (DUONEB) 0.5-2.5 (3) MG/3ML SOLN, Take 3 mLs by nebulization every 6 (six) hours as needed., Disp: 100 mL, Rfl: 3   Lancets (FREESTYLE) lancets, Use as instructed, Disp: 100 each, Rfl: 12   meclizine (ANTIVERT) 25 MG tablet, Take 1 tablet (25 mg total) by mouth 2 (two) times daily as needed for dizziness., Disp: 30 tablet, Rfl: 1   meloxicam (MOBIC) 15 MG tablet, Take 1 tablet (15 mg total) by mouth daily., Disp: 30 tablet, Rfl: 5   montelukast (SINGULAIR) 10 MG tablet, Take 1 tablet (10 mg total) by mouth at bedtime., Disp: 30 tablet, Rfl: 11   Multiple Vitamins-Iron (MULTIVITAMIN/IRON PO), Take by mouth.  , Disp: , Rfl:    nebivolol (BYSTOLIC) 5 MG tablet, Take 1 tablet (5 mg total) by mouth every morning., Disp: 90 tablet, Rfl: 3   polyethylene glycol (MIRALAX / GLYCOLAX) 17 g packet, Take 17 g by mouth daily., Disp: , Rfl:    triamcinolone (NASACORT) 55 MCG/ACT nasal inhaler, Place 2 sprays into the nose as needed.  , Disp: , Rfl:    valACYclovir (VALTREX) 500 MG tablet, Take 1 tablet (500 mg total) by mouth daily., Disp: 30 tablet, Rfl: 11   vitamin E 180 MG (400 UNITS) capsule, Take 400 Units by mouth in the morning and at bedtime., Disp: , Rfl:   Allergies  Allergen Reactions   Codeine Swelling   Duratuss [Phenylephrine-Guaifenesin] Hives   Erythromycin Swelling    ALLERGIC TO MYCINS   Hydrocodone-Acetaminophen Swelling   Iodine Swelling   Latex Swelling   Oxycodone Swelling   Peanuts [Nuts] Swelling   Penicillins Swelling   Poultry Meal Swelling   Shellfish Allergy Swelling   Vicodin [Hydrocodone-Acetaminophen] Swelling   Review of  Systems Objective:   Vitals:   06/24/22 0940  BP: 113/65  Pulse: 71    General: Well developed, nourished, in no acute distress, alert and oriented x3   Dermatological: Skin is warm, dry and supple bilateral. Nails x 10 are well maintained; remaining integument appears unremarkable at this time. There are no open sores,  no preulcerative lesions, no rash or signs of infection present.  Vascular: Dorsalis Pedis artery and Posterior Tibial artery pedal pulses are 2/4 bilateral with immedate capillary fill time. Pedal hair growth present. No varicosities and no lower extremity edema present bilateral.   Neruologic: Grossly intact via light touch bilateral. Vibratory intact via tuning fork bilateral. Protective threshold with Semmes Wienstein monofilament intact to all pedal sites bilateral. Patellar and Achilles deep tendon reflexes 2+ bilateral. No Babinski or clonus noted bilateral.   Musculoskeletal: No gross boney pedal deformities bilateral. No pain, crepitus, or limitation noted with foot and ankle range of motion bilateral.  Muscle strength of the left foot is normal 5/5 dorsiflexors plantar flexors inverters and everters.  She has 5/5 dorsiflexors and plantar flexors on the right foot however she has 0 inverter against resistance.  She has a basically very flail very flexible midfoot right.  Significant pain on palpation there appears to be fluctuance in the tendon sheath itself.  Gait: Unassisted, Nonantalgic.    Radiographs:  Radiographs taken today demonstrate osseously mature individual with good bone mineralization.  Severe flatfoot deformity with abduction of the forefoot on the rear foot.  Complete dislocation of the talonavicular joint with valgus deformity of the of the heel.  Soft tissue swelling along the medial aspect of the foot.  Assessment & Plan:   Assessment: Probable complete tear of the posterior tibial tendon right foot.  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  Plan: Discussed etiology pathology conservative versus surgical therapies at this point requesting MRI for evaluation of the posterior tibial tendon most likely a tear requesting this for surgical intervention and planning.  Also requesting this for differential diagnoses.  Also placed her in a cam boot today to help offload to offload the pressures and pain of the midfoot.  I will follow-up with her once the MRI has been completed.     Arlon Bleier T. [place], [place]

## 2022-06-25 DIAGNOSIS — M25561 Pain in right knee: Secondary | ICD-10-CM | POA: Diagnosis not present

## 2022-06-25 DIAGNOSIS — M6281 Muscle weakness (generalized): Secondary | ICD-10-CM | POA: Diagnosis not present

## 2022-06-25 DIAGNOSIS — M25552 Pain in left hip: Secondary | ICD-10-CM | POA: Diagnosis not present

## 2022-06-25 DIAGNOSIS — M25562 Pain in left knee: Secondary | ICD-10-CM | POA: Diagnosis not present

## 2022-06-25 DIAGNOSIS — M25512 Pain in left shoulder: Secondary | ICD-10-CM | POA: Diagnosis not present

## 2022-06-25 DIAGNOSIS — M25551 Pain in right hip: Secondary | ICD-10-CM | POA: Diagnosis not present

## 2022-06-25 DIAGNOSIS — M25571 Pain in right ankle and joints of right foot: Secondary | ICD-10-CM | POA: Diagnosis not present

## 2022-06-28 DIAGNOSIS — M25571 Pain in right ankle and joints of right foot: Secondary | ICD-10-CM | POA: Diagnosis not present

## 2022-06-28 DIAGNOSIS — M25561 Pain in right knee: Secondary | ICD-10-CM | POA: Diagnosis not present

## 2022-06-28 DIAGNOSIS — M25551 Pain in right hip: Secondary | ICD-10-CM | POA: Diagnosis not present

## 2022-06-28 DIAGNOSIS — M25512 Pain in left shoulder: Secondary | ICD-10-CM | POA: Diagnosis not present

## 2022-06-28 DIAGNOSIS — M25562 Pain in left knee: Secondary | ICD-10-CM | POA: Diagnosis not present

## 2022-06-28 DIAGNOSIS — M6281 Muscle weakness (generalized): Secondary | ICD-10-CM | POA: Diagnosis not present

## 2022-06-28 DIAGNOSIS — M25552 Pain in left hip: Secondary | ICD-10-CM | POA: Diagnosis not present

## 2022-06-30 DIAGNOSIS — M25571 Pain in right ankle and joints of right foot: Secondary | ICD-10-CM | POA: Diagnosis not present

## 2022-06-30 DIAGNOSIS — M25512 Pain in left shoulder: Secondary | ICD-10-CM | POA: Diagnosis not present

## 2022-06-30 DIAGNOSIS — M25552 Pain in left hip: Secondary | ICD-10-CM | POA: Diagnosis not present

## 2022-06-30 DIAGNOSIS — M25551 Pain in right hip: Secondary | ICD-10-CM | POA: Diagnosis not present

## 2022-06-30 DIAGNOSIS — M6281 Muscle weakness (generalized): Secondary | ICD-10-CM | POA: Diagnosis not present

## 2022-06-30 DIAGNOSIS — M25562 Pain in left knee: Secondary | ICD-10-CM | POA: Diagnosis not present

## 2022-06-30 DIAGNOSIS — M25561 Pain in right knee: Secondary | ICD-10-CM | POA: Diagnosis not present

## 2022-07-01 ENCOUNTER — Ambulatory Visit (INDEPENDENT_AMBULATORY_CARE_PROVIDER_SITE_OTHER): Payer: Medicare Other | Admitting: Family Medicine

## 2022-07-01 ENCOUNTER — Encounter (INDEPENDENT_AMBULATORY_CARE_PROVIDER_SITE_OTHER): Payer: Self-pay | Admitting: Family Medicine

## 2022-07-01 VITALS — BP 115/76 | HR 69 | Temp 98.7°F | Ht 66.0 in | Wt 213.0 lb

## 2022-07-01 DIAGNOSIS — E11649 Type 2 diabetes mellitus with hypoglycemia without coma: Secondary | ICD-10-CM

## 2022-07-01 DIAGNOSIS — Z6834 Body mass index (BMI) 34.0-34.9, adult: Secondary | ICD-10-CM

## 2022-07-01 DIAGNOSIS — M79671 Pain in right foot: Secondary | ICD-10-CM

## 2022-07-01 NOTE — Assessment & Plan Note (Signed)
Stable.  In PT and has seen podiatry.  Looking into a second opinion.  Would like to avoid ankle fusion.  R foot pain is limiting her walking time.  She is doing some limited weight training at PT and at home.  Plan: Continue PT  Consider water exercise, bike, rower at the gym

## 2022-07-01 NOTE — Progress Notes (Signed)
Office: [phone]  /  Fax: [place]  Starting Date: 05/05/22  Starting Weight: 222lb   Weight Lost Since Last Visit: 0   Vitals Temp: 98.7 F (37.1 C) BP: 115/76 Pulse Rate: 69 SpO2: 100 %   Body Composition  Body Fat %: 43.9 % Fat Mass (lbs): 93.8 lbs Muscle Mass (lbs): 113.8 lbs Total Body Water (lbs): 83.2 lbs Visceral Fat Rating : 13   HPI  Chief Complaint: OBESITY  [name] is here to discuss her progress with her obesity treatment plan. She is on the the Category 1 Plan and states she is following her eating plan approximately 95 % of the time. She states she is exercising 60 minutes 3 times per week.   Interval History:  Since last office visit she is up 1 lb Gained a pound of muscle She celebrated her birthday and had several meals out She plans to add in upper body weight training and core work Finishes PT for her R foot next week.  Awaiting an MRI Sunday. She has had more constipation and felt bad from eating sweets She has increased her water intake  Pharmacotherapy: none  PHYSICAL EXAM:  Blood pressure 115/76, pulse 69, temperature 98.7 F (37.1 C), height 5\' 6"  (1.676 m), weight 213 lb (96.6 kg), SpO2 100 %. Body mass index is 34.38 kg/m.  General: She is overweight, cooperative, alert, well developed, and in no acute distress. PSYCH: Has normal mood, affect and thought process.   Lungs: Normal breathing effort, no conversational dyspnea.  ASSESSMENT AND PLAN  TREATMENT PLAN FOR OBESITY:  Recommended Dietary Goals  [name] is currently in the action stage of change. As such, her goal is to continue weight management plan. She has agreed to the Category 1 Plan.  Behavioral Intervention  We discussed the following Behavioral Modification Strategies today: increasing lean protein intake, increasing vegetables, increasing water intake, work on meal planning and easy cooking plans, work on managing  stress, creating time for self-care and relaxation measures, avoiding temptations and identifying enticing environmental cues, planning for success, and keeping healthy foods at home.  Additional resources provided today: NA  Recommended Physical Activity Goals  [name] has been advised to work up to 150 minutes of moderate intensity aerobic activity a week and strengthening exercises 2-3 times per week for cardiovascular health, weight loss maintenance and preservation of muscle mass.   She has agreed to Continue current level of physical activity  and Start strengthening exercises with a goal of 2-3 sessions a week   Pharmacotherapy changes for the treatment of obesity: none  ASSOCIATED CONDITIONS ADDRESSED TODAY  Type 2 diabetes mellitus with hypoglycemia without coma, without long-term current use of insulin [facility]) Assessment & Plan: Lab Results  Component Value Date   HGBA1C 6.1 04/27/2022   She has declined use of metformin or GLP-1 agonists.  She is seeing improvements.  A1c was 6.5--> 6.1.  she plans to resume using her CGM.  Walking is limited due to chronic foot pain.     Morbid obesity ()  BMI 34.0-34.9,adult  Right foot pain Assessment & Plan: Stable.  In PT and has seen podiatry.  Looking into a second opinion.  Would like to avoid ankle fusion.  R foot pain is limiting her walking time.  She is doing some limited weight training at PT and at home.  Plan: Continue PT  Consider water exercise, bike, rower at the gym       She was  informed of the importance of frequent follow up visits to maximize her success with intensive lifestyle modifications for her multiple health conditions.   ATTESTASTION STATEMENTS:  Reviewed by clinician on day of visit: allergies, medications, problem list, medical history, surgical history, family history, social history, and previous encounter notes pertinent to obesity diagnosis.   I have personally spent 30 minutes total  time today in preparation, patient care, nutritional counseling and documentation for this visit, including the following: review of clinical lab tests; review of medical tests/procedures/services.      Dell Ponto, DO DABFM, DABOM [facility] [address], [place] [address] [phone]

## 2022-07-01 NOTE — Assessment & Plan Note (Signed)
Lab Results  Component Value Date   HGBA1C 6.1 04/27/2022   She has declined use of metformin or GLP-1 agonists.  She is seeing improvements.  A1c was 6.5--> 6.1.  she plans to resume using her CGM.  Walking is limited due to chronic foot pain.

## 2022-07-02 DIAGNOSIS — M25562 Pain in left knee: Secondary | ICD-10-CM | POA: Diagnosis not present

## 2022-07-02 DIAGNOSIS — M6281 Muscle weakness (generalized): Secondary | ICD-10-CM | POA: Diagnosis not present

## 2022-07-02 DIAGNOSIS — M25571 Pain in right ankle and joints of right foot: Secondary | ICD-10-CM | POA: Diagnosis not present

## 2022-07-02 DIAGNOSIS — M25551 Pain in right hip: Secondary | ICD-10-CM | POA: Diagnosis not present

## 2022-07-02 DIAGNOSIS — M25552 Pain in left hip: Secondary | ICD-10-CM | POA: Diagnosis not present

## 2022-07-02 DIAGNOSIS — M25512 Pain in left shoulder: Secondary | ICD-10-CM | POA: Diagnosis not present

## 2022-07-02 DIAGNOSIS — M25561 Pain in right knee: Secondary | ICD-10-CM | POA: Diagnosis not present

## 2022-07-04 ENCOUNTER — Ambulatory Visit
Admission: RE | Admit: 2022-07-04 | Discharge: 2022-07-04 | Disposition: A | Discharge status: Home / Self Care | Payer: Medicare Other | Source: Ambulatory Visit | Attending: Podiatry | Admitting: Podiatry

## 2022-07-04 DIAGNOSIS — M66871 Spontaneous rupture of other tendons, right ankle and foot: Secondary | ICD-10-CM

## 2022-07-04 DIAGNOSIS — Q666 Other congenital valgus deformities of feet: Secondary | ICD-10-CM | POA: Diagnosis not present

## 2022-07-04 DIAGNOSIS — M7731 Calcaneal spur, right foot: Secondary | ICD-10-CM | POA: Diagnosis not present

## 2022-07-04 DIAGNOSIS — S86811A Strain of other muscle(s) and tendon(s) at lower leg level, right leg, initial encounter: Secondary | ICD-10-CM | POA: Diagnosis not present

## 2022-07-08 ENCOUNTER — Telehealth: Payer: Self-pay | Admitting: Podiatry

## 2022-07-08 NOTE — Telephone Encounter (Signed)
-----   Message from Garrel Ridgel, [place] sent at 07/07/2022  3:36 PM EDT ----- Needs appt to discuss MRI and treatment

## 2022-07-08 NOTE — Telephone Encounter (Signed)
Lvm to cb to sched appt w/ [facility] to discuss MRI results & treatment options

## 2022-07-20 ENCOUNTER — Telehealth: Payer: Self-pay | Admitting: Internal Medicine

## 2022-07-20 DIAGNOSIS — E11649 Type 2 diabetes mellitus with hypoglycemia without coma: Secondary | ICD-10-CM

## 2022-07-20 MED ORDER — FREESTYLE LIBRE 2 READER DEVI
0 refills | Status: DC
Start: 2022-07-20 — End: 2023-05-02

## 2022-07-20 MED ORDER — FREESTYLE LIBRE 2 SENSOR MISC: 6 refills | Status: DC

## 2022-07-20 NOTE — Telephone Encounter (Signed)
[facility] is calling because pt wanted the [facility] [place] 2 sensor , but was sent the [employer] sensor. Pharmacy says they have faxed a prescription over to the office as well. Please advise.

## 2022-07-20 NOTE — Telephone Encounter (Signed)
Updated rx sent for the [facility] 2.

## 2022-07-22 ENCOUNTER — Ambulatory Visit: Payer: Medicare Other | Admitting: Podiatry

## 2022-07-22 ENCOUNTER — Encounter: Payer: Self-pay | Admitting: Podiatry

## 2022-07-22 DIAGNOSIS — M66871 Spontaneous rupture of other tendons, right ankle and foot: Secondary | ICD-10-CM

## 2022-07-22 NOTE — Progress Notes (Signed)
She presents with her mother today for follow-up of her MRI of her right rear foot.  She states that the foot is deformed and every time she stands on it it flattens down in the forefoot swings out to the right.  She states that every time she moves her ankle it feels like a knife is cutting into her bone in her anterior medial ankle as she points to the area.  She does relate relief with the use of the cam boot.  Objective: Vital signs are stable she is alert and oriented x 3.  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  Also demonstrating osteoarthritic changes at the talonavicular joint and a significant osteochondral defect defect to the anterior medial shoulder of the talar dome.  Assessment severe midfoot collapse arch collapse posterior tibial tendon failure with tear.  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  I explained to her that she will be nonweightbearing for period of 6 to 8 weeks utilizing a knee scooter and a set of crutches postoperatively.  She understands this and is amenable to it explained to her that this would most likely be done in an outpatient surgical center.  I went on to explain that I would like for her to be seen by Dr. Sharl Ma for surgical correction she has a consult with him scheduled within the next week or so.  I will be more than happy to assist in this case at her request.

## 2022-07-29 ENCOUNTER — Ambulatory Visit: Payer: Medicare Other | Admitting: Podiatry

## 2022-07-29 DIAGNOSIS — M76829 Posterior tibial tendinitis, unspecified leg: Secondary | ICD-10-CM

## 2022-07-29 DIAGNOSIS — M216X1 Other acquired deformities of right foot: Secondary | ICD-10-CM | POA: Diagnosis not present

## 2022-07-29 DIAGNOSIS — M958 Other specified acquired deformities of musculoskeletal system: Secondary | ICD-10-CM

## 2022-07-29 DIAGNOSIS — M66871 Spontaneous rupture of other tendons, right ankle and foot: Secondary | ICD-10-CM | POA: Diagnosis not present

## 2022-07-29 DIAGNOSIS — M19071 Primary osteoarthritis, right ankle and foot: Secondary | ICD-10-CM | POA: Diagnosis not present

## 2022-07-29 NOTE — Progress Notes (Signed)
Subjective:  Patient ID: [name], female    DOB: 1956-03-06,  MRN: [id number]  Chief Complaint  Patient presents with   Foot Pain    Patient came in today for right foot pain, patient is still having pain, rate of pain 4 out of 10, patient would like to talk about surgery today     67 y.o. female presents with the above complaint. History confirmed with patient.  She presents as a referral from Dr. Al Corpus for surgical consultation.  She had an MRI completed.  She says she has had worsening hindfoot pain ongoing gradually over the last couple years and has rapidly increased in the last few months.  Feels like her foot is collapsing turned out.  Starting to affect her knee and hip as well.  Objective:  Physical Exam: warm, good capillary refill, no trophic changes or ulcerative lesions, normal DP and PT pulses, normal sensory exam, and on the right foot she has severe collapsing pes planovalgus deformity with pain to palpation to the anterior calcaneal process in the CC joint, sinus tarsi which is edematous and painful, medial and lateral subtalar joint and anterior ankle joint especially medially at the medial gutter.  This is worse with dorsiflexion..  Radiographs taken 06/24/2022 show collapsing pes planovalgus of medial longitudinal arch and midfoot  MRI of the right ankle completed 07/07/2022 shows significant hindfoot arthritis and pes planovalgus deformity with tear of the posterior tibial tendon and insufficiency, she has subtalar cystic changes in the talus and calcaneus.  Degenerative changes in the CC joint.  She has a large unconstrained medial shoulder lesion of the talus Assessment:   1. Tibialis posterior tendon tear, nontraumatic, right   2. PTTD (posterior tibial tendon dysfunction)   3. Hindfoot pronation of right foot   4. Arthritis of right foot   5. Osteochondral defect of talus   6. Arthritis of right ankle      Plan:  Patient was evaluated and treated and all  questions answered.  Reviewed my clinical exam findings and her left foot radiographs and MRI of the ankle in detail together.  I gave her my impression and by independent review of the images as well as reviewing them with her in the room today.  We discussed the presence of multiple areas of arthritis including the subtalar joint, talonavicular joint, calcaneocuboid joint and a large OCD of the medial talus.  Unfortunately the medial talus injury does appear to be symptomatic.  I do not think that this can be ignored solely with triple arthrodesis.  I discussed with her that nonsurgically long-term in [place] AFO or Richie brace would be the only nonoperative option I would recommend.  This likely will be lifelong and will require medical pain management.  Surgically she requires either a pantalar arthrodesis or a double or triple arthrodesis as well as total ankle replacement.  I discussed with her the complexity of an ankle replacement such as this I would refer her to a specialist such as at [facility] or [facility] foot and ankle surgery.  I discussed with her I would be happy to proceed with TTC fusion with talonavicular fusion.  We discussed the risks and benefits of this including the limitation and loss of ankle range of motion.  She would like to avoid this.  She says she most notably would like to be able to drive with an up-and-down motion of the pedal does not think she would be able to do this with  an ankle fusion.  She also would like to have more flexibility of what types of shoes she can wear after this type of surgery.  Referral will be placed for orthopedic surgery evaluation due to core [facility], I discussed with her whoever can get her in the soonest I would recommend proceeding with surgery there.  We discussed this may require staged surgery with hindfoot arthrodesis and correction followed by ankle replacement and this would likely be a lengthy recovery with possible staged  surgeries.  A total of 35 minutes was spent today on the review of the patients medical record including previous laboratory values, imaging studies, taking of the history, examining the patient, the ordering of procedures/labs/tests/medications, coordination of care, and documentation in the chart.  Return if symptoms worsen or fail to improve.

## 2022-08-02 ENCOUNTER — Encounter (INDEPENDENT_AMBULATORY_CARE_PROVIDER_SITE_OTHER): Payer: Self-pay | Admitting: Family Medicine

## 2022-08-02 ENCOUNTER — Telehealth: Payer: Self-pay

## 2022-08-02 ENCOUNTER — Ambulatory Visit (INDEPENDENT_AMBULATORY_CARE_PROVIDER_SITE_OTHER): Payer: Medicare Other | Admitting: Family Medicine

## 2022-08-02 VITALS — BP 122/83 | HR 68 | Temp 97.6°F | Ht 66.0 in | Wt 209.0 lb

## 2022-08-02 DIAGNOSIS — M79671 Pain in right foot: Secondary | ICD-10-CM | POA: Diagnosis not present

## 2022-08-02 DIAGNOSIS — E669 Obesity, unspecified: Secondary | ICD-10-CM

## 2022-08-02 DIAGNOSIS — E11649 Type 2 diabetes mellitus with hypoglycemia without coma: Secondary | ICD-10-CM

## 2022-08-02 DIAGNOSIS — Z6833 Body mass index (BMI) 33.0-33.9, adult: Secondary | ICD-10-CM | POA: Diagnosis not present

## 2022-08-02 NOTE — Progress Notes (Signed)
Office: [phone]  /  Fax: [phone]  WEIGHT SUMMARY AND BIOMETRICS  Starting Date: 05/05/22  Starting Weight: 222lb   Weight Lost Since Last Visit: 4lb   Vitals Temp: 97.6 F (36.4 C) BP: 122/83 Pulse Rate: 68 SpO2: 99 %   Body Composition  Body Fat %: 43.1 % Fat Mass (lbs): 90 lbs Muscle Mass (lbs): 113 lbs Total Body Water (lbs): 81.2 lbs Visceral Fat Rating : 13    HPI  Chief Complaint: OBESITY  [name] is here to discuss her progress with her obesity treatment plan. She is on the the Category 1 Plan and states she is following her eating plan approximately 95 % of the time. She states she is exercising 0 minutes 0 times per week.   Interval History:  Since last office visit she is down 4 lb She is struggling with chronic R foot pain and she is being considered for an ankle replacement/ foot fusion Exercise has been limited She did some emotional eating She has a net weight loss of 13 lb in the past 3 mos Her mom has been supportive She is maintaining her muscle mass  Pharmacotherapy: none  PHYSICAL EXAM:  Blood pressure 122/83, pulse 68, temperature 97.6 F (36.4 C), height  (1.676 m), weight 209 lb (94.8 kg), SpO2 99 %. Body mass index is 33.73 kg/m.  General: She is overweight, cooperative, alert, well developed, and in no acute distress. PSYCH: Has normal mood, affect and thought process.   Lungs: Normal breathing effort, no conversational dyspnea.   ASSESSMENT AND PLAN  TREATMENT PLAN FOR OBESITY:  Recommended Dietary Goals  [name] is currently in the action stage of change. As such, her goal is to continue weight management plan. She has agreed to the Category 1 Plan.  Behavioral Intervention  We discussed the following Behavioral Modification Strategies today: increasing lean protein intake, decreasing simple carbohydrates , increasing vegetables, increasing lower glycemic fruits, increasing fiber rich foods, increasing  water intake, work on meal planning and preparation, emotional eating strategies and understanding the difference between hunger signals and cravings, work on managing stress, creating time for self-care and relaxation measures, continue to practice mindfulness when eating, and planning for success.  Additional resources provided today: NA  Recommended Physical Activity Goals  [name] has been advised to work up to 150 minutes of moderate intensity aerobic activity a week and strengthening exercises 2-3 times per week for cardiovascular health, weight loss maintenance and preservation of muscle mass.   She has agreed to Work on scheduling and tracking physical activity.   Chair exercises, bands, yoga  Pharmacotherapy changes for the treatment of obesity: None  ASSOCIATED CONDITIONS ADDRESSED TODAY  Type 2 diabetes mellitus with hypoglycemia without coma, without long-term current use of insulin Assessment & Plan: Patient has diet-controlled type 2 diabetes.  Her max A1c was 6.5 now down to 6.1.  She has declined use of metformin or GLP-1 receptor agonist.  She is doing quite well with medical weight management, dietary change but has limited exercise due to chronic foot pain.  Continue to work on weight reduction with a target BMI less than 30.  Will recheck her A1c in the next 2 months. Continue to follow a low sugar/low starch diet.  Lab Results  Component Value Date   HGBA1C 6.1 04/27/2022      Generalized obesity  BMI 33.0-33.9,adult  Right foot pain Assessment & Plan: Chronic.  Patient has been seen by a podiatrist looking at ankle replacement and  foot fusion surgery in the future.  Her mom is her support system.  Exercise has been limited due to right foot pain.  Keep upcoming visit with podiatry for scheduling surgery.  We discussed focusing on chair exercises, yoga and bands at home.       She was informed of the importance of frequent follow up visits to maximize her  success with intensive lifestyle modifications for her multiple health conditions.   ATTESTASTION STATEMENTS:  Reviewed by clinician on day of visit: allergies, medications, problem list, medical history, surgical history, family history, social history, and previous encounter notes pertinent to obesity diagnosis.   I have personally spent 30 minutes total time today in preparation, patient care, nutritional counseling and documentation for this visit, including the following: review of clinical lab tests; review of medical tests/procedures/services.      Glennis Brink, DO DABFM, DABOM [facility] [address] [place], [place] [address] [phone]

## 2022-08-02 NOTE — Assessment & Plan Note (Signed)
Chronic.  Patient has been seen by a podiatrist looking at ankle replacement and foot fusion surgery in the future.  Her mom is her support system.  Exercise has been limited due to right foot pain.  Keep upcoming visit with podiatry for scheduling surgery.  We discussed focusing on chair exercises, yoga and bands at home.

## 2022-08-02 NOTE — Assessment & Plan Note (Addendum)
Patient has diet-controlled type 2 diabetes.  Her max A1c was 6.5 now down to 6.1.  She has declined use of metformin or GLP-1 receptor agonist.  She is doing quite well with medical weight management, dietary change but has limited exercise due to chronic foot pain.  Continue to work on weight reduction with a target BMI less than 30.  Will recheck her A1c in the next 2 months. Continue to follow a low sugar/low starch diet.  Lab Results  Component Value Date   HGBA1C 6.1 04/27/2022

## 2022-08-02 NOTE — Telephone Encounter (Signed)
Referral, office notes and demographics faxed to [facility] [phone]  and faxed to [facility] [phone]

## 2022-08-03 DIAGNOSIS — M109 Gout, unspecified: Secondary | ICD-10-CM | POA: Diagnosis not present

## 2022-08-03 DIAGNOSIS — M2560 Stiffness of unspecified joint, not elsewhere classified: Secondary | ICD-10-CM | POA: Diagnosis not present

## 2022-08-03 DIAGNOSIS — M3505 Sjogren syndrome with inflammatory arthritis: Secondary | ICD-10-CM | POA: Diagnosis not present

## 2022-08-03 DIAGNOSIS — M1991 Primary osteoarthritis, unspecified site: Secondary | ICD-10-CM | POA: Diagnosis not present

## 2022-08-03 DIAGNOSIS — M2540 Effusion, unspecified joint: Secondary | ICD-10-CM | POA: Diagnosis not present

## 2022-08-04 DIAGNOSIS — R7303 Prediabetes: Secondary | ICD-10-CM | POA: Diagnosis not present

## 2022-08-04 DIAGNOSIS — M25571 Pain in right ankle and joints of right foot: Secondary | ICD-10-CM | POA: Diagnosis not present

## 2022-08-04 DIAGNOSIS — M79671 Pain in right foot: Secondary | ICD-10-CM | POA: Diagnosis not present

## 2022-08-18 ENCOUNTER — Encounter: Payer: Self-pay | Admitting: Internal Medicine

## 2022-08-30 ENCOUNTER — Encounter: Payer: Self-pay | Admitting: Internal Medicine

## 2022-08-30 ENCOUNTER — Encounter: Payer: Self-pay | Admitting: Pharmacist

## 2022-08-30 ENCOUNTER — Ambulatory Visit: Payer: Medicare Other | Attending: Internal Medicine | Admitting: Internal Medicine

## 2022-08-30 VITALS — BP 127/80 | HR 61 | Temp 98.2°F | Resp 16 | Wt 211.0 lb

## 2022-08-30 DIAGNOSIS — E1159 Type 2 diabetes mellitus with other circulatory complications: Secondary | ICD-10-CM | POA: Diagnosis not present

## 2022-08-30 DIAGNOSIS — E785 Hyperlipidemia, unspecified: Secondary | ICD-10-CM

## 2022-08-30 DIAGNOSIS — I152 Hypertension secondary to endocrine disorders: Secondary | ICD-10-CM | POA: Diagnosis not present

## 2022-08-30 DIAGNOSIS — M79671 Pain in right foot: Secondary | ICD-10-CM

## 2022-08-30 DIAGNOSIS — R252 Cramp and spasm: Secondary | ICD-10-CM

## 2022-08-30 DIAGNOSIS — K219 Gastro-esophageal reflux disease without esophagitis: Secondary | ICD-10-CM | POA: Diagnosis not present

## 2022-08-30 DIAGNOSIS — E1169 Type 2 diabetes mellitus with other specified complication: Secondary | ICD-10-CM

## 2022-08-30 DIAGNOSIS — E559 Vitamin D deficiency, unspecified: Secondary | ICD-10-CM

## 2022-08-30 DIAGNOSIS — E669 Obesity, unspecified: Secondary | ICD-10-CM

## 2022-08-30 DIAGNOSIS — F5104 Psychophysiologic insomnia: Secondary | ICD-10-CM

## 2022-08-30 LAB — POCT GLYCOSYLATED HEMOGLOBIN (HGB A1C): HbA1c, POC (prediabetic range): 5.9 % (ref 5.7–6.4)

## 2022-08-30 LAB — GLUCOSE, POCT (MANUAL RESULT ENTRY): POC Glucose: 115 mg/dl — AB (ref 70–99)

## 2022-08-30 MED ORDER — HYDROCHLOROTHIAZIDE 25 MG PO TABS: 25.0000 mg | ORAL_TABLET | Freq: Two times a day (BID) | ORAL | 2 refills | Status: DC

## 2022-08-30 MED ORDER — CYCLOBENZAPRINE HCL 10 MG PO TABS
10.0000 mg | ORAL_TABLET | Freq: Three times a day (TID) | ORAL | 3 refills | Status: DC | PRN
Start: 2022-08-30 — End: 2022-12-31

## 2022-08-30 MED ORDER — DEXLANSOPRAZOLE 60 MG PO CPDR
1.0000 | DELAYED_RELEASE_CAPSULE | Freq: Every morning | ORAL | 5 refills | Status: DC
Start: 2022-08-30 — End: 2023-05-02

## 2022-08-30 MED ORDER — ERGOCALCIFEROL 1.25 MG (50000 UT) PO CAPS
50000.0000 [IU] | ORAL_CAPSULE | ORAL | 1 refills | Status: DC
Start: 2022-08-30 — End: 2023-06-01

## 2022-08-30 MED ORDER — NEBIVOLOL HCL 5 MG PO TABS: 5.0000 mg | ORAL_TABLET | Freq: Every morning | ORAL | 3 refills | Status: DC

## 2022-08-30 NOTE — Progress Notes (Signed)
Patient ID: [name], female    DOB: 1956-04-12  MRN: [id number]  CC: Diabetes and Insomnia   Subjective: [name] is a 67 y.o. female who presents for chronic ds management Her concerns today include:  Patient with history of DM type II, HTN, HL, Sjogren syndrome, Raynaud's phenomenon, OA knees/ankles/shoulders/hips, HSV 2, asthma, gout, thyroid nodules followed by Dr. Gerrit Friends, genital herpes, GERD, vaginal wall prolapse, OAB    DM: Results for orders placed or performed in visit on 08/30/22  HgB A1c  Result Value Ref Range   Hemoglobin A1C     HbA1c POC (<> result, manual entry)     HbA1c, POC (prediabetic range) 5.9 5.7 - 6.4 %   HbA1c, POC (controlled diabetic range)    Glucose (CBG)  Result Value Ref Range   POC Glucose 115 (A) 70 - 99 mg/dl  Saw endocrine 07/979.  Told she did not have to come back but continue to follow with me BS have not been dropping through the nights any more Doing good with eating habits Not getting as much exercise due to RT foot pain.  She had recent MRI and had followed up with Dr. Teresita Madura.  Diagnosed with tibialis posterior tendon tear, arthritis of the right foot and ankle.  Will have surgery 10/07/2022.  Will be having it done by [facility] in [place], Dr. Hettie Holstein.   No problems with anesthesia in past.  She would like for me to fax the results of her A1c to [facility] Dr. Ihor Gully  HTN: Reports compliance with hydrochlorothiazide 25 mg twice a day and nebivolol 5 mg daily.  Checks BP occasionally Limits salt No CP/SOB.   Had a cold a few wks ago due to weather change.  Used Neb and was good after a few days   HL: Taking and tolerating atorvastatin 3 times a week.  C/O chronic insomnia.  Was on call every day on her last job.  Retired little over 1 yr.  Barely got in 5 hrs of sleep and less now that she is retired In bed 2:30 a.m.  Listens to music, reads, clean house until that time.  Wide away if she  tries to get in bed earlier.  Looks at clock every hr. Feels tired during the day.  If she takes nap she will be up all night Feels cold all the time.  Reports thyroid levels check in March 2024 with Dr. Sid Falcon; nl.   Tried Melatonin OTC in past and did not help  Requests refill on her muscle relaxant Flexeril, her PPI for GERD and vitamin D.  HM:  wants RSV vaccine; will get at her pharmacy.  Had 2nd shingles vaccine at [facility] [address] 2 wks ago  Patient Active Problem List   Diagnosis Date Noted   Dry mouth 06/24/2022   Joint swelling 06/24/2022   Other specified abnormal immunological findings in serum 06/24/2022   Pain in limb 06/24/2022   Insulin resistance 05/19/2022   Low mean corpuscular hemoglobin concentration (MCHC) 05/19/2022   Other fatigue 05/05/2022   SOBOE (shortness of breath on exertion) 05/05/2022   Right foot pain 05/05/2022   Depression screen 05/05/2022   Arthritis 03/23/2022   Hyperlipidemia associated with type 2 diabetes mellitus (HCC) 12/24/2021   Essential hypertension 10/21/2021   Type 2 diabetes mellitus with hypoglycemia without coma, without long-term current use of insulin (HCC) 10/21/2021   Primary osteoarthritis involving multiple joints 10/21/2021   Mild intermittent asthma without  complication 10/21/2021   History of herpes genitalis 10/21/2021   History of gout 10/21/2021   Gastroesophageal reflux disease without esophagitis 10/21/2021   Morbid obesity (HCC) 10/21/2021   Persistent cough 04/20/2021   Chronic vertigo 12/04/2020   COVID-19 04/16/2020   Neuropathic pain 04/12/2020   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   Gout 12/19/2019   Carotid artery stenosis 11/24/2019   Multiple joint pain 11/24/2019   Allergy to food 05/05/2018   Non-toxic nodular goiter 08/31/2017   Prediabetes 08/31/2017   Reflux    Asthma    Sjogren syndrome with inflammatory arthritis (HCC)      Current Outpatient Medications on File Prior to Visit   Medication Sig Dispense Refill   acetaminophen (TYLENOL) 650 MG CR tablet Take 650 mg by mouth as needed.       albuterol (VENTOLIN HFA) 108 (90 Base) MCG/ACT inhaler Inhale 2 puffs into the lungs every 6 (six) hours as needed for wheezing or shortness of breath. 8 g 5   allopurinol (ZYLOPRIM) 100 MG tablet Take 1 tablet (100 mg total) by mouth daily. 30 tablet 6   ascorbic acid (VITAMIN C) 500 MG tablet Take 500 mg by mouth daily.     atorvastatin (LIPITOR) 10 MG tablet 1 tab PO Q Mon/Wed/Frid 12 tablet 11   b complex vitamins capsule Take 1 capsule by mouth daily. 1 chewable once daily.     benzonatate (TESSALON) 200 MG capsule Take 1 capsule (200 mg total) by mouth 3 (three) times daily as needed for cough. 30 capsule 6   BIOTIN PO Take by mouth.     Calcium 500-2.5 MG-MCG CHEW Chew 2 tablets by mouth daily.     Cetirizine HCl (ZYRTEC PO) Take 10 mg by mouth.      Cholecalciferol (VITAMIN D PO) Take by mouth. TAKES 2000      Cinnamon 500 MG TABS Take 2 tablets by mouth daily.     Continuous Blood Gluc Receiver (FREESTYLE LIBRE 2 READER) DEVI Use to check blood sugar three times daily. Change sensors once every 14 days. E11.65 1 each 0   Continuous Blood Gluc Sensor (FREESTYLE LIBRE 2 SENSOR) MISC Use to check blood sugar three times daily. Change sensors once every 14 days. E11.65 2 each 6   cycloSPORINE (RESTASIS) 0.05 % ophthalmic emulsion 1 drop 2 (two) times daily.     diclofenac Sodium (VOLTAREN) 1 % GEL Apply 2 g topically daily. Once daily in the morning as needed.     EPINEPHrine 0.3 mg/0.3 mL IJ SOAJ injection Inject 0.3 mg into the muscle as needed for anaphylaxis. 1 each 1   Glucose 15 g PACK Take by mouth. Chew up to 4 tablets prn.     glucose blood (FREESTYLE LITE) test strip Use as instructed 100 each 12   hydroxychloroquine (PLAQUENIL) 200 MG tablet Take 2 tabs Orally daily for 90 days     ipratropium-albuterol (DUONEB) 0.5-2.5 (3) MG/3ML SOLN Take 3 mLs by nebulization every  6 (six) hours as needed. 100 mL 3   Lancets (FREESTYLE) lancets Use as instructed 100 each 12   meclizine (ANTIVERT) 25 MG tablet Take 1 tablet (25 mg total) by mouth 2 (two) times daily as needed for dizziness. 30 tablet 1   meloxicam (MOBIC) 15 MG tablet Take 1 tablet (15 mg total) by mouth daily. 30 tablet 5   montelukast (SINGULAIR) 10 MG tablet Take 1 tablet (10 mg total) by mouth at bedtime. 30 tablet 11  Multiple Vitamins-Iron (MULTIVITAMIN/IRON PO) Take by mouth.       polyethylene glycol (MIRALAX / GLYCOLAX) 17 g packet Take 17 g by mouth daily.     triamcinolone (NASACORT) 55 MCG/ACT nasal inhaler Place 2 sprays into the nose as needed.       valACYclovir (VALTREX) 500 MG tablet Take 1 tablet (500 mg total) by mouth daily. 30 tablet 11   vitamin E 180 MG (400 UNITS) capsule Take 400 Units by mouth in the morning and at bedtime.     No current facility-administered medications on file prior to visit.    Allergies  Allergen Reactions   Codeine Swelling   Duratuss [Phenylephrine-Guaifenesin] Hives   Erythromycin Swelling    ALLERGIC TO MYCINS   Hydrocodone-Acetaminophen Swelling   Iodine Swelling   Latex Swelling   Oxycodone Swelling   Peanuts [Nuts] Swelling   Penicillins Swelling   Poultry Meal Swelling   Shellfish Allergy Swelling   Vicodin [Hydrocodone-Acetaminophen] Swelling   Phentermine     Other Reaction(s): Not available    Social History   Socioeconomic History   Marital status: Single    Spouse name: Not on file   Number of children: Not on file   Years of education: Not on file   Highest education level: Bachelor's degree (e.g., BA, AB, BS)  Occupational History   Not on file  Tobacco Use   Smoking status: Former   Smokeless tobacco: Never  Vaping Use   Vaping Use: Never used  Substance and Sexual Activity   Alcohol use: Yes    Comment: Rare   Drug use: No   Sexual activity: Not Currently    Birth control/protection: Post-menopausal  Other  Topics Concern   Not on file  Social History Narrative   Not on file   Social Determinants of Health   Financial Resource Strain: Low Risk  (08/26/2022)   Overall Financial Resource Strain (CARDIA)    Difficulty of Paying Living Expenses: Not hard at all  Food Insecurity: No Food Insecurity (08/26/2022)   Hunger Vital Sign    Worried About Running Out of Food in the Last Year: Never true    Ran Out of Food in the Last Year: Never true  Transportation Needs: No Transportation Needs (08/26/2022)   PRAPARE - Administrator, Civil Service (Medical): No    Lack of Transportation (Non-Medical): No  Physical Activity: Unknown (08/26/2022)   Exercise Vital Sign    Days of Exercise per Week: 0 days    Minutes of Exercise per Session: Not on file  Stress: No Stress Concern Present (08/26/2022)   [employer] of Occupational Health - Occupational Stress Questionnaire    Feeling of Stress : Only a little  Social Connections: Moderately Integrated (08/26/2022)   Social Connection and Isolation Panel [NHANES]    Frequency of Communication with Friends and Family: More than three times a week    Frequency of Social Gatherings with Friends and Family: More than three times a week    Attends Religious Services: More than 4 times per year    Active Member of [employer] or Organizations: Yes    Attends Engineer, structural: More than 4 times per year    Marital Status: Divorced  Catering manager Violence: Not on file    Family History  Problem Relation Age of Onset   Hypertension Mother    Heart disease Mother    Arthritis Mother    Hyperlipidemia Mother    Stroke  Mother    Heart disease Father    Hypertension Father    Cancer Father        Prostate and lung   Alcohol abuse Father    Anxiety disorder Father    Cancer Paternal Grandmother        COLON CA   Breast cancer Maternal Aunt        LATE 50'S   Ovarian cancer Maternal Aunt     Past Surgical History:   Procedure Laterality Date   APPENDECTOMY     BACK SURGERY  03/12/2010   L5   BREAST SURGERY     BIOPSY   HERNIA REPAIR     groin bilateral   NECK SURGERY     PELVIC LAPAROSCOPY  [id number]   PILONIDAL CYST EXCISION     SALPINGOOPHORECTOMY  10/11/2002   SCOPE LSO-LEFT SEROUS CYSTADENOMA   SALPINGOOPHORECTOMY  04/12/2005   RSO/TOA   SHOULDER SURGERY Right 01/2018   TONSILLECTOMY AND ADENOIDECTOMY     TUBAL LIGATION     VAGINAL HYSTERECTOMY  01/10/2005   TVH, POSTERIOR REPAIR    ROS: Review of Systems Negative except as stated above  PHYSICAL EXAM: BP 127/80 (BP Location: Left Arm, Patient Position: Sitting, Cuff Size: Large)   Pulse 61   Temp 98.2 F (36.8 C) (Oral)   Resp 16   Wt 211 lb (95.7 kg)   SpO2 100%   BMI 34.06 kg/m   Physical Exam  General appearance - alert, well appearing, elderly African-American female and in no distress Mental status - normal mood, behavior, speech, dress, motor activity, and thought processes Chest - clear to auscultation, no wheezes, rales or rhonchi, symmetric air entry Heart - normal rate, regular rhythm, normal S1, S2, no murmurs, rubs, clicks or gallops Extremities -trace lower extremity edema.     08/30/2022   10:55 AM 04/27/2022   10:23 AM 01/29/2022   11:04 AM  Depression screen PHQ 2/9  Decreased Interest 0 0 0  Down, Depressed, Hopeless 0 0 1  PHQ - 2 Score 0 0 1  Altered sleeping 3 2 0  Tired, decreased energy 3 3 0  Change in appetite 0 0 0  Feeling bad or failure about yourself  0 0 0  Trouble concentrating 0 0 0  Moving slowly or fidgety/restless 0 0 0  Suicidal thoughts 0 0 0  PHQ-9 Score 6 5 1        Latest Ref Rng & Units 05/05/2022   10:08 AM 02/11/2022   10:43 AM 01/11/2022   10:48 AM  CMP  Glucose 70 - 99 mg/dL 90     BUN 8 - 27 mg/dL 24     Creatinine 9.81 - 1.00 mg/dL 1.91     Sodium 478 - 295 mmol/L 142     Potassium 3.5 - 5.2 mmol/L 4.1     Chloride 96 - 106 mmol/L 101     CO2 20 - 29 mmol/L  27     Calcium 8.7 - 10.3 mg/dL 9.2     Total Protein 6.0 - 8.5 g/dL 6.8  6.9  7.0   Total Bilirubin 0.0 - 1.2 mg/dL 0.3  0.4  0.3   Alkaline Phos 44 - 121 IU/L 115  114  126   AST 0 - 40 IU/L 24  27  47   ALT 0 - 32 IU/L 29  24  56    Lipid Panel     Component Value Date/Time   CHOL 156 01/11/2022  1048   TRIG 95 01/11/2022 1048   HDL 51 01/11/2022 1048   CHOLHDL 3.1 01/11/2022 1048   LDLCALC 87 01/11/2022 1048    CBC    Component Value Date/Time   WBC 3.6 05/05/2022 1008   WBC 6.1 03/18/2009 0555   RBC 5.12 05/05/2022 1008   RBC 4.43 03/18/2009 0555   HGB 13.0 05/05/2022 1008   HCT 41.7 05/05/2022 1008   PLT 188 05/05/2022 1008   MCV 81 05/05/2022 1008   MCH 25.4 (L) 05/05/2022 1008   MCHC 31.2 (L) 05/05/2022 1008   MCHC 33.2 03/18/2009 0555   RDW 13.2 05/05/2022 1008   LYMPHSABS 1.4 05/05/2022 1008   MONOABS 0.4 03/17/2009 1053   EOSABS 0.1 05/05/2022 1008   BASOSABS 0.0 05/05/2022 1008    ASSESSMENT AND PLAN:  1. Type 2 diabetes mellitus with obesity (HCC) At goal.  She is diet controlled.  Encouraged her to continue healthy eating habits. - HgB A1c - Glucose (CBG)  2. Hyperlipidemia associated with type 2 diabetes mellitus (HCC) Continue atorvastatin 3 days a week.  3. Hypertension associated with diabetes (HCC) At goal.  Continue HCTZ and Bystolic. - hydrochlorothiazide (HYDRODIURIL) 25 MG tablet; Take 1 tablet (25 mg total) by mouth 2 (two) times daily.  Dispense: 180 tablet; Refill: 2 - nebivolol (BYSTOLIC) 5 MG tablet; Take 1 tablet (5 mg total) by mouth every morning.  Dispense: 90 tablet; Refill: 3  4. Chronic insomnia Discussed good sleep hygiene.  I told patient that she may have to retrain her body and mind regarding sleep.  Advised that she starts trying to get in bed around 11 PM.  At least 1 hour before bedtime, she should start trying to wind down and avoid any activities like housecleaning that would be stimulating to the brain.  Try sleepy  time tea.  She denies trying any prescription medications to help with sleep at this time.  5. Muscle cramp - cyclobenzaprine (FLEXERIL) 10 MG tablet; Take 1 tablet (10 mg total) by mouth 3 (three) times daily as needed.  Dispense: 30 tablet; Refill: 3  6. Gastroesophageal reflux disease without esophagitis - dexlansoprazole (DEXILANT) 60 MG capsule; Take 1 capsule (60 mg total) by mouth every morning.  Dispense: 30 capsule; Refill: 5  7. Vitamin D deficiency - ergocalciferol (VITAMIN D2) 1.25 MG (50000 UT) capsule; Take 1 capsule (50,000 Units total) by mouth once a week.  Dispense: 12 capsule; Refill: 1  8. Right foot pain Patient is medically maximized for surgery.  She will call us back with the fax number for the orthopedic so that we can send the results of her A1c.     Patient was given the opportunity to ask questions.  Patient verbalized understanding of the plan and was able to repeat key elements of the plan.   This documentation was completed using Paediatric nurse.  Any transcriptional errors are unintentional.  Orders Placed This Encounter  Procedures   HgB A1c   Glucose (CBG)     Requested Prescriptions   Signed Prescriptions Disp Refills   cyclobenzaprine (FLEXERIL) 10 MG tablet 30 tablet 3    Sig: Take 1 tablet (10 mg total) by mouth 3 (three) times daily as needed.   dexlansoprazole (DEXILANT) 60 MG capsule 30 capsule 5    Sig: Take 1 capsule (60 mg total) by mouth every morning.   ergocalciferol (VITAMIN D2) 1.25 MG (50000 UT) capsule 12 capsule 1    Sig: Take 1 capsule (50,000 Units  total) by mouth once a week.   hydrochlorothiazide (HYDRODIURIL) 25 MG tablet 180 tablet 2    Sig: Take 1 tablet (25 mg total) by mouth 2 (two) times daily.   nebivolol (BYSTOLIC) 5 MG tablet 90 tablet 3    Sig: Take 1 tablet (5 mg total) by mouth every morning.    Return in about 4 months (around 12/31/2022).  Jonah Blue, MD, FACP

## 2022-08-30 NOTE — Progress Notes (Signed)
Discuss insomnia F/u DM Labs to go to surgeon  Would like RSV vaccine

## 2022-09-01 ENCOUNTER — Other Ambulatory Visit: Payer: Self-pay | Admitting: Pharmacist

## 2022-09-01 ENCOUNTER — Ambulatory Visit: Payer: Medicare Other | Attending: Internal Medicine | Admitting: Pharmacist

## 2022-09-01 DIAGNOSIS — Z79899 Other long term (current) drug therapy: Secondary | ICD-10-CM

## 2022-09-01 DIAGNOSIS — E1169 Type 2 diabetes mellitus with other specified complication: Secondary | ICD-10-CM

## 2022-09-01 DIAGNOSIS — G720 Drug-induced myopathy: Secondary | ICD-10-CM

## 2022-09-01 MED ORDER — ATORVASTATIN CALCIUM 10 MG PO TABS
ORAL_TABLET | ORAL | 2 refills | Status: DC
Start: 2022-09-01 — End: 2023-05-02

## 2022-09-01 MED ORDER — EPINEPHRINE 0.3 MG/0.3ML IJ SOAJ: 0.3000 mg | INTRAMUSCULAR | 1 refills | Status: AC | PRN

## 2022-09-01 NOTE — Progress Notes (Signed)
09/01/2022 Name: [name] MRN: [id number] DOB: 1956-03-10  No chief complaint on file.   [name] is a 67 y.o. year old female who presented for a telephone visit.   They were referred to the pharmacist by a quality report for assistance in managing medication access.   Patient is participating in a Managed Medicaid Plan: no  Subjective:  Care Team: Primary Care Provider: Marcine Matar, MD ; Next Scheduled Visit: 12/31/2022  Medication Access/Adherence  Current Pharmacy:  [facility] [address] [place] - [address] [place] [address] Phone: [phone] Fax: [phone]   Patient reports affordability concerns with their medications: No  Patient reports access/transportation concerns to their pharmacy: No  Patient reports adherence concerns with their medications:  No     Medication Management:  Current adherence strategy: sufficient - she does want to fill 90-day supply of her statin.  Patient reports Good adherence to medications  Patient reports the following barriers to adherence: none  Objective:  Lab Results  Component Value Date   HGBA1C 5.9 08/30/2022    Lab Results  Component Value Date   CREATININE 0.89 05/05/2022   BUN 24 05/05/2022   NA 142 05/05/2022   K 4.1 05/05/2022   CL 101 05/05/2022   CO2 27 05/05/2022    Lab Results  Component Value Date   CHOL 156 01/11/2022   HDL 51 01/11/2022   LDLCALC 87 01/11/2022   TRIG 95 01/11/2022   CHOLHDL 3.1 01/11/2022    Medications Reviewed Today     Reviewed by Marcine Matar, MD (Physician) on 08/30/22 at 1255  Med List Status: <None>   Medication Order Taking? Sig Documenting Provider Last Dose Status Informant  acetaminophen (TYLENOL) 650 MG CR tablet [id number] Yes Take 650 mg by mouth as needed.   [provider] Taking Active            Med Note Lois Huxley, Cornelius Moras   Fri Jan 29, 2022 10:48 AM) Prn 1-2x monthly   albuterol (VENTOLIN HFA) 108 (90 Base) MCG/ACT inhaler [id number] Yes Inhale 2 puffs into the lungs every 6 (six) hours as needed for wheezing or shortness of breath. Marcine Matar, MD Taking Active            Med Note Zenaida Niece Allyson Sabal   Fri Jan 29, 2022 10:48 AM) prn  allopurinol (ZYLOPRIM) 100 MG tablet [id number] Yes Take 1 tablet (100 mg total) by mouth daily. Marcine Matar, MD Taking Active   ascorbic acid (VITAMIN C) 500 MG tablet [id number] Yes Take 500 mg by mouth daily. [provider] Taking Active   atorvastatin (LIPITOR) 10 MG tablet [id number] Yes 1 tab PO Q Mon/Wed/Frid Marcine Matar, MD Taking Active   b complex vitamins capsule [id number] Yes Take 1 capsule by mouth daily. 1 chewable once daily. [provider] Taking Active   benzonatate (TESSALON) 200 MG capsule [id number] Yes Take 1 capsule (200 mg total) by mouth 3 (three) times daily as needed for cough. Marcine Matar, MD Taking Active   BIOTIN PO [id number] Yes Take by mouth. [provider] Taking Active   Calcium 500-2.5 MG-MCG CHEW [id number] Yes Chew 2 tablets by mouth daily. [provider] Taking Active   Cetirizine HCl (ZYRTEC PO) [id number] Yes Take 10 mg by mouth.  [provider] Taking Active   Cholecalciferol (VITAMIN D PO) [id number] Yes Take by mouth. TAKES 2000  [provider] Taking Active   Cinnamon 500 MG TABS [id number] Yes Take 2 tablets by mouth daily. [provider] Taking Active   Continuous Blood Gluc Receiver (FREESTYLE LIBRE 2 READER) DEVI [id number] Yes Use to check blood sugar three times daily. Change sensors once every 14 days. E11.65 Marcine Matar, MD Taking Active   Continuous Blood Gluc Sensor (FREESTYLE LIBRE 2 SENSOR) [place] [id number] Yes Use to check blood sugar three times daily. Change sensors once every 14 days. E11.65 Marcine Matar, MD Taking Active   cyclobenzaprine (FLEXERIL) 10 MG tablet [id number]   Take 1 tablet (10 mg total) by mouth 3 (three) times daily as needed. Marcine Matar, MD  Active   cycloSPORINE (RESTASIS) 0.05 % ophthalmic emulsion [id number] Yes 1 drop 2 (two) times daily. [provider] Taking Active   dexlansoprazole (DEXILANT) 60 MG capsule [id number]  Take 1 capsule (60 mg total) by mouth every morning. Marcine Matar, MD  Active   diclofenac Sodium (VOLTAREN) 1 % GEL [id number] Yes Apply 2 g topically daily. Once daily in the morning as needed. [provider] Taking Active Self  EPINEPHrine 0.3 mg/0.3 mL IJ SOAJ injection [id number] Yes Inject 0.3 mg into the muscle as needed for anaphylaxis. Marcine Matar, MD Taking Active   ergocalciferol (VITAMIN D2) 1.25 MG (50000 UT) capsule [id number]  Take 1 capsule (50,000 Units total) by mouth once a week. Marcine Matar, MD  Active   Glucose 15 g PACK [id number] Yes Take by mouth. Chew up to 4 tablets prn. [provider] Taking Active Self  glucose blood (FREESTYLE LITE) test strip [id number] Yes Use as instructed Marcine Matar, MD Taking Active   hydrochlorothiazide (HYDRODIURIL) 25 MG tablet [id number]  Take 1 tablet (25 mg total) by mouth 2 (two) times daily. Marcine Matar, MD  Active   hydroxychloroquine (PLAQUENIL) 200 MG tablet [id number] Yes Take 2 tabs Orally daily for 90 days [provider] Taking Active   ipratropium-albuterol (DUONEB) 0.5-2.5 (3) MG/3ML SOLN [id number] Yes Take 3 mLs by nebulization every 6 (six) hours as needed. Marcine Matar, MD Taking Active   Lancets (FREESTYLE) lancets [id number] Yes Use as instructed Marcine Matar, MD Taking Active   meclizine (ANTIVERT) 25 MG tablet [id number] Yes Take 1 tablet (25 mg total) by mouth 2 (two) times daily as needed for dizziness. Marcine Matar, MD Taking Active   meloxicam [facility]) 15 MG tablet [id number] Yes Take 1 tablet (15 mg total) by mouth daily. Marcine Matar, MD Taking Active             Med Note Zenaida Niece Allyson Sabal   Fri Jan 29, 2022 10:52 AM) Sees her RA next week (10/26)  montelukast (SINGULAIR) 10 MG tablet [id number] Yes Take 1 tablet (10 mg total) by mouth at bedtime. Marcine Matar, MD Taking Active   Multiple Vitamins-Iron (MULTIVITAMIN/IRON PO) [id number] Yes Take by mouth.   [provider] Taking Active   nebivolol (BYSTOLIC) 5 MG tablet [id number]  Take 1 tablet (5 mg total) by mouth every morning. Marcine Matar, MD  Active   polyethylene glycol (MIRALAX / GLYCOLAX) 17 g packet [id number] Yes Take 17 g by mouth daily. [provider] Taking Active Self  triamcinolone (NASACORT) 55 MCG/ACT nasal inhaler [id number] Yes Place 2 sprays into the nose as needed.   [provider] Taking Active   valACYclovir (VALTREX) 500 MG tablet [id number] Yes Take 1 tablet (500 mg  total) by mouth daily. Marcine Matar, MD Taking Active   vitamin E 180 MG (400 UNITS) capsule [id number] Yes Take 400 Units by mouth in the morning and at bedtime. [provider] Taking Active               Assessment/Plan:   Medication Management: - Currently strategy sufficient to maintain appropriate adherence to prescribed medication regimen. Rxn for atorvastatin sent for 90-day supply.  - Suggested use of weekly pill box to organize medications - Created list of medication, indication, and administration time. Provided to patient    Follow Up Plan: in Sept with PCP  Butch Penny, PharmD, BCACP, CPP Clinical Pharmacist [facility] [phone]

## 2022-09-02 ENCOUNTER — Ambulatory Visit (INDEPENDENT_AMBULATORY_CARE_PROVIDER_SITE_OTHER): Payer: Medicare Other | Admitting: Family Medicine

## 2022-09-22 ENCOUNTER — Ambulatory Visit (INDEPENDENT_AMBULATORY_CARE_PROVIDER_SITE_OTHER): Payer: Medicare Other

## 2022-09-22 ENCOUNTER — Ambulatory Visit
Admission: RE | Admit: 2022-09-22 | Discharge: 2022-09-22 | Disposition: A | Discharge status: Home / Self Care | Payer: Medicare Other | Source: Ambulatory Visit | Attending: Emergency Medicine | Admitting: Emergency Medicine

## 2022-09-22 ENCOUNTER — Ambulatory Visit: Payer: Self-pay | Admitting: *Deleted

## 2022-09-22 VITALS — BP 106/72 | HR 60 | Temp 98.1°F | Resp 16 | Ht 66.0 in | Wt 211.0 lb

## 2022-09-22 DIAGNOSIS — M4126 Other idiopathic scoliosis, lumbar region: Secondary | ICD-10-CM | POA: Diagnosis not present

## 2022-09-22 DIAGNOSIS — M545 Low back pain, unspecified: Secondary | ICD-10-CM

## 2022-09-22 DIAGNOSIS — W19XXXA Unspecified fall, initial encounter: Secondary | ICD-10-CM | POA: Diagnosis not present

## 2022-09-22 DIAGNOSIS — M546 Pain in thoracic spine: Secondary | ICD-10-CM | POA: Diagnosis not present

## 2022-09-22 DIAGNOSIS — M5136 Other intervertebral disc degeneration, lumbar region: Secondary | ICD-10-CM | POA: Diagnosis not present

## 2022-09-22 NOTE — Telephone Encounter (Signed)
Reason for Disposition  [1] MODERATE weakness (i.e., interferes with work, school, normal activities) AND [2] new-onset or worsening    Chair broke.  Fell on buttocks and left hip.  Having a lot pain getting worse in her back and left hip.   Left arm was hurting but ok now.  Answer Assessment - Initial Assessment Questions 1. MECHANISM: "How did the fall happen?"     I fell 2 weeks ago.  A chair broke and I fell on my behind and left hip and left arm.   My left hip and mid back down to lower back are really hurting.   I've had surgery on L5 in the past.    My back is really hurting.   Ice is not helping.    I'm for ankle surgery on 10/07/2022.    I'm falling apart.    I can walk but I'm hurting.   I can't bend over.   It hurts so bad.  Getting out of bed really hurts.     2. DOMESTIC VIOLENCE AND ELDER ABUSE SCREENING: "Did you fall because someone pushed you or tried to hurt you?" If Yes, ask: "Are you safe now?"     N/A 3. ONSET: "When did the fall happen?" (e.g., minutes, hours, or days ago)     2 weeks ago     4. LOCATION: "What part of the body hit the ground?" (e.g., back, buttocks, head, hips, knees, hands, head, stomach)     Left hip, buttocks and left arm.   Back of chair hit me in the head.    5. INJURY: "Did you hurt (injure) yourself when you fell?" If Yes, ask: "What did you injure? Tell me more about this?" (e.g., body area; type of injury; pain severity)"     See above   No head injury from where chair hit her. 6. PAIN: "Is there any pain?" If Yes, ask: "How bad is the pain?" (e.g., Scale 1-10; or mild,  moderate, severe)   - NONE (0): No pain   - MILD (1-3): Doesn't interfere with normal activities    - MODERATE (4-7): Interferes with normal activities or awakens from sleep    - SEVERE (8-10): Excruciating pain, unable to do any normal activities      My pain is getting worse.   7. SIZE: For cuts, bruises, or swelling, ask: "How large is it?" (e.g., inches or centimeters)       No cuts or bruising 8. PREGNANCY: "Is there any chance you are pregnant?" "When was your last menstrual period?"     N/A due to age 67. OTHER SYMPTOMS: "Do you have any other symptoms?" (e.g., dizziness, fever, weakness; new onset or worsening).      No   Just a lot of back pain and left hip pain.   10. CAUSE: "What do you think caused the fall (or falling)?" (e.g., tripped, dizzy spell)       The chair fell apart.  Protocols used: Falls and [facility]

## 2022-09-22 NOTE — Telephone Encounter (Signed)
  Chief Complaint: 2 weeks ago a chair fell apart and she fell onto her buttocks hurting her left hip and back from mid back all the way down.  Had back surgery in past so is concerned about her back and the pain is getting worse.  She is for ankle surgery on 10/07/2022.    Symptoms: Pain getting worse in her left hip and especially her back.   She can walk but it's very painful.   Ice not helping.   "I thought I would get better".   Did not go to the ED.   Hurt left arm too but "It's ok now".   Frequency: Happened 2 weeks ago Pertinent Negatives: Patient denies any medical attention since being injured Disposition: [] ED /[x] Urgent Care (no appt availability in office) / [] Appointment(In office/virtual)/ []  [facility]/ [] Home Care/ [] Refused Recommended Disposition /[] [facility]/ []  Follow-up with PCP Additional Notes: referred to the urgent care since there are no appts available at [facility] until July 2024.   She also needs x rays done and has ankle surgery coming up so she was wanting to know if anything was fractured or broken before having surgery.   She may have to cancel the ankle surgery.   She is going to the urgent care after her 11:00 appt. In [place].

## 2022-09-22 NOTE — ED Triage Notes (Signed)
Patient here today with c/o LB pain and left hip after falling when she went to sitting in a chair and it collapsing on 08/31/2022. She states that she hit the floor really hard and has been having pain since. She has been taking Tylenol but not helping. She has right foot surgery scheduled on 10/07/2022. She cannot get in a comfortable position.

## 2022-09-22 NOTE — Discharge Instructions (Signed)
You will get a call if are abnormal, you will not get a call if results are normal but you can check results in MyChart if you have a MyChart account.

## 2022-09-23 NOTE — ED Provider Notes (Signed)
[facility]    CSN: [id number] Arrival date & time: 09/22/22  1331      History   Chief Complaint Chief Complaint  Patient presents with   Fall    I fell two weeks ago when a chair collapsed. I cell hard on my buttocks and onto my left hip. I had no bruises. I had some swelling in my back so I've been putting I e packs on my hip and back and taking Tylenol. I'm hurting in my back and left hip. - Entered by patient    HPI [name] is a 67 y.o. female. Patient here today with c/o LB pain and left hip after falling when she went to sitting in a chair and it collapsing on 08/31/2022. She states that she hit the floor really hard in her tailbone area and has been having pain since and her middle back and low back in the center/spine area. She has been taking Tylenol but not helping. She has right foot surgery scheduled on 10/07/2022. She cannot get in a comfortable position.   Denies numbness or tingling.  Denies weakness in lower extremities.  Denies change in bowel or bladder habits.   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   HTN (hypertension)    Joint pain    Lactose intolerance    Multiple food allergies    OA (osteoarthritis)    Reflux    Sjogren's syndrome (HCC)    SOB (shortness of breath)    Swallowing difficulty    Thyroid condition    Vitamin D deficiency     Patient Active Problem List   Diagnosis Date Noted   Dry mouth 06/24/2022   Joint swelling 06/24/2022   Other specified abnormal immunological findings in serum 06/24/2022   Pain in limb 06/24/2022   Insulin resistance 05/19/2022   Low mean corpuscular hemoglobin concentration (MCHC) 05/19/2022   Other fatigue 05/05/2022   SOBOE (shortness of breath on exertion) 05/05/2022   Right foot pain  05/05/2022   Depression screen 05/05/2022   Arthritis 03/23/2022   Hyperlipidemia associated with type 2 diabetes mellitus (HCC) 12/24/2021   Essential hypertension 10/21/2021   Type 2 diabetes mellitus with hypoglycemia without coma, without long-term current use of insulin (HCC) 10/21/2021   Primary osteoarthritis involving multiple joints 10/21/2021   Mild intermittent asthma without complication 10/21/2021   History of herpes genitalis 10/21/2021   History of gout 10/21/2021   Gastroesophageal reflux disease without esophagitis 10/21/2021   Morbid obesity (HCC) 10/21/2021   Persistent cough 04/20/2021   Chronic vertigo 12/04/2020   COVID-19 04/16/2020   Neuropathic pain 04/12/2020   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   Gout 12/19/2019   Carotid artery stenosis 11/24/2019   Multiple joint pain 11/24/2019   Allergy to food 05/05/2018   Non-toxic nodular goiter 08/31/2017   Prediabetes 08/31/2017   Reflux    Asthma    Sjogren syndrome with inflammatory arthritis [facility])     Past Surgical History:  Procedure Laterality Date   APPENDECTOMY     BACK SURGERY  03/12/2010   L5   BREAST SURGERY     BIOPSY   HERNIA REPAIR     groin  bilateral   NECK SURGERY     PELVIC LAPAROSCOPY  [id number]   PILONIDAL CYST EXCISION     SALPINGOOPHORECTOMY  10/11/2002   SCOPE LSO-LEFT SEROUS CYSTADENOMA   SALPINGOOPHORECTOMY  04/12/2005   RSO/TOA   SHOULDER SURGERY Right 01/2018   TONSILLECTOMY AND ADENOIDECTOMY     TUBAL LIGATION     VAGINAL HYSTERECTOMY  01/10/2005   TVH, POSTERIOR REPAIR    OB History     Gravida  5   Para  2   Term      Preterm      AB  3   Living  2      SAB  3   IAB      Ectopic      Multiple      Live Births  2            Home Medications    Prior to Admission medications   Medication Sig Start Date End Date Taking? Authorizing Provider  acetaminophen (TYLENOL) 650 MG CR tablet Take 650 mg by mouth as needed.     Yes  [provider]  albuterol (VENTOLIN HFA) 108 (90 Base) MCG/ACT inhaler Inhale 2 puffs into the lungs every 6 (six) hours as needed for wheezing or shortness of breath. 10/20/21  Yes Marcine Matar, MD  ascorbic acid (VITAMIN C) 500 MG tablet Take 500 mg by mouth daily.   Yes [provider]  atorvastatin (LIPITOR) 10 MG tablet 1 tab PO Q Mon/Wed/Frid 09/01/22  Yes Marcine Matar, MD  b complex vitamins capsule Take 1 capsule by mouth daily. 1 chewable once daily.   Yes [provider]  BIOTIN PO Take by mouth.   Yes [provider]  Calcium 500-2.5 MG-MCG CHEW Chew 2 tablets by mouth daily.   Yes [provider]  Cetirizine HCl (ZYRTEC PO) Take 10 mg by mouth.    Yes [provider]  Cholecalciferol (VITAMIN D PO) Take by mouth. TAKES 2000    Yes [provider]  Cinnamon 500 MG TABS Take 2 tablets by mouth daily.   Yes [provider]  cyclobenzaprine (FLEXERIL) 10 MG tablet Take 1 tablet (10 mg total) by mouth 3 (three) times daily as needed. 08/30/22  Yes Marcine Matar, MD  dexlansoprazole (DEXILANT) 60 MG capsule Take 1 capsule (60 mg total) by mouth every morning. 08/30/22  Yes Marcine Matar, MD  diclofenac Sodium (VOLTAREN) 1 % GEL Apply 2 g topically daily. Once daily in the morning as needed.   Yes [provider]  ergocalciferol (VITAMIN D2) 1.25 MG (50000 UT) capsule Take 1 capsule (50,000 Units total) by mouth once a week. 08/30/22  Yes Marcine Matar, MD  hydrochlorothiazide (HYDRODIURIL) 25 MG tablet Take 1 tablet (25 mg total) by mouth 2 (two) times daily. 08/30/22  Yes Marcine Matar, MD  hydroxychloroquine (PLAQUENIL) 200 MG tablet Take 2 tabs Orally daily for 90 days 02/01/22  Yes [provider]  meloxicam (MOBIC) 15 MG tablet Take 1 tablet (15 mg total) by mouth daily. 10/20/21  Yes Marcine Matar, MD  montelukast (SINGULAIR) 10 MG tablet Take 1 tablet (10 mg total) by  mouth at bedtime. 04/27/22  Yes Marcine Matar, MD  Multiple Vitamins-Iron (MULTIVITAMIN/IRON PO) Take by mouth.     Yes [provider]  nebivolol (BYSTOLIC) 5 MG tablet Take 1 tablet (5 mg total) by mouth every morning. 08/30/22  Yes Marcine Matar, MD  polyethylene glycol (MIRALAX / GLYCOLAX) 17 g packet Take 17 g by mouth daily.   Yes [provider]  triamcinolone (NASACORT) 55 MCG/ACT nasal inhaler Place 2 sprays into the nose as needed.     Yes [provider]  valACYclovir (VALTREX) 500 MG tablet Take 1 tablet (500 mg total) by mouth daily. 04/27/22  Yes Marcine Matar, MD  vitamin E 180 MG (400 UNITS) capsule Take 400 Units by mouth in the morning and at bedtime.   Yes [provider]  allopurinol (ZYLOPRIM) 100 MG tablet Take 1 tablet (100 mg total) by mouth daily. 10/20/21   Marcine Matar, MD  benzonatate (TESSALON) 200 MG capsule Take 1 capsule (200 mg total) by mouth 3 (three) times daily as needed for cough. 04/27/22   Marcine Matar, MD  Continuous Blood Gluc Receiver (FREESTYLE LIBRE 2 READER) DEVI Use to check blood sugar three times daily. Change sensors once every 14 days. E11.65 07/20/22   Marcine Matar, MD  Continuous Blood Gluc Sensor (FREESTYLE LIBRE 2 SENSOR) MISC Use to check blood sugar three times daily. Change sensors once every 14 days. E11.65 07/20/22   Marcine Matar, MD  cycloSPORINE (RESTASIS) 0.05 % ophthalmic emulsion 1 drop 2 (two) times daily.    [provider]  EPINEPHrine 0.3 mg/0.3 mL IJ SOAJ injection Inject 0.3 mg into the muscle as needed for anaphylaxis. 09/01/22   Marcine Matar, MD  Glucose 15 g PACK Take by mouth. Chew up to 4 tablets prn.    [provider]  glucose blood (FREESTYLE LITE) test strip Use as instructed 04/27/22   Marcine Matar, MD  ipratropium-albuterol (DUONEB) 0.5-2.5 (3) MG/3ML SOLN Take 3 mLs by nebulization every 6 (six) hours as needed. 10/20/21    Marcine Matar, MD  Lancets (FREESTYLE) lancets Use as instructed 04/27/22   Marcine Matar, MD  meclizine (ANTIVERT) 25 MG tablet Take 1 tablet (25 mg total) by mouth 2 (two) times daily as needed for dizziness. 12/24/21   Marcine Matar, MD    Family History Family History  Problem Relation Age of Onset   Hypertension Mother    Heart disease Mother    Arthritis Mother    Hyperlipidemia Mother    Stroke Mother    Heart disease Father    Hypertension Father    Cancer Father        Prostate and lung   Alcohol abuse Father    Anxiety disorder Father    Cancer Paternal Grandmother        COLON CA   Breast cancer Maternal Aunt        LATE 50'S   Ovarian cancer Maternal Aunt     Social History Social History   Tobacco Use   Smoking status: Former   Smokeless tobacco: Never  Building services engineer Use: Never used  Substance Use Topics   Alcohol use: Yes    Comment: Rare   Drug use: No     Allergies   Codeine, Duratuss [phenylephrine-guaifenesin], Erythromycin, Hydrocodone-acetaminophen, Iodine, Latex, Oxycodone, Peanuts [nuts], Penicillins, Poultry meal, Shellfish allergy, Vicodin [hydrocodone-acetaminophen], Augmentin [amoxicillin-pot clavulanate], and Phentermine   Review of Systems Review of Systems   Physical Exam Triage Vital Signs ED Triage Vitals  Enc Vitals Group     BP 09/22/22 1406 106/72     Pulse Rate 09/22/22 1406 60     Resp 09/22/22 1406 16     Temp 09/22/22 1406 98.1  F (36.7 C)     Temp Source 09/22/22 1406 Oral     SpO2 09/22/22 1406 97 %     Weight 09/22/22 1406 211 lb (95.7 kg)     Height 09/22/22 1406 5\' 6"  (1.676 m)     Head Circumference --      Peak Flow --      Pain Score 09/22/22 1405 6     Pain Loc --      Pain Edu? --      Excl. in GC? --    No data found.  Updated Vital Signs BP 106/72 (BP Location: Right Arm)   Pulse 60   Temp 98.1 F (36.7 C) (Oral)   Resp 16   Ht 5\' 6"  (1.676 m)   Wt 211 lb (95.7 kg)    SpO2 97%   BMI 34.06 kg/m   Visual Acuity Right Eye Distance:   Left Eye Distance:   Bilateral Distance:    Right Eye Near:   Left Eye Near:    Bilateral Near:     Physical Exam Constitutional:      General: She is not in acute distress.    Appearance: Normal appearance.  Pulmonary:     Effort: Pulmonary effort is normal.  Musculoskeletal:     Cervical back: Normal.     Thoracic back: Tenderness present.     Lumbar back: Tenderness and bony tenderness present.  Neurological:     Mental Status: She is alert.      UC Treatments / Results  Labs (all labs ordered are listed, but only abnormal results are displayed) Labs Reviewed - No data to display  EKG   Radiology DG Thoracic Spine 2 View  Result Date: 09/22/2022 CLINICAL DATA:  Back pain after a fall on 08/31/2022 EXAM: THORACIC SPINE 2 VIEWS COMPARISON:  Two-view chest 08/19/2011 FINDINGS: Lower thoracic scoliosis convex towards the left. No anterior subluxations. Diffuse degenerative changes with narrowed interspaces and endplate osteophyte formation. No vertebral compression deformities. No focal bone lesion or bone destruction. No abnormal paraspinal soft tissue swelling. Postoperative changes in the cervical spine. IMPRESSION: Thoracic scoliosis convex towards the left. Diffuse degenerative change. No acute bony abnormalities identified. Electronically Signed   By: Burman Nieves M.D.   On: 09/22/2022 15:15   DG Lumbar Spine Complete  Result Date: 09/22/2022 CLINICAL DATA:  Lumbar and thoracic pain since a fall on 08/31/2022. EXAM: LUMBAR SPINE - COMPLETE 4+ VIEW COMPARISON:  02/20/2010 FINDINGS: Five lumbar type vertebral bodies. Degenerative changes throughout the lumbar spine with narrowed interspaces and endplate osteophyte formation. No anterior subluxations. Lumbar scoliosis convex towards the right. No vertebral compression deformities. No focal bone lesion or bone destruction. Sacrum appears intact.  IMPRESSION: Degenerative changes in the lumbar spine. Scoliosis convex towards the right. No acute displaced fractures identified. Electronically Signed   By: Burman Nieves M.D.   On: 09/22/2022 15:14    Procedures Procedures (including critical care time)  Medications Ordered in UC Medications - No data to display  Initial Impression / Assessment and Plan / UC Course  I have reviewed the triage vital signs and the nursing notes.  Pertinent labs & imaging results that were available during my care of the patient were reviewed by me and considered in my medical decision making (see chart for details).    Patient with scoliosis.  No fracture on x-ray.  Has not sought care for her scoliosis in a number of years.  Agrees to seek follow-up for after  consulting with her PCP.  After x-ray and after discussion, patient reports she always has back pain related to her scoliosis but that it has been worse lately since her fall.  She is relieved to find out that there is no fracture  Final Clinical Impressions(s) / UC Diagnoses   Final diagnoses:  Acute midline low back pain without sciatica  Fall, initial encounter     Discharge Instructions      You will get a call if are abnormal, you will not get a call if results are normal but you can check results in MyChart if you have a MyChart account.      ED Prescriptions   None    PDMP not reviewed this encounter.   Cathlyn Parsons, NP 09/23/22 [phone]

## 2022-09-30 ENCOUNTER — Ambulatory Visit (INDEPENDENT_AMBULATORY_CARE_PROVIDER_SITE_OTHER): Payer: Medicare Other | Admitting: Family Medicine

## 2022-09-30 ENCOUNTER — Encounter (INDEPENDENT_AMBULATORY_CARE_PROVIDER_SITE_OTHER): Payer: Self-pay | Admitting: Family Medicine

## 2022-09-30 VITALS — BP 110/65 | HR 59 | Temp 98.1°F | Ht 66.0 in | Wt 205.0 lb

## 2022-09-30 DIAGNOSIS — Z6833 Body mass index (BMI) 33.0-33.9, adult: Secondary | ICD-10-CM

## 2022-09-30 DIAGNOSIS — E11649 Type 2 diabetes mellitus with hypoglycemia without coma: Secondary | ICD-10-CM | POA: Diagnosis not present

## 2022-09-30 DIAGNOSIS — M79671 Pain in right foot: Secondary | ICD-10-CM

## 2022-09-30 NOTE — Progress Notes (Signed)
Office: [phone]  /  Fax: [phone]  WEIGHT SUMMARY AND BIOMETRICS  Starting Date: 05/05/22  Starting Weight: 222lb   Weight Lost Since Last Visit: 4lb   Vitals Temp: 98.1 F (36.7 C) BP: 110/65 Pulse Rate: (!) 59 SpO2: 99 %   Body Composition  Body Fat %: 39.8 % Fat Mass (lbs): 81.6 lbs Muscle Mass (lbs): 117.4 lbs Total Body Water (lbs): 84 lbs Visceral Fat Rating : 12   HPI  Chief Complaint: OBESITY  [name] is here to discuss her progress with her obesity treatment plan. She is on the the Category 1 Plan and states she is following her eating plan approximately 85 % of the time. She states she is exercising 0 minutes 0 times per week.   Interval History:  Since last office visit she is down 4 lb She has a net weight loss of 17 pounds in the past 5 months. This is a 7.6% total body weight loss in the past 5 months without use of antiobesity medications She is going to have surgery next week on her R foot Exercise is really limited due to pain.  Doing some basic supine exercises She is making healthy food choices She has been through some celebration She is up 4.4 lb of muscle mass and is down 8.6 lb of body fat in the past 2 mos She is feeling better and clothes  with a good energy level.  Mediterranean diet handout given    Pharmacotherapy: None  PHYSICAL EXAM:  Blood pressure 110/65, pulse (!) 59, temperature 98.1 F (36.7 C), height 5\' 6"  (1.676 m), weight 205 lb (93 kg), SpO2 99 %. Body mass index is 33.09 kg/m.  General: She is overweight, cooperative, alert, well developed, and in no acute distress. PSYCH: Has normal mood, affect and thought process.   Lungs: Normal breathing effort, no conversational dyspnea.   ASSESSMENT AND PLAN  TREATMENT PLAN FOR OBESITY:  Recommended Dietary Goals  [name] is currently in the action stage of change. As such, her goal is to continue weight management plan. She has agreed to following a lower  carbohydrate, vegetable and lean protein rich diet plan.  Behavioral Intervention  We discussed the following Behavioral Modification Strategies today: increasing lean protein intake, decreasing simple carbohydrates , increasing vegetables, increasing lower glycemic fruits, increasing fiber rich foods, increasing water intake, work on meal planning and preparation, keeping healthy foods at home, continue to practice mindfulness when eating, and planning for success.  Additional resources provided today: NA  Recommended Physical Activity Goals  [name] has been advised to work up to 150 minutes of moderate intensity aerobic activity a week and strengthening exercises 2-3 times per week for cardiovascular health, weight loss maintenance and preservation of muscle mass.   She has agreed to Think about ways to increase daily physical activity and overcoming barriers to exercise  Pharmacotherapy changes for the treatment of obesity: None  ASSOCIATED CONDITIONS ADDRESSED TODAY  Type 2 diabetes mellitus with hypoglycemia without coma, without long-term current use of insulin (HCC) Assessment & Plan: Diet controlled T2DM.  A1c reducing nicely with dietary change and weight loss. A1c down to 5.9 08/30/2022, down from 6.1.  She has declined use of metformin.  She is reducing her intake of added sugar and refined carbohydrates.  Physical activity has been limited due to chronic right foot pain.  She hopes to increase exercise following right foot surgery later this month.  Congratulated patient on her improved numbers.  Continue to work  on A1c reduction with lifestyle change alone.      Morbid obesity (HCC) with starting BMI 35 Assessment & Plan: Reviewed patient's overall progress.  She has lost 17 pounds in the past 5 months.  This is a 7.6% total body weight loss without use of antiobesity medications.  She has been eating most of the food on her category 1 meal plan without cravings or  hunger. Reviewed her RMR from January 2024 at 1526. Given her age of 63, diet-controlled type 2 diabetes with hypertension, will be transitioning her over to a calorie controlled Mediterranean style diet.  Mediterranean style diet provided from [facility] clinic today.  Recommend tracking caloric intake on Mediterranean-style diet.  She was still may rotate in some of her meals from category 1 meal plan.  Aim for 1300 cal/day.  This should include about 90 g of protein daily.  Continue to limit high sugar foods and drinks.  With her inability to drive for period of time following her upcoming surgery, we will postpone her visit until August.   BMI 33.0-33.9,adult  Right foot pain Assessment & Plan: Chronic.  Patient has had limited ability to walk for exercise due to chronic right foot pain.  She is scheduled for surgery with Dr. Ihor Dickson later this month.  She may require right foot fusion per her history.  She plans to complete physical therapy after her surgery and hopes to increase exercise once she is fully healed.  Her limited ability to walk has hindered her weight loss for years.  Congratulated her on her weight reduction and improved glucose readings which will be beneficial with her upcoming surgery.       She was informed of the importance of frequent follow up visits to maximize her success with intensive lifestyle modifications for her multiple health conditions.   ATTESTASTION STATEMENTS:  Reviewed by clinician on day of visit: allergies, medications, problem list, medical history, surgical history, family history, social history, and previous encounter notes pertinent to obesity diagnosis.   I have personally spent 30 minutes total time today in preparation, patient care, nutritional counseling and documentation for this visit, including the following: review of clinical lab tests; review of medical tests/procedures/services.      Catherine Brink, DO DABFM, DABOM [facility] [address] [place], [place] [address] [phone]

## 2022-09-30 NOTE — Assessment & Plan Note (Signed)
Diet controlled T2DM.  A1c reducing nicely with dietary change and weight loss. A1c down to 5.9 08/30/2022, down from 6.1.  She has declined use of metformin.  She is reducing her intake of added sugar and refined carbohydrates.  Physical activity has been limited due to chronic right foot pain.  She hopes to increase exercise following right foot surgery later this month.  Congratulated patient on her improved numbers.  Continue to work on A1c reduction with lifestyle change alone.

## 2022-09-30 NOTE — Assessment & Plan Note (Signed)
Reviewed patient's overall progress.  She has lost 17 pounds in the past 5 months.  This is a 7.6% total body weight loss without use of antiobesity medications.  She has been eating most of the food on her category 1 meal plan without cravings or hunger. Reviewed her RMR from January 2024 at 1526. Given her age of 24, diet-controlled type 2 diabetes with hypertension, will be transitioning her over to a calorie controlled Mediterranean style diet.  Mediterranean style diet provided from [facility] clinic today.  Recommend tracking caloric intake on Mediterranean-style diet.  She was still may rotate in some of her meals from category 1 meal plan.  Aim for 1300 cal/day.  This should include about 90 g of protein daily.  Continue to limit high sugar foods and drinks.  With her inability to drive for period of time following her upcoming surgery, we will postpone her visit until August.

## 2022-09-30 NOTE — Assessment & Plan Note (Signed)
Chronic.  Patient has had limited ability to walk for exercise due to chronic right foot pain.  She is scheduled for surgery with Dr. Ihor Gully later this month.  She may require right foot fusion per her history.  She plans to complete physical therapy after her surgery and hopes to increase exercise once she is fully healed.  Her limited ability to walk has hindered her weight loss for years.  Congratulated her on her weight reduction and improved glucose readings which will be beneficial with her upcoming surgery.

## 2022-10-01 ENCOUNTER — Telehealth: Payer: Self-pay | Admitting: Internal Medicine

## 2022-10-01 NOTE — Telephone Encounter (Signed)
Copied from CRM [id number]. Topic: General - Other >> Sep 30, 2022  3:27 PM [place] B wrote: Reason for CRM: pt called in with fax number to send to [facility] for her MTI and A1c resulsts beforeher surgery Thursday. Fx is [phone]

## 2022-10-05 NOTE — Telephone Encounter (Signed)
Called & LVM for the patient. Patient needs to come in office to sign a ROI to be able to send results to [facility].

## 2022-10-05 NOTE — Telephone Encounter (Signed)
Pt wants to know if this has been faxed, please advise. Please call back Best contact: [phone]

## 2022-10-05 NOTE — Telephone Encounter (Signed)
She is also asking for her [place] imaging report from 07/04/2022 to be sent along with her (imaging of her right foot) A1C and MTI, she is asking for both of these to be faxed.

## 2022-10-06 NOTE — Telephone Encounter (Signed)
Called & spoke to the patient. Verified name & DOB. Informed that ROI will be needed to be able to send the A1C results to the surgeons office. Patient expressed understanding but is no longer requiring the requested results. Patient stated that she was able to get everything from Mychart. No further questions at this time.

## 2022-10-07 DIAGNOSIS — Z889 Allergy status to unspecified drugs, medicaments and biological substances status: Secondary | ICD-10-CM | POA: Diagnosis not present

## 2022-10-07 DIAGNOSIS — Z79899 Other long term (current) drug therapy: Secondary | ICD-10-CM | POA: Diagnosis not present

## 2022-10-07 DIAGNOSIS — E119 Type 2 diabetes mellitus without complications: Secondary | ICD-10-CM | POA: Diagnosis not present

## 2022-10-07 DIAGNOSIS — G8929 Other chronic pain: Secondary | ICD-10-CM | POA: Diagnosis not present

## 2022-10-07 DIAGNOSIS — Z981 Arthrodesis status: Secondary | ICD-10-CM | POA: Diagnosis not present

## 2022-10-07 DIAGNOSIS — Z91013 Allergy to seafood: Secondary | ICD-10-CM | POA: Diagnosis not present

## 2022-10-07 DIAGNOSIS — E785 Hyperlipidemia, unspecified: Secondary | ICD-10-CM | POA: Diagnosis not present

## 2022-10-07 DIAGNOSIS — Z91010 Allergy to peanuts: Secondary | ICD-10-CM | POA: Diagnosis not present

## 2022-10-07 DIAGNOSIS — Z91040 Latex allergy status: Secondary | ICD-10-CM | POA: Diagnosis not present

## 2022-10-07 DIAGNOSIS — Z7951 Long term (current) use of inhaled steroids: Secondary | ICD-10-CM | POA: Diagnosis not present

## 2022-10-07 DIAGNOSIS — Z91041 Radiographic dye allergy status: Secondary | ICD-10-CM | POA: Diagnosis not present

## 2022-10-07 DIAGNOSIS — M25571 Pain in right ankle and joints of right foot: Secondary | ICD-10-CM | POA: Diagnosis not present

## 2022-10-07 DIAGNOSIS — Z91018 Allergy to other foods: Secondary | ICD-10-CM | POA: Diagnosis not present

## 2022-10-07 DIAGNOSIS — Z880 Allergy status to penicillin: Secondary | ICD-10-CM | POA: Diagnosis not present

## 2022-10-07 DIAGNOSIS — M76821 Posterior tibial tendinitis, right leg: Secondary | ICD-10-CM | POA: Diagnosis not present

## 2022-10-07 DIAGNOSIS — I10 Essential (primary) hypertension: Secondary | ICD-10-CM | POA: Diagnosis not present

## 2022-10-07 DIAGNOSIS — J45909 Unspecified asthma, uncomplicated: Secondary | ICD-10-CM | POA: Diagnosis not present

## 2022-10-07 DIAGNOSIS — Z881 Allergy status to other antibiotic agents status: Secondary | ICD-10-CM | POA: Diagnosis not present

## 2022-10-07 DIAGNOSIS — M069 Rheumatoid arthritis, unspecified: Secondary | ICD-10-CM | POA: Diagnosis not present

## 2022-10-07 DIAGNOSIS — Z885 Allergy status to narcotic agent status: Secondary | ICD-10-CM | POA: Diagnosis not present

## 2022-10-08 DIAGNOSIS — G8929 Other chronic pain: Secondary | ICD-10-CM | POA: Diagnosis not present

## 2022-10-08 DIAGNOSIS — Z889 Allergy status to unspecified drugs, medicaments and biological substances status: Secondary | ICD-10-CM | POA: Diagnosis not present

## 2022-10-08 DIAGNOSIS — Z79899 Other long term (current) drug therapy: Secondary | ICD-10-CM | POA: Diagnosis not present

## 2022-10-08 DIAGNOSIS — M25571 Pain in right ankle and joints of right foot: Secondary | ICD-10-CM | POA: Diagnosis not present

## 2022-10-08 DIAGNOSIS — M069 Rheumatoid arthritis, unspecified: Secondary | ICD-10-CM | POA: Diagnosis not present

## 2022-10-08 DIAGNOSIS — Z91010 Allergy to peanuts: Secondary | ICD-10-CM | POA: Diagnosis not present

## 2022-10-08 DIAGNOSIS — E119 Type 2 diabetes mellitus without complications: Secondary | ICD-10-CM | POA: Diagnosis not present

## 2022-10-08 DIAGNOSIS — Z880 Allergy status to penicillin: Secondary | ICD-10-CM | POA: Diagnosis not present

## 2022-10-08 DIAGNOSIS — Z881 Allergy status to other antibiotic agents status: Secondary | ICD-10-CM | POA: Diagnosis not present

## 2022-10-08 DIAGNOSIS — Z91013 Allergy to seafood: Secondary | ICD-10-CM | POA: Diagnosis not present

## 2022-10-08 DIAGNOSIS — I10 Essential (primary) hypertension: Secondary | ICD-10-CM | POA: Diagnosis not present

## 2022-10-08 DIAGNOSIS — Z91018 Allergy to other foods: Secondary | ICD-10-CM | POA: Diagnosis not present

## 2022-10-08 DIAGNOSIS — Z885 Allergy status to narcotic agent status: Secondary | ICD-10-CM | POA: Diagnosis not present

## 2022-10-08 DIAGNOSIS — Z91040 Latex allergy status: Secondary | ICD-10-CM | POA: Diagnosis not present

## 2022-10-08 DIAGNOSIS — E785 Hyperlipidemia, unspecified: Secondary | ICD-10-CM | POA: Diagnosis not present

## 2022-10-08 DIAGNOSIS — Z7951 Long term (current) use of inhaled steroids: Secondary | ICD-10-CM | POA: Diagnosis not present

## 2022-10-08 DIAGNOSIS — Z91041 Radiographic dye allergy status: Secondary | ICD-10-CM | POA: Diagnosis not present

## 2022-10-08 DIAGNOSIS — J45909 Unspecified asthma, uncomplicated: Secondary | ICD-10-CM | POA: Diagnosis not present

## 2022-10-08 DIAGNOSIS — M76821 Posterior tibial tendinitis, right leg: Secondary | ICD-10-CM | POA: Diagnosis not present

## 2022-10-15 DIAGNOSIS — M25571 Pain in right ankle and joints of right foot: Secondary | ICD-10-CM | POA: Diagnosis not present

## 2022-10-15 DIAGNOSIS — M79671 Pain in right foot: Secondary | ICD-10-CM | POA: Diagnosis not present

## 2022-11-01 DIAGNOSIS — Z4889 Encounter for other specified surgical aftercare: Secondary | ICD-10-CM | POA: Diagnosis not present

## 2022-11-01 DIAGNOSIS — M76821 Posterior tibial tendinitis, right leg: Secondary | ICD-10-CM | POA: Diagnosis not present

## 2022-11-01 DIAGNOSIS — I10 Essential (primary) hypertension: Secondary | ICD-10-CM | POA: Diagnosis not present

## 2022-11-02 DIAGNOSIS — M2560 Stiffness of unspecified joint, not elsewhere classified: Secondary | ICD-10-CM | POA: Diagnosis not present

## 2022-11-02 DIAGNOSIS — M1991 Primary osteoarthritis, unspecified site: Secondary | ICD-10-CM | POA: Diagnosis not present

## 2022-11-02 DIAGNOSIS — M2540 Effusion, unspecified joint: Secondary | ICD-10-CM | POA: Diagnosis not present

## 2022-11-02 DIAGNOSIS — M109 Gout, unspecified: Secondary | ICD-10-CM | POA: Diagnosis not present

## 2022-11-02 DIAGNOSIS — M3505 Sjogren syndrome with inflammatory arthritis: Secondary | ICD-10-CM | POA: Diagnosis not present

## 2022-11-09 DIAGNOSIS — Z4889 Encounter for other specified surgical aftercare: Secondary | ICD-10-CM | POA: Diagnosis not present

## 2022-11-09 DIAGNOSIS — M25571 Pain in right ankle and joints of right foot: Secondary | ICD-10-CM | POA: Diagnosis not present

## 2022-11-16 ENCOUNTER — Ambulatory Visit: Payer: Medicare Other | Attending: Internal Medicine

## 2022-11-16 ENCOUNTER — Ambulatory Visit (INDEPENDENT_AMBULATORY_CARE_PROVIDER_SITE_OTHER): Payer: Medicare Other | Admitting: Family Medicine

## 2022-11-16 VITALS — Ht 66.0 in | Wt 205.0 lb

## 2022-11-16 DIAGNOSIS — Z0000 Encounter for general adult medical examination without abnormal findings: Secondary | ICD-10-CM | POA: Diagnosis not present

## 2022-11-16 NOTE — Patient Instructions (Signed)
[name] , Thank you for taking time to come for your Medicare Wellness Visit. I appreciate your ongoing commitment to your health goals. Please review the following plan we discussed and let me know if I can assist you in the future.   Referrals/Orders/Follow-Ups/Clinician Recommendations: Aim for 30 minutes of exercise or brisk walking, 6-8 glasses of water, and 5 servings of fruits and vegetables each day.  This is a list of the screening recommended for you and due dates:  Health Maintenance  Topic Date Due   COVID-19 Vaccine (3 - Mixed Product risk series) 08/01/2019   Eye exam for diabetics  10/07/2022   Flu Shot  11/11/2022   Hemoglobin A1C  03/02/2023   Yearly kidney health urinalysis for diabetes  04/28/2023   Complete foot exam   04/28/2023   Yearly kidney function blood test for diabetes  05/06/2023   Mammogram  05/14/2023   Medicare Annual Wellness Visit  11/16/2023   Colon Cancer Screening  05/14/2031   DTaP/Tdap/Td vaccine (3 - Td or Tdap) 01/30/2032   Pneumonia Vaccine  Completed   DEXA scan (bone density measurement)  Completed   Hepatitis C Screening  Completed   Zoster (Shingles) Vaccine  Completed   HPV Vaccine  Aged Out    Advanced directives: (ACP Link)Information on Advanced Care Planning can be found at [facility] of [place] Advance Health Care Directives Advance Health Care Directives ([email or web address]   Next Medicare Annual Wellness Visit scheduled for next year: Yes  Preventive Care 65 Years and Older, Female Preventive care refers to lifestyle choices and visits with your health care provider that can promote health and wellness. What does preventive care include? A yearly physical exam. This is also called an annual well check. Dental exams once or twice a year. Routine eye exams. Ask your health care provider how often you should have your eyes checked. Personal lifestyle choices, including: Daily care of your teeth and gums. Regular physical  activity. Eating a healthy diet. Avoiding tobacco and drug use. Limiting alcohol use. Practicing safe sex. Taking low-dose aspirin every day. Taking vitamin and mineral supplements as recommended by your health care provider. What happens during an annual well check? The services and screenings done by your health care provider during your annual well check will depend on your age, overall health, lifestyle risk factors, and family history of disease. Counseling  Your health care provider may ask you questions about your: Alcohol use. Tobacco use. Drug use. Emotional well-being. Home and relationship well-being. Sexual activity. Eating habits. History of falls. Memory and ability to understand (cognition). Work and work Astronomer. Reproductive health. Screening  You may have the following tests or measurements: Height, weight, and BMI. Blood pressure. Lipid and cholesterol levels. These may be checked every 5 years, or more frequently if you are over 27 years old. Skin check. Lung cancer screening. You may have this screening every year starting at age 10 if you have a 30-pack-year history of smoking and currently smoke or have quit within the past 15 years. Fecal occult blood test (FOBT) of the stool. You may have this test every year starting at age 71. Flexible sigmoidoscopy or colonoscopy. You may have a sigmoidoscopy every 5 years or a colonoscopy every 10 years starting at age 68. Hepatitis C blood test. Hepatitis B blood test. Sexually transmitted disease (STD) testing. Diabetes screening. This is done by checking your blood sugar (glucose) after you have not eaten for a while (fasting). You may have  this done every 1-3 years. Bone density scan. This is done to screen for osteoporosis. You may have this done starting at age 48. Mammogram. This may be done every 1-2 years. Talk to your health care provider about how often you should have regular mammograms. Talk with your  health care provider about your test results, treatment options, and if necessary, the need for more tests. Vaccines  Your health care provider may recommend certain vaccines, such as: Influenza vaccine. This is recommended every year. Tetanus, diphtheria, and acellular pertussis (Tdap, Td) vaccine. You may need a Td booster every 10 years. Zoster vaccine. You may need this after age 50. Pneumococcal 13-valent conjugate (PCV13) vaccine. One dose is recommended after age 71. Pneumococcal polysaccharide (PPSV23) vaccine. One dose is recommended after age 52. Talk to your health care provider about which screenings and vaccines you need and how often you need them. This information is not intended to replace advice given to you by your health care provider. Make sure you discuss any questions you have with your health care provider. Document Released: 04/25/2015 Document Revised: 12/17/2015 Document Reviewed: 01/28/2015 Elsevier Interactive Patient Education  2017 [employer].  Fall Prevention in the Home Falls can cause injuries. They can happen to people of all ages. There are many things you can do to make your home safe and to help prevent falls. What can I do on the outside of my home? Regularly fix the edges of walkways and driveways and fix any cracks. Remove anything that might make you trip as you walk through a door, such as a raised step or threshold. Trim any bushes or trees on the path to your home. Use bright outdoor lighting. Clear any walking paths of anything that might make someone trip, such as rocks or tools. Regularly check to see if handrails are loose or broken. Make sure that both sides of any steps have handrails. Any raised decks and porches should have guardrails on the edges. Have any leaves, snow, or ice cleared regularly. Use sand or salt on walking paths during winter. Clean up any spills in your garage right away. This includes oil or grease spills. What can I  do in the bathroom? Use night lights. Install grab bars by the toilet and in the tub and shower. Do not use towel bars as grab bars. Use non-skid mats or decals in the tub or shower. If you need to sit down in the shower, use a plastic, non-slip stool. Keep the floor dry. Clean up any water that spills on the floor as soon as it happens. Remove soap buildup in the tub or shower regularly. Attach bath mats securely with double-sided non-slip rug tape. Do not have throw rugs and other things on the floor that can make you trip. What can I do in the bedroom? Use night lights. Make sure that you have a light by your bed that is easy to reach. Do not use any sheets or blankets that are too big for your bed. They should not hang down onto the floor. Have a firm chair that has side arms. You can use this for support while you get dressed. Do not have throw rugs and other things on the floor that can make you trip. What can I do in the kitchen? Clean up any spills right away. Avoid walking on wet floors. Keep items that you use a lot in easy-to-reach places. If you need to reach something above you, use a strong step stool  that has a grab bar. Keep electrical cords out of the way. Do not use floor polish or wax that makes floors slippery. If you must use wax, use non-skid floor wax. Do not have throw rugs and other things on the floor that can make you trip. What can I do with my stairs? Do not leave any items on the stairs. Make sure that there are handrails on both sides of the stairs and use them. Fix handrails that are broken or loose. Make sure that handrails are as long as the stairways. Check any carpeting to make sure that it is firmly attached to the stairs. Fix any carpet that is loose or worn. Avoid having throw rugs at the top or bottom of the stairs. If you do have throw rugs, attach them to the floor with carpet tape. Make sure that you have a light switch at the top of the stairs  and the bottom of the stairs. If you do not have them, ask someone to add them for you. What else can I do to help prevent falls? Wear shoes that: Do not have high heels. Have rubber bottoms. Are comfortable and fit you well. Are closed at the toe. Do not wear sandals. If you use a stepladder: Make sure that it is fully opened. Do not climb a closed stepladder. Make sure that both sides of the stepladder are locked into place. Ask someone to hold it for you, if possible. Clearly mark and make sure that you can see: Any grab bars or handrails. First and last steps. Where the edge of each step is. Use tools that help you move around (mobility aids) if they are needed. These include: Canes. Walkers. Scooters. Crutches. Turn on the lights when you go into a dark area. Replace any light bulbs as soon as they burn out. Set up your furniture so you have a clear path. Avoid moving your furniture around. If any of your floors are uneven, fix them. If there are any pets around you, be aware of where they are. Review your medicines with your doctor. Some medicines can make you feel dizzy. This can increase your chance of falling. Ask your doctor what other things that you can do to help prevent falls. This information is not intended to replace advice given to you by your health care provider. Make sure you discuss any questions you have with your health care provider. Document Released: 01/23/2009 Document Revised: 09/04/2015 Document Reviewed: 05/03/2014 Elsevier Interactive Patient Education  2017 [employer].

## 2022-11-16 NOTE — Progress Notes (Signed)
Subjective:   [name] is a 67 y.o. female who presents for Medicare Annual (Subsequent) preventive examination.  Visit Complete: Virtual  I connected with  [name] on 11/16/22 by a audio enabled telemedicine application and verified that I am speaking with the correct person using two identifiers.  Patient Location: Home  Provider Location: Home Office  I discussed the limitations of evaluation and management by telemedicine. The patient expressed understanding and agreed to proceed.  Vital Signs: Per patient no change in vitals since last visit.   Review of Systems     Cardiac Risk Factors include: advanced age (>71men, >67 women);hypertension;diabetes mellitus     Objective:    Today's Vitals   11/16/22 2202  Weight: 205 lb (93 kg)  Height: 5\' 6"  (1.676 m)   Body mass index is 33.09 kg/m.     11/16/2022   10:40 PM 01/29/2022   11:03 AM 12/15/2021    1:06 PM  Advanced Directives  Does Patient Have a Medical Advance Directive? No Yes Yes  Type of Advance Directive  Living will Healthcare Power of [place];Living will  Does patient want to make changes to medical advance directive?   No - Patient declined  Copy of Healthcare Power of Attorney in Chart?   No - copy requested  Would patient like information on creating a medical advance directive? Yes (MAU/Ambulatory/Procedural Areas - Information given)      Current Medications (verified) Outpatient Encounter Medications as of 11/16/2022  Medication Sig   acetaminophen (TYLENOL) 650 MG CR tablet Take 650 mg by mouth as needed.     albuterol (VENTOLIN HFA) 108 (90 Base) MCG/ACT inhaler Inhale 2 puffs into the lungs every 6 (six) hours as needed for wheezing or shortness of breath.   allopurinol (ZYLOPRIM) 100 MG tablet Take 1 tablet (100 mg total) by mouth daily.   ascorbic acid (VITAMIN C) 500 MG tablet Take 500 mg by mouth daily.   atorvastatin (LIPITOR) 10 MG tablet 1 tab PO Q Mon/Wed/Frid   b complex  vitamins capsule Take 1 capsule by mouth daily. 1 chewable once daily.   BIOTIN PO Take by mouth.   Calcium 500-2.5 MG-MCG CHEW Chew 2 tablets by mouth daily.   Cetirizine HCl (ZYRTEC PO) Take 10 mg by mouth.    Cholecalciferol (VITAMIN D PO) Take by mouth. TAKES 2000    Cinnamon 500 MG TABS Take 2 tablets by mouth daily.   Continuous Blood Gluc Receiver (FREESTYLE LIBRE 2 READER) DEVI Use to check blood sugar three times daily. Change sensors once every 14 days. E11.65   Continuous Blood Gluc Sensor (FREESTYLE LIBRE 2 SENSOR) MISC Use to check blood sugar three times daily. Change sensors once every 14 days. E11.65   cyclobenzaprine (FLEXERIL) 10 MG tablet Take 1 tablet (10 mg total) by mouth 3 (three) times daily as needed.   cycloSPORINE (RESTASIS) 0.05 % ophthalmic emulsion 1 drop 2 (two) times daily.   dexlansoprazole (DEXILANT) 60 MG capsule Take 1 capsule (60 mg total) by mouth every morning.   diclofenac Sodium (VOLTAREN) 1 % GEL Apply 2 g topically daily. Once daily in the morning as needed.   EPINEPHrine 0.3 mg/0.3 mL IJ SOAJ injection Inject 0.3 mg into the muscle as needed for anaphylaxis.   ergocalciferol (VITAMIN D2) 1.25 MG (50000 UT) capsule Take 1 capsule (50,000 Units total) by mouth once a week.   folic acid (FOLVITE) 1 MG tablet 1 (one) time each day at the same time.  Glucose 15 g PACK Take by mouth. Chew up to 4 tablets prn.   glucose blood (FREESTYLE LITE) test strip Use as instructed   hydrochlorothiazide (HYDRODIURIL) 25 MG tablet Take 1 tablet (25 mg total) by mouth 2 (two) times daily.   ipratropium-albuterol (DUONEB) 0.5-2.5 (3) MG/3ML SOLN Take 3 mLs by nebulization every 6 (six) hours as needed.   Lancets (FREESTYLE) lancets Use as instructed   meclizine (ANTIVERT) 25 MG tablet Take 1 tablet (25 mg total) by mouth 2 (two) times daily as needed for dizziness.   meloxicam (MOBIC) 15 MG tablet Take 1 tablet (15 mg total) by mouth daily.   methotrexate (RHEUMATREX)  2.5 MG tablet    montelukast (SINGULAIR) 10 MG tablet Take 1 tablet (10 mg total) by mouth at bedtime.   Multiple Vitamins-Iron (MULTIVITAMIN/IRON PO) Take by mouth.     nebivolol (BYSTOLIC) 5 MG tablet Take 1 tablet (5 mg total) by mouth every morning.   polyethylene glycol (MIRALAX / GLYCOLAX) 17 g packet Take 17 g by mouth daily.   triamcinolone (NASACORT) 55 MCG/ACT nasal inhaler Place 2 sprays into the nose as needed.     valACYclovir (VALTREX) 500 MG tablet Take 1 tablet (500 mg total) by mouth daily.   vitamin E 180 MG (400 UNITS) capsule Take 400 Units by mouth in the morning and at bedtime.   benzonatate (TESSALON) 200 MG capsule Take 1 capsule (200 mg total) by mouth 3 (three) times daily as needed for cough. (Patient not taking: Reported on 11/16/2022)   hydroxychloroquine (PLAQUENIL) 200 MG tablet Take 2 tabs Orally daily for 90 days (Patient not taking: Reported on 11/16/2022)   No facility-administered encounter medications on file as of 11/16/2022.    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   HTN (hypertension)    Joint pain    Lactose intolerance    Multiple food allergies    OA (osteoarthritis)    Reflux    Sjogren's syndrome (HCC)    SOB (shortness of breath)    Swallowing difficulty    Thyroid condition    Vitamin D deficiency    Past Surgical History:  Procedure Laterality Date   APPENDECTOMY     BACK SURGERY  03/12/2010   L5   BREAST SURGERY     BIOPSY   HERNIA REPAIR     groin  bilateral   NECK SURGERY     PELVIC LAPAROSCOPY  [id number],1829   PILONIDAL CYST EXCISION     SALPINGOOPHORECTOMY  10/11/2002   SCOPE LSO-LEFT SEROUS CYSTADENOMA   SALPINGOOPHORECTOMY  04/12/2005   RSO/TOA   SHOULDER SURGERY Right 01/2018   TONSILLECTOMY AND ADENOIDECTOMY     TUBAL LIGATION     VAGINAL HYSTERECTOMY  01/10/2005   TVH, POSTERIOR REPAIR   Family History  Problem Relation Age of Onset   Hypertension Mother    Heart disease Mother    Arthritis Mother    Hyperlipidemia Mother    Stroke Mother    Heart disease Father    Hypertension Father    Cancer Father  Prostate and lung   Alcohol abuse Father    Anxiety disorder Father    Cancer Paternal Grandmother        COLON CA   Breast cancer Maternal Aunt        LATE 50'S   Ovarian cancer Maternal Aunt    Social History   Socioeconomic History   Marital status: Single    Spouse name: Not on file   Number of children: Not on file   Years of education: Not on file   Highest education level: Bachelor's degree (e.g., BA, AB, BS)  Occupational History   Not on file  Tobacco Use   Smoking status: Former   Smokeless tobacco: Never  Vaping Use   Vaping status: Never Used  Substance and Sexual Activity   Alcohol use: Yes    Comment: Rare   Drug use: No   Sexual activity: Not Currently    Birth control/protection: Post-menopausal  Other Topics Concern   Not on file  Social History Narrative   Not on file   Social Determinants of Health   Financial Resource Strain: Low Risk  (11/16/2022)   Overall Financial Resource Strain (CARDIA)    Difficulty of Paying Living Expenses: Not hard at all  Food Insecurity: No Food Insecurity (11/16/2022)   Hunger Vital Sign    Worried About Running Out of Food in the Last Year: Never true    Ran Out of Food in the Last Year: Never true  Transportation Needs: No Transportation Needs (11/16/2022)   PRAPARE - Administrator, Civil Service (Medical): No    Lack of  Transportation (Non-Medical): No  Physical Activity: Inactive (11/16/2022)   Exercise Vital Sign    Days of Exercise per Week: 0 days    Minutes of Exercise per Session: 0 min  Stress: No Stress Concern Present (11/16/2022)   [employer] of Occupational Health - Occupational Stress Questionnaire    Feeling of Stress : Only a little  Social Connections: Moderately Integrated (11/16/2022)   Social Connection and Isolation Panel [NHANES]    Frequency of Communication with Friends and Family: More than three times a week    Frequency of Social Gatherings with Friends and Family: Three times a week    Attends Religious Services: More than 4 times per year    Active Member of Clubs or Organizations: Yes    Attends Engineer, structural: More than 4 times per year    Marital Status: Divorced    Tobacco Counseling Counseling given: Not Answered   Clinical Intake:  Pre-visit preparation completed: Yes  Pain : No/denies pain     Diabetes: Yes CBG done?: No Did pt. bring in CBG monitor from home?: No  How often do you need to have someone help you when you read instructions, pamphlets, or other written materials from your doctor or pharmacy?: 1 - Never  Interpreter Needed?: No  Information entered by :: Kandis Fantasia LPN   Activities of Daily Living    11/16/2022   10:38 PM 01/29/2022   11:06 AM  In your present state of health, do you have any difficulty performing the following activities:  Hearing? 0 0  Vision? 0 1  Difficulty concentrating or making decisions? 0 0  Walking or climbing stairs? 0 0  Dressing or bathing? 0   Doing errands, shopping? 0 0  Preparing Food and eating ? N N  Using the Toilet? N N  In the past six months, have you  accidently leaked urine? N Y  Comment  Sees PT for pelvic floor exercises  Do you have problems with loss of bowel control? N N  Managing your Medications? N N  Managing your Finances? N N  Housekeeping or managing your  Housekeeping? N N    Patient Care Team: Marcine Matar, MD as PCP - General (Internal Medicine) Blenda Bridegroom, MD as Referring Physician (Orthopedic Surgery) [facility], [facility] Rhe[facility]ology, [facility] (Rheumatology)  Indicate any recent Medical Services you may have received from other than [facility] providers in the past year (date may be approximate).     Assessment:   This is a routine wellness examination for Zar[place].  Hearing/Vision screen Hearing Screening - Comments:: Denies hearing difficulties   Vision Screening - Comments:: Wears rx glasses - up to date with routine eye exams with [facility]    Dietary issues and exercise activities discussed:     Goals Addressed             This Visit's Progress    Remain active and independent        Depression Screen    11/16/2022   10:37 PM 08/30/2022   10:55 AM 04/27/2022   10:23 AM 01/29/2022   11:04 AM 12/24/2021   11:32 AM  PHQ 2/9 Scores  PHQ - 2 Score 0 0 0 1 2  PHQ- 9 Score  6 5 1 4     Fall Risk    11/16/2022   10:38 PM 08/30/2022   11:07 AM 04/27/2022   10:13 AM 01/29/2022   11:04 AM 12/24/2021   10:03 AM  Fall Risk   Falls in the past year? 1  0 0 0  Number falls in past yr: 0 0 0 0 0  Injury with Fall? 1 0 0 0 0  Risk for fall due to : History of fall(s) No Fall Risks No Fall Risks  No Fall Risks  Follow up Falls prevention discussed;Education provided;Falls evaluation completed Falls evaluation completed  Falls evaluation completed;Education provided;Falls prevention discussed Falls evaluation completed    MEDICARE RISK AT HOME:  Medicare Risk at Home - 11/16/22 2238     Any stairs in or around the home? No    If so, are there any without handrails? No    Home free of loose throw rugs in walkways, pet beds, electrical cords, etc? Yes    Adequate lighting in your home to reduce risk of falls? Yes    Life alert? No    Use of a cane, walker or w/c? No    Grab bars in  the bathroom? Yes    Shower chair or bench in shower? No    Elevated toilet seat or a handicapped toilet? No             TIMED UP AND GO:  Was the test performed?  No    Cognitive Function:    01/29/2022   11:09 AM  MMSE - Mini Mental State Exam  Orientation to time 5  Orientation to Place 5  Registration 3  Attention/ Calculation 5  Recall 3  Language- name 2 objects 2  Language- repeat 1  Language- follow 3 step command 3  Language- read & follow direction 1  Write a sentence 1  Copy design 1  Total score 30        11/16/2022   10:40 PM  6CIT Screen  What Year? 0 points  What month? 0 points  What time? 0 points  Count back from 20 0 points  Months in reverse 0 points  Repeat phrase 0 points  Total Score 0 points    Immunizations Immunization History  Administered Date(s) Administered   Influenza Inj Mdck Quad With Preservative 02/26/2019   Influenza, Seasonal, Injecte, Preservative Fre 12/24/2021   Influenza,inj,Quad PF,6+ Mos 01/16/2018, 12/24/2021   Influenza,inj,quad, With Preservative 04/13/2015, 02/21/2020   PNEUMOCOCCAL CONJUGATE-20 12/24/2021   Pneumococcal Conjugate,unspecified 12/24/2021   Tdap 04/13/2011, 01/29/2022   Unspecified SARS-COV-2 Vaccination 06/06/2019, 07/04/2019   Zoster Recombinant(Shingrix) 04/29/2022, 08/04/2022    TDAP status: Up to date  Flu Vaccine status: Due, Education has been provided regarding the importance of this vaccine. Advised may receive this vaccine at local pharmacy or Health Dept. Aware to provide a copy of the vaccination record if obtained from local pharmacy or Health Dept. Verbalized acceptance and understanding.  Pneumococcal vaccine status: Up to date  Covid-19 vaccine status: Information provided on how to obtain vaccines.   Qualifies for Shingles Vaccine? Yes   Zostavax completed No   Shingrix Completed?: Yes  Screening Tests Health Maintenance  Topic Date Due   COVID-19 Vaccine (3 - Mixed  Product risk series) 08/01/2019   OPHTHALMOLOGY EXAM  10/07/2022   INFLUENZA VACCINE  11/11/2022   HEMOGLOBIN A1C  03/02/2023   Diabetic kidney evaluation - Urine ACR  04/28/2023   FOOT EXAM  04/28/2023   Diabetic kidney evaluation - eGFR measurement  05/06/2023   MAMMOGRAM  05/14/2023   Medicare Annual Wellness (AWV)  11/16/2023   Colonoscopy  05/14/2031   DTaP/Tdap/Td (3 - Td or Tdap) 01/30/2032   Pneumonia Vaccine 55+ Years old  Completed   DEXA SCAN  Completed   Hepatitis C Screening  Completed   Zoster Vaccines- Shingrix  Completed   HPV VACCINES  Aged Out    Health Maintenance  Health Maintenance Due  Topic Date Due   COVID-19 Vaccine (3 - Mixed Product risk series) 08/01/2019   OPHTHALMOLOGY EXAM  10/07/2022   INFLUENZA VACCINE  11/11/2022    Colorectal cancer screening: Type of screening: Colonoscopy. Completed 05/13/21. Repeat every 10 years  Mammogram status: Completed 05/13/21. Repeat every year  Bone Density status: Completed 04/13/21. Results reflect: Bone density results: NORMAL. Repeat every 5 years.  Lung Cancer Screening: (Low Dose CT Chest recommended if Age 43-80 years, 20 pack-year currently smoking OR have quit w/in 15years.) does not qualify.   Lung Cancer Screening Referral: n/a  Additional Screening:  Hepatitis C Screening: does qualify; Completed 01/29/22  Vision Screening: Recommended annual ophthalmology exams for early detection of glaucoma and other disorders of the eye. Is the patient up to date with their annual eye exam?  Yes  Who is the provider or what is the name of the office in which the patient attends annual eye exams? [facility] If pt is not established with a provider, would they like to be referred to a provider to establish care? No .   Dental Screening: Recommended annual dental exams for proper oral hygiene  Diabetic Foot Exam: Diabetic Foot Exam: Completed 04/27/22  Community Resource Referral / Chronic Care  Management: CRR required this visit?  No   CCM required this visit?  No     Plan:     I have personally reviewed and noted the following in the patient's chart:   Medical and social history Use of alcohol, tobacco or illicit drugs  Current medications and supplements including opioid prescriptions. Patient is not currently taking opioid prescriptions. Functional ability and  status Nutritional status Physical activity Advanced directives List of other physicians Hospitalizations, surgeries, and ER visits in previous 12 months Vitals Screenings to include cognitive, depression, and falls Referrals and appointments  In addition, I have reviewed and discussed with patient certain preventive protocols, quality metrics, and best practice recommendations. A written personalized care plan for preventive services as well as general preventive health recommendations were provided to patient.     Kandis Fantasia [place], [place]   4/0/9811   After Visit Summary: (MyChart) Due to this being a telephonic visit, the after visit summary with patients personalized plan was offered to patient via MyChart   Nurse Notes: No concerns at this time

## 2022-11-30 DIAGNOSIS — M79671 Pain in right foot: Secondary | ICD-10-CM | POA: Diagnosis not present

## 2022-11-30 DIAGNOSIS — M3505 Sjogren syndrome with inflammatory arthritis: Secondary | ICD-10-CM | POA: Diagnosis not present

## 2022-11-30 DIAGNOSIS — M25571 Pain in right ankle and joints of right foot: Secondary | ICD-10-CM | POA: Diagnosis not present

## 2022-12-09 ENCOUNTER — Encounter: Payer: Self-pay | Admitting: Physical Therapy

## 2022-12-09 ENCOUNTER — Other Ambulatory Visit: Payer: Self-pay

## 2022-12-09 ENCOUNTER — Ambulatory Visit: Payer: Medicare Other | Attending: Physician Assistant | Admitting: Physical Therapy

## 2022-12-09 DIAGNOSIS — M25571 Pain in right ankle and joints of right foot: Secondary | ICD-10-CM | POA: Insufficient documentation

## 2022-12-09 DIAGNOSIS — R2689 Other abnormalities of gait and mobility: Secondary | ICD-10-CM | POA: Diagnosis not present

## 2022-12-09 DIAGNOSIS — M6281 Muscle weakness (generalized): Secondary | ICD-10-CM | POA: Insufficient documentation

## 2022-12-09 DIAGNOSIS — R600 Localized edema: Secondary | ICD-10-CM | POA: Diagnosis not present

## 2022-12-09 NOTE — Therapy (Addendum)
OUTPATIENT PHYSICAL THERAPY EVALUATION   Patient Name: [name] MRN: [id number] DOB:04-07-1956, 67 y.o., female Today's Date: 12/10/2022   END OF SESSION:  PT End of Session - 12/09/22 1438     Visit Number 1    Number of Visits 17    Date for PT Re-Evaluation 02/03/23    Authorization Type [employer] MCR    Progress Note Due on Visit 10    PT Start Time 1400    PT Stop Time 1440    PT Time Calculation (min) 40 min    Activity Tolerance Patient tolerated treatment well    Behavior During Therapy WFL for tasks assessed/performed             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   HTN (hypertension)    Joint pain    Lactose intolerance    Multiple food allergies    OA (osteoarthritis)    Reflux    Sjogren's syndrome (HCC)    SOB (shortness of breath)    Swallowing difficulty    Thyroid condition    Vitamin D deficiency    Past Surgical History:  Procedure Laterality Date   APPENDECTOMY     BACK SURGERY  03/12/2010   L5   BREAST SURGERY     BIOPSY   HERNIA REPAIR     groin bilateral   NECK SURGERY     PELVIC LAPAROSCOPY  0272,5366   PILONIDAL CYST EXCISION     SALPINGOOPHORECTOMY  10/11/2002   SCOPE LSO-LEFT SEROUS CYSTADENOMA   SALPINGOOPHORECTOMY  04/12/2005   RSO/TOA   SHOULDER SURGERY Right 01/2018   TONSILLECTOMY AND ADENOIDECTOMY     TUBAL LIGATION     VAGINAL HYSTERECTOMY  01/10/2005   TVH, POSTERIOR REPAIR   Patient Active Problem List   Diagnosis Date Noted   Dry mouth 06/24/2022   Joint swelling 06/24/2022   Other specified abnormal immunological findings in serum 06/24/2022   Pain in limb 06/24/2022   Insulin resistance 05/19/2022   Low mean corpuscular hemoglobin concentration (MCHC) 05/19/2022   Other fatigue 05/05/2022   SOBOE (shortness of breath on  exertion) 05/05/2022   Right foot pain 05/05/2022   Depression screen 05/05/2022   Arthritis 03/23/2022   Hyperlipidemia associated with type 2 diabetes mellitus (HCC) 12/24/2021   Essential hypertension 10/21/2021   Type 2 diabetes mellitus with hypoglycemia without coma, without long-term current use of insulin (HCC) 10/21/2021   Primary osteoarthritis involving multiple joints 10/21/2021   Mild intermittent asthma without complication 10/21/2021   History of herpes genitalis 10/21/2021   History of gout 10/21/2021   Gastroesophageal reflux disease without esophagitis 10/21/2021   Morbid obesity (HCC) with starting BMI 35 10/21/2021   Persistent cough 04/20/2021   Chronic vertigo 12/04/2020   COVID-19 04/16/2020   Neuropathic pain 04/12/2020   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   Gout 12/19/2019   Carotid artery stenosis 11/24/2019   Multiple joint pain 11/24/2019   Allergy to food 05/05/2018   Non-toxic nodular goiter 08/31/2017   Reflux    Asthma    Sjogren syndrome with inflammatory arthritis (HCC)     PCP: Marcine Matar, MD  REFERRING PROVIDER: Raquel James, PA-C   REFERRING DIAG:  Pain in right ankle and joints of right foot   THERAPY DIAG:  Pain in right ankle and joints of right foot  Muscle weakness (generalized)  Other abnormalities of gait and mobility  Localized edema  Rationale for Evaluation and Treatment: Rehabilitation  ONSET DATE: 10/07/2022   SUBJECTIVE:  SUBJECTIVE STATEMENT: Patient reports arthritis of the right ankle that led up to her surgery on 10/07/2022. She is currently in a CAM boot and using a walker. She doesn't have the pain or swelling that she used to, but still with some soreness with walking. She uses a compression sock to help with her swelling and she can feel the screw that was put in the back of the heel.   PERTINENT HISTORY: DOS: 10/07/2022 Right triple arthrodesis (subtalar joint, the talonavicular joint, and  the calcaneocuboid joint) gastrocnemius recession peroneal tendon lengthening with tibial bone marrow aspirate  PAIN:  Are you having pain?  NPRS scale: 0/10 Pain location: Right ankle  PRECAUTIONS: Fall  RED FLAGS: None   WEIGHT BEARING RESTRICTIONS: Yes per surgeon on 11/30/2022 PWB for 2 weeks in CAM book, then WBAT in CAM boot  FALLS:  Has patient fallen in last 6 months? Yes. Number of falls 3  LIVING ENVIRONMENT: Lives with: lives alone (currently living with mother) Lives in: House/apartment Stairs: Yes: External: 4 steps; can reach both  PLOF: Independent  PATIENT GOALS: Get back to walking without limitation    OBJECTIVE:  PATIENT SURVEYS:  FOTO 34% functional status  COGNITION: Overall cognitive status: Within functional limits for tasks assessed     SENSATION: WFL  EDEMA:  Figure 8: right 56 cm, left 52 cm  MUSCLE LENGTH: Calf flexibility deficit  PALPATION: Patient reports tenderness over incisions and portal sites  LOWER EXTREMITY ROM:  Active ROM Right eval Left eval  Hip flexion    Hip extension    Hip abduction    Hip adduction    Hip internal rotation    Hip external rotation    Knee flexion    Knee extension    Ankle dorsiflexion Lacking 5   Ankle plantarflexion 30   Ankle inversion 0   Ankle eversion 0    (Blank rows = not tested)  LOWER EXTREMITY MMT:  MMT Right eval Left eval  Hip flexion    Hip extension    Hip abduction    Hip adduction    Hip internal rotation    Hip external rotation    Knee flexion    Knee extension    Ankle dorsiflexion 4   Ankle plantarflexion -   Ankle inversion -   Ankle eversion -    (Blank rows = not tested)  FUNCTIONAL TESTS:  Not assessed  GAIT: Distance walked: 50 ft Assistive device utilized: Environmental consultant - 2 wheeled Level of assistance: Modified independence Comments: Step-to gait pattern with PWB on right   TODAY'S TREATMENT:    [facility] Adult PT Treatment:                                                 DATE: 12/09/2022 Therapeutic Exercise: Longsitting calf stretch 2 x 20 sec Longsitting ankle PF with green x 10 Seated figure-4 ankle PF/DF and toe PROM Seated heel toe raises x 10 Seated towel scrunches x 10  PATIENT EDUCATION:  Education details: Exam findings, POC, HEP, instruction on  PWB, continued elevation and icing Person educated: Patient Education method: Explanation, Demonstration, Tactile cues, Verbal cues, and Handouts Education comprehension: verbalized understanding, returned demonstration, verbal cues required, tactile cues required, and needs further education  HOME EXERCISE PROGRAM: Access Code: [email or web address]   ASSESSMENT: CLINICAL IMPRESSION: Patient is a 67 y.o. female who was seen today for physical therapy evaluation and treatment for right ankle pain and stiffness following surgery on 10/07/2022. She does exhibit limitations in her right ankle motion and strength, as well as increased ankle edema and is limited with walking due to weight bearing restrictions and use of CAM boot She had fusion of subtalar joint so expect significant limitations with inversion/eversion while she still has use of talocrural joint so will focus on improving ankle dorsiflexon and plantarflexion mobility and strengthening until weight bearing restrictions removed.   OBJECTIVE IMPAIRMENTS: Abnormal gait, decreased activity tolerance, decreased balance, difficulty walking, decreased ROM, decreased strength, increased edema, impaired flexibility, and pain.   ACTIVITY LIMITATIONS: standing, squatting, stairs, transfers, bathing, dressing, and locomotion level  PARTICIPATION LIMITATIONS: meal prep, cleaning, laundry, driving, shopping, and community activity  PERSONAL FACTORS: Fitness, Past/current experiences, and Time since onset of injury/illness/exacerbation are also affecting patient's functional outcome.   REHAB POTENTIAL: Good  CLINICAL DECISION MAKING:  Stable/uncomplicated  EVALUATION COMPLEXITY: Low   GOALS: Goals reviewed with patient? Yes  SHORT TERM GOALS: Target date: 01/06/2023  Patient will be I with initial HEP in order to progress with therapy. Baseline: HEP provided at eval Goal status: INITIAL  2.  Patient will demonstrate normalized gait mechanics with LRAD in order to improve mobility and community access Baseline: see limitations above Goal status: INITIAL  3.  Patient will demonstrate >/= 5 deg right ankle DF in order to normalize gait Baseline: lacking 5 deg Goal status: INITIAL  LONG TERM GOALS: Target date: 02/03/2023  Patient will be I with final HEP to maintain progress from PT. Baseline: HEP provided at eval Goal status: INITIAL  2.  Patient will report >/= 52% status on FOTO to indicate improved functional ability. Baseline: 34% functional status Goal status: INITIAL  3.  Patient will demonstrate right ankle DF/PF strength >/= 4/5 MMT in order to improve gait and weight bearing tolerance Baseline: see limitations above Goal status: INITIAL  4.  Patient will report no limitations with community ambulation using LRAD in order to maximize her level of function Baseline: limited with household mobility Goal status: INITIAL   PLAN: PT FREQUENCY: 1-2x/week  PT DURATION: 8 weeks  PLANNED INTERVENTIONS: Therapeutic exercises, Therapeutic activity, Neuromuscular re-education, Balance training, Gait training, Patient/Family education, Self Care, Joint mobilization, Aquatic Therapy, Dry Needling, Cryotherapy, Moist heat, scar mobilization, Taping, Vasopneumatic device, Manual therapy, and Re-evaluation  PLAN FOR NEXT SESSION: Review HEP and progress PRN, right ankle stretching for DF/PF, ankle and foot intrinsic strengthening, gait training and progression to WBAT as allowed, balance and ankle stability training   Rosana Hoes, PT, DPT, LAT, ATC 12/10/22  9:32 AM Phone: [phone] Fax:  [phone]    Date of referral: 12/06/2022 Referring provider: Raquel James, PA-C Referring diagnosis? M25.571 (ICD-10-CM) - Pain in right ankle and joints of right foot  Treatment diagnosis? (if different than referring diagnosis) M25.571 (ICD-10-CM) - Pain in right ankle and joints of right foot  What was this (referring dx) caused by? [x]  Surgery (Type: _____Right triple arthrodesis (subtalar joint, the talonavicular joint, and the calcaneocuboid joint) gastrocnemius recession peroneal tendon lengthening with tibial bone marrow aspirate ____) []  Fall []  Ongoing issue []   Arthritis []  Unspecified []  Work related []  Librarian, academic []  Other: ____________  Ashby Dawes of Condition  []  Initial Onset (within last 3 months)  []  Recurrent (multiple episodes of < 3 months)  [x]  Chronic (continuous duration > 3 months)  Laterality: [x]  Rt []  Lt []  Both  Current Functional Measure Score  [x]  FOTO ____34% functional status ____ []  Neck Index ______  []  Back Index ______  []  DASH _______  []  LEFS________  Briefly describe symptoms: Right ankle pain, weakness, stiffness  How did symptoms start: Surgery  Average pain intensity:  Last 24 hours: 0  Past week: 2  How often does the pt experience symptoms?  []  Constantly []  Frequently [x]  Occasionally []  Intermittently  How much have the symptoms interfered with usual daily activities?  []  Not at all  []  A little bit  []  Moderately  [x]  Quite a bit  []  Extremely  How has condition changed since care began at this facility?  [x]  NA - iniital visit  []  Much worse  []  Worse  []  A little worse  []  No change  []  A little better  []  Better  []  Much better  In general, how is the patients overall health?  []  Excellend  []  Very good  [x]  Good  []  Fair  []  Poor

## 2022-12-09 NOTE — Patient Instructions (Signed)
Access Code: [facility] URL: [email or web address] Date: 12/09/2022 Prepared by: Rosana Hoes  Exercises - Long Sitting Calf Stretch with Strap  - 2-3 x daily - 3 reps - 20 seconds hold - Long Sitting Ankle Plantar Flexion with Resistance  - 1 x daily - 3 sets - 10 reps - Seated Ankle Plantarflexion Dorsiflexion PROM  - 2-3 x daily - 10 reps - 5 seconds hold - Seated Toe Flexion Extension PROM  - 2-3 x daily - 10 reps - 5 seconds hold - Seated Heel Toe Raises  - 1 x daily - 2 sets - 20 reps - Seated Toe Towel Scrunches  - 1 x daily - 2 sets - 20 reps

## 2022-12-22 ENCOUNTER — Encounter: Payer: Self-pay | Admitting: Physical Therapy

## 2022-12-22 ENCOUNTER — Ambulatory Visit: Payer: Medicare Other | Admitting: Physical Therapy

## 2022-12-23 ENCOUNTER — Ambulatory Visit: Payer: Medicare Other | Attending: Physician Assistant

## 2022-12-23 DIAGNOSIS — R600 Localized edema: Secondary | ICD-10-CM | POA: Diagnosis not present

## 2022-12-23 DIAGNOSIS — M6281 Muscle weakness (generalized): Secondary | ICD-10-CM | POA: Insufficient documentation

## 2022-12-23 DIAGNOSIS — R2689 Other abnormalities of gait and mobility: Secondary | ICD-10-CM | POA: Diagnosis not present

## 2022-12-23 DIAGNOSIS — M25571 Pain in right ankle and joints of right foot: Secondary | ICD-10-CM | POA: Diagnosis not present

## 2022-12-23 NOTE — Therapy (Signed)
OUTPATIENT PHYSICAL THERAPY TREATMENT NOTE   Patient Name: [name] MRN: [id number] DOB:1955/11/30, 67 y.o., female Today's Date: 12/23/2022   END OF SESSION:  PT End of Session - 12/23/22 1110     Visit Number 2    Number of Visits 17    Date for PT Re-Evaluation 02/03/23    Authorization Type [employer] MCR    Progress Note Due on Visit 10    PT Start Time 1110    PT Stop Time 1150    PT Time Calculation (min) 40 min    Activity Tolerance Patient tolerated treatment well    Behavior During Therapy WFL for tasks assessed/performed              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   HTN (hypertension)    Joint pain    Lactose intolerance    Multiple food allergies    OA (osteoarthritis)    Reflux    Sjogren's syndrome (HCC)    SOB (shortness of breath)    Swallowing difficulty    Thyroid condition    Vitamin D deficiency    Past Surgical History:  Procedure Laterality Date   APPENDECTOMY     BACK SURGERY  03/12/2010   L5   BREAST SURGERY     BIOPSY   HERNIA REPAIR     groin bilateral   NECK SURGERY     PELVIC LAPAROSCOPY  4098,1191   PILONIDAL CYST EXCISION     SALPINGOOPHORECTOMY  10/11/2002   SCOPE LSO-LEFT SEROUS CYSTADENOMA   SALPINGOOPHORECTOMY  04/12/2005   RSO/TOA   SHOULDER SURGERY Right 01/2018   TONSILLECTOMY AND ADENOIDECTOMY     TUBAL LIGATION     VAGINAL HYSTERECTOMY  01/10/2005   TVH, POSTERIOR REPAIR   Patient Active Problem List   Diagnosis Date Noted   Dry mouth 06/24/2022   Joint swelling 06/24/2022   Other specified abnormal immunological findings in serum 06/24/2022   Pain in limb 06/24/2022   Insulin resistance 05/19/2022   Low mean corpuscular hemoglobin concentration (MCHC) 05/19/2022   Other fatigue 05/05/2022   SOBOE (shortness of  breath on exertion) 05/05/2022   Right foot pain 05/05/2022   Depression screen 05/05/2022   Arthritis 03/23/2022   Hyperlipidemia associated with type 2 diabetes mellitus (HCC) 12/24/2021   Essential hypertension 10/21/2021   Type 2 diabetes mellitus with hypoglycemia without coma, without long-term current use of insulin (HCC) 10/21/2021   Primary osteoarthritis involving multiple joints 10/21/2021   Mild intermittent asthma without complication 10/21/2021   History of herpes genitalis 10/21/2021   History of gout 10/21/2021   Gastroesophageal reflux disease without esophagitis 10/21/2021   Morbid obesity (HCC) with starting BMI 35 10/21/2021   Persistent cough 04/20/2021   Chronic vertigo 12/04/2020   COVID-19 04/16/2020   Neuropathic pain 04/12/2020   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   Gout 12/19/2019   Carotid artery stenosis 11/24/2019   Multiple joint pain 11/24/2019   Allergy to food 05/05/2018   Non-toxic nodular goiter 08/31/2017   Reflux    Asthma    Sjogren syndrome with inflammatory arthritis (HCC)     PCP: Marcine Matar, MD  REFERRING PROVIDER: Raquel James, PA-C  REFERRING DIAG: Pain in right ankle and joints of right foot   THERAPY DIAG:  Pain in right ankle and joints of right foot  Muscle weakness (generalized)  Other abnormalities of gait and mobility  Localized edema  Rationale for Evaluation and Treatment: Rehabilitation  ONSET DATE: 10/07/2022   SUBJECTIVE:  SUBJECTIVE STATEMENT: Pt reports she has progressed to wt bearing as tolerated, but is not able to put full wt through her R foot.   EVAL: Patient reports arthritis of the right ankle that led up to her surgery on 10/07/2022. She is currently in a CAM boot and using a walker. She doesn't have the pain or swelling that she used to, but still with some soreness with walking. She uses a compression sock to help with her swelling and she can feel the screw that was put in the  back of the heel.   PERTINENT HISTORY: DOS: 10/07/2022 Right triple arthrodesis (subtalar joint, the talonavicular joint, and the calcaneocuboid joint) gastrocnemius recession peroneal tendon lengthening with tibial bone marrow aspirate  PAIN:  Are you having pain?  NPRS scale: 0/10, only incisional discomfort laterally Pain location: Right ankle  PRECAUTIONS: Fall  RED FLAGS: None   WEIGHT BEARING RESTRICTIONS: Yes per surgeon on 11/30/2022 PWB for 2 weeks in CAM book, then WBAT in CAM boot  FALLS:  Has patient fallen in last 6 months? Yes. Number of falls 3  LIVING ENVIRONMENT: Lives with: lives alone (currently living with mother) Lives in: House/apartment Stairs: Yes: External: 4 steps; can reach both  PLOF: Independent  PATIENT GOALS: Get back to walking without limitation    OBJECTIVE:  PATIENT SURVEYS:  FOTO 34% functional status  COGNITION: Overall cognitive status: Within functional limits for tasks assessed     SENSATION: WFL  EDEMA:  Figure 8: right 56 cm, left 52 cm  MUSCLE LENGTH: Calf flexibility deficit  PALPATION: Patient reports tenderness over incisions and portal sites  LOWER EXTREMITY ROM:  Active ROM Right eval Left eval Rt 12/23/22  Hip flexion     Hip extension     Hip abduction     Hip adduction     Hip internal rotation     Hip external rotation     Knee flexion     Knee extension     Ankle dorsiflexion Lacking 5  6d  Ankle plantarflexion 30    Ankle inversion 0    Ankle eversion 0     (Blank rows = not tested)  LOWER EXTREMITY MMT:  MMT Right eval Left eval  Hip flexion    Hip extension    Hip abduction    Hip adduction    Hip internal rotation    Hip external rotation    Knee flexion    Knee extension    Ankle dorsiflexion 4   Ankle plantarflexion -   Ankle inversion -   Ankle eversion -    (Blank rows = not tested)  FUNCTIONAL TESTS:  Not assessed  GAIT: Distance walked: 50 ft Assistive device  utilized: Walker - 2 wheeled Level of assistance: Modified independence Comments: Step-to gait pattern with PWB on right   TODAY'S TREATMENT:   [facility] Adult PT Treatment:                                                DATE: 12/23/22 Therapeutic Exercise:  Longsitting calf stretch 4 x 20 sec Longsitting ankle PF with green 2x10 Seated figure-4 ankle PF/DF and toe PROM x 10 5" Seated heel toe raises/heel raises 2x 10 Seated towel scrunches 2x10 Seated arch lifts x10 Seated ankle Inv/Ev windshield wipers 2x10  Updated HEP  [facility] Adult PT Treatment:                                                DATE: 12/09/2022 Therapeutic Exercise: Longsitting calf stretch 2 x 20 sec Longsitting ankle PF with green x 10 Seated figure-4 ankle PF/DF and toe PROM Seated heel toe raises x 10 Seated towel scrunches x 10  PATIENT EDUCATION:  Education details: Exam findings, POC, HEP, instruction on PWB, continued elevation and icing Person educated: Patient Education method: Explanation, Demonstration, Tactile cues, Verbal cues, and Handouts Education comprehension: verbalized understanding, returned demonstration, verbal cues required, tactile cues required, and needs further education  HOME EXERCISE PROGRAM: Access Code: [facility] URL: [email or web address] Date: 12/23/2022 Prepared by: Joellyn Rued  Exercises - Long Sitting Calf Stretch with Strap  - 2-3 x daily - 3 reps - 20 seconds hold - Long Sitting Ankle Plantar Flexion with Resistance  - 1 x daily - 3 sets - 10 reps - Seated Ankle Dorsiflexion with Resistance  - 1 x daily - 7 x weekly - 3 sets - 10 reps - Seated Ankle Plantarflexion Dorsiflexion PROM  - 2-3 x daily - 10 reps - 5 seconds hold - Seated Toe Flexion Extension PROM  - 2-3 x daily - 10 reps - 5 seconds hold - Seated Heel Toe Raises  - 1 x daily - 2 sets - 20 reps - Seated Toe Towel Scrunches  - 1 x daily - 2 sets - 20 reps - Arch Lifting  - 2 x daily - 7 x weekly - 20  reps  ASSESSMENT: CLINICAL IMPRESSION: Pt presents to PT walking c RW and cam boot. Pt reports she has increased the wt bearing through the R foot as she can tolerate. PT was completed for for R ankle/foot ROM and strengthening with progression of her therex/HEP. Pt returned proper demonstration of her HEP. AROM for R ankle DF has increased since the eval. Pt tolerated the prescribed therex without adverse effects. Pt will continue to benefit from skilled PT to address impairments for improved R ankle/foot function.     EVAL: Patient is a 67 y.o. female who was seen today for physical therapy evaluation and treatment for right ankle pain and stiffness following surgery on 10/07/2022. She does exhibit limitations in her right ankle motion and strength, as well as increased ankle edema and is limited with walking due to weight bearing restrictions and use of CAM boot She had fusion of subtalar joint so expect significant limitations with inversion/eversion while she still has use of talocrural joint so will focus on improving ankle dorsiflexon and plantarflexion mobility and strengthening until weight bearing restrictions removed.  OBJECTIVE IMPAIRMENTS: Abnormal gait, decreased activity tolerance, decreased balance, difficulty walking, decreased ROM, decreased strength, increased edema, impaired flexibility, and pain.   ACTIVITY LIMITATIONS: standing, squatting, stairs, transfers, bathing, dressing, and locomotion level  PARTICIPATION LIMITATIONS: meal prep, cleaning, laundry, driving, shopping, and community activity  PERSONAL FACTORS: Fitness, Past/current experiences, and Time since onset of injury/illness/exacerbation are also affecting patient's functional outcome.   REHAB POTENTIAL: Good  CLINICAL DECISION  MAKING: Stable/uncomplicated  EVALUATION COMPLEXITY: Low   GOALS: Goals reviewed with patient? Yes  SHORT TERM GOALS: Target date: 01/06/2023  Patient will be I with initial HEP in  order to progress with therapy. Baseline: HEP provided at eval Goal status: Ongoing  2.  Patient will demonstrate normalized gait mechanics with LRAD in order to improve mobility and community access Baseline: see limitations above Goal status: INITIAL  3.  Patient will demonstrate >/= 5 deg right ankle DF in order to normalize gait Baseline: lacking 5 deg 12/23/22: 6d Goal status: MET  LONG TERM GOALS: Target date: 02/03/2023  Patient will be I with final HEP to maintain progress from PT. Baseline: HEP provided at eval Goal status: INITIAL  2.  Patient will report >/= 52% status on FOTO to indicate improved functional ability. Baseline: 34% functional status Goal status: INITIAL  3.  Patient will demonstrate right ankle DF/PF strength >/= 4/5 MMT in order to improve gait and weight bearing tolerance Baseline: see limitations above Goal status: INITIAL  4.  Patient will report no limitations with community ambulation using LRAD in order to maximize her level of function Baseline: limited with household mobility Goal status: INITIAL   PLAN: PT FREQUENCY: 1-2x/week  PT DURATION: 8 weeks  PLANNED INTERVENTIONS: Therapeutic exercises, Therapeutic activity, Neuromuscular re-education, Balance training, Gait training, Patient/Family education, Self Care, Joint mobilization, Aquatic Therapy, Dry Needling, Cryotherapy, Moist heat, scar mobilization, Taping, Vasopneumatic device, Manual therapy, and Re-evaluation  PLAN FOR NEXT SESSION: Review HEP and progress PRN, right ankle stretching for DF/PF, ankle and foot intrinsic strengthening, gait training and progression to WBAT as allowed, balance and ankle stability training  Joellyn Rued MS, PT 12/23/22 3:37 PM  Date of referral: 12/06/2022 Referring provider: Raquel James, PA-C Referring diagnosis? M25.571 (ICD-10-CM) - Pain in right ankle and joints of right foot  Treatment diagnosis? (if different than referring diagnosis)  M25.571 (ICD-10-CM) - Pain in right ankle and joints of right foot  What was this (referring dx) caused by? [x]  Surgery (Type: _____Right triple arthrodesis (subtalar joint, the talonavicular joint, and the calcaneocuboid joint) gastrocnemius recession peroneal tendon lengthening with tibial bone marrow aspirate ____) []  Fall []  Ongoing issue []  Arthritis []  Unspecified []  Work related []  Librarian, academic []  Other: ____________  Ashby Dawes of Condition  []  Initial Onset (within last 3 months)  []  Recurrent (multiple episodes of < 3 months)  [x]  Chronic (continuous duration > 3 months)  Laterality: [x]  Rt []  Lt []  Both  Current Functional Measure Score  [x]  FOTO ____34% functional status ____ []  Neck Index ______  []  Back Index ______  []  DASH _______  []  LEFS________  Briefly describe symptoms: Right ankle pain, weakness, stiffness  How did symptoms start: Surgery  Average pain intensity:  Last 24 hours: 0  Past week: 2  How often does the pt experience symptoms?  []  Constantly []  Frequently [x]  Occasionally []  Intermittently  How much have the symptoms interfered with usual daily activities?  []  Not at all  []  A little bit  []  Moderately  [x]  Quite a bit  []  Extremely  How has condition changed since care began at this facility?  [x]  NA - iniital visit  []  Much worse  []  Worse  []  A little worse  []  No change  []  A little better  []  Better  []  Much better  In general, how is the patients overall health?  []  Excellend  []  Very good  [x]  Good  []  Fair  []   Poor

## 2022-12-27 ENCOUNTER — Ambulatory Visit: Payer: Medicare Other | Admitting: Physical Therapy

## 2022-12-27 ENCOUNTER — Ambulatory Visit: Payer: Medicare Other

## 2022-12-27 NOTE — Therapy (Signed)
OUTPATIENT PHYSICAL THERAPY TREATMENT NOTE   Patient Name: [name] MRN: [id number] DOB:1956/04/08, 67 y.o., female Today's Date: 12/28/2022   END OF SESSION:  PT End of Session - 12/28/22 1121     Visit Number 3    Number of Visits 17    Date for PT Re-Evaluation 02/03/23    Authorization Type [employer] MCR    Progress Note Due on Visit 10    PT Start Time 1107    PT Stop Time 1147    PT Time Calculation (min) 40 min    Activity Tolerance Patient tolerated treatment well    Behavior During Therapy WFL for tasks assessed/performed               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   HTN (hypertension)    Joint pain    Lactose intolerance    Multiple food allergies    OA (osteoarthritis)    Reflux    Sjogren's syndrome (HCC)    SOB (shortness of breath)    Swallowing difficulty    Thyroid condition    Vitamin D deficiency    Past Surgical History:  Procedure Laterality Date   APPENDECTOMY     BACK SURGERY  03/12/2010   L5   BREAST SURGERY     BIOPSY   HERNIA REPAIR     groin bilateral   NECK SURGERY     PELVIC LAPAROSCOPY  4098,1191   PILONIDAL CYST EXCISION     SALPINGOOPHORECTOMY  10/11/2002   SCOPE LSO-LEFT SEROUS CYSTADENOMA   SALPINGOOPHORECTOMY  04/12/2005   RSO/TOA   SHOULDER SURGERY Right 01/2018   TONSILLECTOMY AND ADENOIDECTOMY     TUBAL LIGATION     VAGINAL HYSTERECTOMY  01/10/2005   TVH, POSTERIOR REPAIR   Patient Active Problem List   Diagnosis Date Noted   Dry mouth 06/24/2022   Joint swelling 06/24/2022   Other specified abnormal immunological findings in serum 06/24/2022   Pain in limb 06/24/2022   Insulin resistance 05/19/2022   Low mean corpuscular hemoglobin concentration (MCHC) 05/19/2022   Other fatigue 05/05/2022   SOBOE (shortness of  breath on exertion) 05/05/2022   Right foot pain 05/05/2022   Depression screen 05/05/2022   Arthritis 03/23/2022   Hyperlipidemia associated with type 2 diabetes mellitus (HCC) 12/24/2021   Essential hypertension 10/21/2021   Type 2 diabetes mellitus with hypoglycemia without coma, without long-term current use of insulin (HCC) 10/21/2021   Primary osteoarthritis involving multiple joints 10/21/2021   Mild intermittent asthma without complication 10/21/2021   History of herpes genitalis 10/21/2021   History of gout 10/21/2021   Gastroesophageal reflux disease without esophagitis 10/21/2021   Morbid obesity (HCC) with starting BMI 35 10/21/2021   Persistent cough 04/20/2021   Chronic vertigo 12/04/2020   COVID-19 04/16/2020   Neuropathic pain 04/12/2020   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   Gout 12/19/2019   Carotid artery stenosis 11/24/2019   Multiple joint pain 11/24/2019   Allergy to food 05/05/2018   Non-toxic nodular goiter 08/31/2017   Reflux    Asthma    Sjogren syndrome with inflammatory arthritis (HCC)     PCP: Marcine Matar, MD  REFERRING PROVIDER: Raquel James, PA-C  REFERRING DIAG: Pain in right ankle and joints of right foot   THERAPY DIAG:  Pain in right ankle and joints of right foot  Muscle weakness (generalized)  Other abnormalities of gait and mobility  Localized edema  Rationale for Evaluation and Treatment: Rehabilitation  ONSET DATE: 10/07/2022   SUBJECTIVE:  SUBJECTIVE STATEMENT: R foot is a little stiff today. The cam boot seems to bother my R knee. R foot/ankle continues to gradually feel better. No R heel pain. Sees surgeon today.  EVAL: Patient reports arthritis of the right ankle that led up to her surgery on 10/07/2022. She is currently in a CAM boot and using a walker. She doesn't have the pain or swelling that she used to, but still with some soreness with walking. She uses a compression sock to help with her swelling  and she can feel the screw that was put in the back of the heel.   PERTINENT HISTORY: DOS: 10/07/2022 Right triple arthrodesis (subtalar joint, the talonavicular joint, and the calcaneocuboid joint) gastrocnemius recession peroneal tendon lengthening with tibial bone marrow aspirate  PAIN:  Are you having pain?  NPRS scale: 1/10, stifness Pain location: Right ankle  PRECAUTIONS: Fall  RED FLAGS: None   WEIGHT BEARING RESTRICTIONS: Yes per surgeon on 11/30/2022 PWB for 2 weeks in CAM book, then WBAT in CAM boot  FALLS:  Has patient fallen in last 6 months? Yes. Number of falls 3  LIVING ENVIRONMENT: Lives with: lives alone (currently living with mother) Lives in: House/apartment Stairs: Yes: External: 4 steps; can reach both  PLOF: Independent  PATIENT GOALS: Get back to walking without limitation    OBJECTIVE:  PATIENT SURVEYS:  FOTO 34% functional status  COGNITION: Overall cognitive status: Within functional limits for tasks assessed     SENSATION: WFL  EDEMA:  Figure 8: right 56 cm, left 52 cm  MUSCLE LENGTH: Calf flexibility deficit  PALPATION: Patient reports tenderness over incisions and portal sites  LOWER EXTREMITY ROM:  Active ROM Right eval Left eval Rt 12/23/22 RT 12/28/22  Hip flexion      Hip extension      Hip abduction      Hip adduction      Hip internal rotation      Hip external rotation      Knee flexion      Knee extension      Ankle dorsiflexion Lacking 5  6d 10d  Ankle plantarflexion 30   30d  Ankle inversion 0     Ankle eversion 0      (Blank rows = not tested)  LOWER EXTREMITY MMT:  MMT Right eval Left eval  Hip flexion    Hip extension    Hip abduction    Hip adduction    Hip internal rotation    Hip external rotation    Knee flexion    Knee extension    Ankle dorsiflexion 4   Ankle plantarflexion -   Ankle inversion -   Ankle eversion -    (Blank rows = not tested)  FUNCTIONAL TESTS:  Not  assessed  GAIT: Distance walked: 50 ft Assistive device utilized: Walker - 2 wheeled Level of assistance: Modified independence Comments: Step-to gait pattern with PWB on right   TODAY'S TREATMENT: [facility] Adult PT Treatment:  DATE: 12/28/22 Therapeutic Exercise: Longsitting calf stretch 3 x 30 sec Longsitting ankle PF with green 2x10 3" Seated ankle DF with green 2x10 3" Seated figure-4 ankle PF/DF and toe PROM x 10 5" Seated heel toe raises/heel raises 2x 10 Seated towel scrunches and arch lifts 2x10 Seated ankle Inv/Ev windshield wipers 2x10  Updated HEP   [facility] Adult PT Treatment:                                                DATE: 12/23/22 Therapeutic Exercise: Longsitting calf stretch 4 x 20 sec Longsitting ankle PF with green 2x10 Seated figure-4 ankle PF/DF and toe PROM x 5 10" Seated heel toe raises/heel raises 2x 10 Seated towel scrunches 2x10 Seated arch lifts x10 Seated ankle Inv/Ev windshield wipers 2x10  Updated HEP  [facility] Adult PT Treatment:                                                DATE: 12/09/2022 Therapeutic Exercise: Longsitting calf stretch 2 x 20 sec Longsitting ankle PF with green x 10 Seated figure-4 ankle PF/DF and toe PROM Seated heel toe raises x 10 Seated towel scrunches x 10  PATIENT EDUCATION:  Education details: Exam findings, POC, HEP, instruction on PWB, continued elevation and icing Person educated: Patient Education method: Explanation, Demonstration, Tactile cues, Verbal cues, and Handouts Education comprehension: verbalized understanding, returned demonstration, verbal cues required, tactile cues required, and needs further education  HOME EXERCISE PROGRAM: Access Code: [facility] URL: [email or web address] Date: 12/23/2022 Prepared by: Joellyn Rued  Exercises - Long Sitting Calf Stretch with Strap  - 2-3 x daily - 3 reps - 20 seconds hold - Long Sitting Ankle Plantar  Flexion with Resistance  - 1 x daily - 3 sets - 10 reps - Seated Ankle Dorsiflexion with Resistance  - 1 x daily - 7 x weekly - 3 sets - 10 reps - Seated Ankle Plantarflexion Dorsiflexion PROM  - 2-3 x daily - 10 reps - 5 seconds hold - Seated Toe Flexion Extension PROM  - 2-3 x daily - 10 reps - 5 seconds hold - Seated Heel Toe Raises  - 1 x daily - 2 sets - 20 reps - Seated Toe Towel Scrunches  - 1 x daily - 2 sets - 20 reps - Arch Lifting  - 2 x daily - 7 x weekly - 20 reps - Seated ankle DF c green band 2x10  ASSESSMENT: CLINICAL IMPRESSION: Pt presents to PT walking c RW and cam boot. PT was completed for for R ankle/foot ROM and strengthening with progression of her therex/HEP. Resisted ankle DF was initiated today. Pt returned proper demonstration of her HEP. AROM for R ankle DF continues to increase, while AROM for PF remains the same. Pt tolerated the prescribed therex without adverse effects. Pt will continue to benefit from skilled PT to address impairments for improved R ankle/foot function.     EVAL: Patient is a 67 y.o. female who was seen today for physical therapy evaluation and treatment for right ankle pain and stiffness following surgery on 10/07/2022. She does exhibit limitations in her right ankle motion and strength, as well as increased ankle edema and is limited with walking due to  weight bearing restrictions and use of CAM boot She had fusion of subtalar joint so expect significant limitations with inversion/eversion while she still has use of talocrural joint so will focus on improving ankle dorsiflexon and plantarflexion mobility and strengthening until weight bearing restrictions removed.  OBJECTIVE IMPAIRMENTS: Abnormal gait, decreased activity tolerance, decreased balance, difficulty walking, decreased ROM, decreased strength, increased edema, impaired flexibility, and pain.   ACTIVITY LIMITATIONS: standing, squatting, stairs, transfers, bathing, dressing, and locomotion  level  PARTICIPATION LIMITATIONS: meal prep, cleaning, laundry, driving, shopping, and community activity  PERSONAL FACTORS: Fitness, Past/current experiences, and Time since onset of injury/illness/exacerbation are also affecting patient's functional outcome.   REHAB POTENTIAL: Good  CLINICAL DECISION MAKING: Stable/uncomplicated  EVALUATION COMPLEXITY: Low   GOALS: Goals reviewed with patient? Yes  SHORT TERM GOALS: Target date: 01/06/2023  Patient will be I with initial HEP in order to progress with therapy. Baseline: HEP provided at eval Goal status: Ongoing  2.  Patient will demonstrate normalized gait mechanics with LRAD in order to improve mobility and community access Baseline: see limitations above Goal status: INITIAL  3.  Patient will demonstrate >/= 5 deg right ankle DF in order to normalize gait Baseline: lacking 5 deg 12/23/22: 6d Goal status: MET  LONG TERM GOALS: Target date: 02/03/2023  Patient will be I with final HEP to maintain progress from PT. Baseline: HEP provided at eval Goal status: INITIAL  2.  Patient will report >/= 52% status on FOTO to indicate improved functional ability. Baseline: 34% functional status Goal status: INITIAL  3.  Patient will demonstrate right ankle DF/PF strength >/= 4/5 MMT in order to improve gait and weight bearing tolerance Baseline: see limitations above Goal status: INITIAL  4.  Patient will report no limitations with community ambulation using LRAD in order to maximize her level of function Baseline: limited with household mobility Goal status: INITIAL   PLAN: PT FREQUENCY: 1-2x/week  PT DURATION: 8 weeks  PLANNED INTERVENTIONS: Therapeutic exercises, Therapeutic activity, Neuromuscular re-education, Balance training, Gait training, Patient/Family education, Self Care, Joint mobilization, Aquatic Therapy, Dry Needling, Cryotherapy, Moist heat, scar mobilization, Taping, Vasopneumatic device, Manual therapy,  and Re-evaluation  PLAN FOR NEXT SESSION: Review HEP and progress PRN, right ankle stretching for DF/PF, ankle and foot intrinsic strengthening, gait training and progression to WBAT as allowed, balance and ankle stability training  Joellyn Rued MS, PT 12/28/22 11:51 AM  Date of referral: 12/06/2022 Referring provider: Raquel James, PA-C Referring diagnosis? M25.571 (ICD-10-CM) - Pain in right ankle and joints of right foot  Treatment diagnosis? (if different than referring diagnosis) M25.571 (ICD-10-CM) - Pain in right ankle and joints of right foot  What was this (referring dx) caused by? [x]  Surgery (Type: _____Right triple arthrodesis (subtalar joint, the talonavicular joint, and the calcaneocuboid joint) gastrocnemius recession peroneal tendon lengthening with tibial bone marrow aspirate ____) []  Fall []  Ongoing issue []  Arthritis []  Unspecified []  Work related []  Librarian, academic []  Other: ____________  Ashby Dawes of Condition  []  Initial Onset (within last 3 months)  []  Recurrent (multiple episodes of < 3 months)  [x]  Chronic (continuous duration > 3 months)  Laterality: [x]  Rt []  Lt []  Both  Current Functional Measure Score  [x]  FOTO ____34% functional status ____ []  Neck Index ______  []  Back Index ______  []  DASH _______  []  LEFS________  Briefly describe symptoms: Right ankle pain, weakness, stiffness  How did symptoms start: Surgery  Average pain intensity:  Last 24 hours: 0  Past week:  2  How often does the pt experience symptoms?  []  Constantly []  Frequently [x]  Occasionally []  Intermittently  How much have the symptoms interfered with usual daily activities?  []  Not at all  []  A little bit  []  Moderately  [x]  Quite a bit  []  Extremely  How has condition changed since care began at this facility?  [x]  NA - iniital visit  []  Much worse  []  Worse  []  A little worse  []  No change  []  A little better  []  Better  []  Much better  In general, how  is the patients overall health?  []  Excellend  []  Very good  [x]  Good  []  Fair  []  Poor

## 2022-12-28 ENCOUNTER — Ambulatory Visit: Payer: Medicare Other

## 2022-12-28 DIAGNOSIS — R600 Localized edema: Secondary | ICD-10-CM

## 2022-12-28 DIAGNOSIS — R2689 Other abnormalities of gait and mobility: Secondary | ICD-10-CM | POA: Diagnosis not present

## 2022-12-28 DIAGNOSIS — M6281 Muscle weakness (generalized): Secondary | ICD-10-CM

## 2022-12-28 DIAGNOSIS — M25571 Pain in right ankle and joints of right foot: Secondary | ICD-10-CM | POA: Diagnosis not present

## 2022-12-28 DIAGNOSIS — M79671 Pain in right foot: Secondary | ICD-10-CM | POA: Diagnosis not present

## 2022-12-30 ENCOUNTER — Encounter: Payer: Self-pay | Admitting: Physical Therapy

## 2022-12-30 ENCOUNTER — Other Ambulatory Visit: Payer: Self-pay

## 2022-12-30 ENCOUNTER — Ambulatory Visit: Payer: Medicare Other | Admitting: Physical Therapy

## 2022-12-30 DIAGNOSIS — M25571 Pain in right ankle and joints of right foot: Secondary | ICD-10-CM | POA: Diagnosis not present

## 2022-12-30 DIAGNOSIS — R2689 Other abnormalities of gait and mobility: Secondary | ICD-10-CM

## 2022-12-30 DIAGNOSIS — M6281 Muscle weakness (generalized): Secondary | ICD-10-CM | POA: Diagnosis not present

## 2022-12-30 DIAGNOSIS — R600 Localized edema: Secondary | ICD-10-CM

## 2022-12-30 NOTE — Therapy (Signed)
OUTPATIENT PHYSICAL THERAPY TREATMENT NOTE   Patient Name: [name] MRN: [id number] DOB:1956-04-04, 67 y.o., female Today's Date: 12/30/2022   END OF SESSION:  PT End of Session - 12/30/22 0950     Visit Number 4    Number of Visits 17    Date for PT Re-Evaluation 02/03/23    Authorization Type [employer] MCR    Authorization Time Period 12/23/2022 - 02/03/2023    Authorization - Visit Number 3    Authorization - Number of Visits 12    Progress Note Due on Visit 10    PT Start Time 0943    PT Stop Time 1030    PT Time Calculation (min) 47 min    Activity Tolerance Patient tolerated treatment well    Behavior During Therapy [facility] for tasks assessed/performed                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   HTN (hypertension)    Joint pain    Lactose intolerance    Multiple food allergies    OA (osteoarthritis)    Reflux    Sjogren's syndrome (HCC)    SOB (shortness of breath)    Swallowing difficulty    Thyroid condition    Vitamin D deficiency    Past Surgical History:  Procedure Laterality Date   APPENDECTOMY     BACK SURGERY  03/12/2010   L5   BREAST SURGERY     BIOPSY   HERNIA REPAIR     groin bilateral   NECK SURGERY     PELVIC LAPAROSCOPY  1324,4010   PILONIDAL CYST EXCISION     SALPINGOOPHORECTOMY  10/11/2002   SCOPE LSO-LEFT SEROUS CYSTADENOMA   SALPINGOOPHORECTOMY  04/12/2005   RSO/TOA   SHOULDER SURGERY Right 01/2018   TONSILLECTOMY AND ADENOIDECTOMY     TUBAL LIGATION     VAGINAL HYSTERECTOMY  01/10/2005   TVH, POSTERIOR REPAIR   Patient Active Problem List   Diagnosis Date Noted   Dry mouth 06/24/2022   Joint swelling 06/24/2022   Other specified abnormal immunological findings in serum 06/24/2022   Pain in limb 06/24/2022   Insulin resistance  05/19/2022   Low mean corpuscular hemoglobin concentration (MCHC) 05/19/2022   Other fatigue 05/05/2022   SOBOE (shortness of breath on exertion) 05/05/2022   Right foot pain 05/05/2022   Depression screen 05/05/2022   Arthritis 03/23/2022   Hyperlipidemia associated with type 2 diabetes mellitus (HCC) 12/24/2021   Essential hypertension 10/21/2021   Type 2 diabetes mellitus with hypoglycemia without coma, without long-term current use of insulin (HCC) 10/21/2021   Primary osteoarthritis involving multiple joints 10/21/2021   Mild intermittent asthma without complication 10/21/2021   History of herpes genitalis 10/21/2021   History of gout 10/21/2021   Gastroesophageal reflux disease without esophagitis 10/21/2021   Morbid obesity (HCC) with starting BMI 35 10/21/2021   Persistent cough 04/20/2021   Chronic vertigo 12/04/2020   COVID-19 04/16/2020   Neuropathic pain 04/12/2020   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   Gout 12/19/2019   Carotid artery stenosis 11/24/2019   Multiple joint pain 11/24/2019   Allergy to food 05/05/2018   Non-toxic nodular goiter 08/31/2017   Reflux  Asthma    Sjogren syndrome with inflammatory arthritis (HCC)     PCP: Marcine Matar, MD  REFERRING PROVIDER: Raquel James, PA-C   REFERRING DIAG: Pain in right ankle and joints of right foot   THERAPY DIAG:  Pain in right ankle and joints of right foot  Muscle weakness (generalized)  Other abnormalities of gait and mobility  Localized edema  Rationale for Evaluation and Treatment: Rehabilitation  ONSET DATE: 10/07/2022   SUBJECTIVE:  SUBJECTIVE STATEMENT: Patient arrives with ASO brace in normal shoe. She states she is having a little more soreness with walking and it feels like she is walking on a "stump."   PERTINENT HISTORY: DOS: 10/07/2022 Right triple arthrodesis (subtalar joint, the talonavicular joint, and the calcaneocuboid joint) gastrocnemius recession peroneal  tendon lengthening with tibial bone marrow aspirate  PAIN:  Are you having pain?  NPRS scale: 1/10, sore Pain location: Right ankle  PRECAUTIONS: Fall  WEIGHT BEARING RESTRICTIONS: WBAT in ASO brace per surgeon 12/28/2022  PATIENT GOALS: Get back to walking without limitation    OBJECTIVE:  PATIENT SURVEYS:  FOTO 34% functional status  EDEMA:  Figure 8: right 56 cm, left 52 cm  MUSCLE LENGTH: Calf flexibility deficit  PALPATION: Patient reports tenderness over incisions and portal sites  LOWER EXTREMITY ROM:  Active ROM Right eval Left eval Rt 12/23/22 RT 12/28/22  Hip flexion      Hip extension      Hip abduction      Hip adduction      Hip internal rotation      Hip external rotation      Knee flexion      Knee extension      Ankle dorsiflexion Lacking 5  6d 10d  Ankle plantarflexion 30   30d  Ankle inversion 0     Ankle eversion 0      (Blank rows = not tested)  LOWER EXTREMITY MMT:  MMT Right eval Left eval  Hip flexion    Hip extension    Hip abduction    Hip adduction    Hip internal rotation    Hip external rotation    Knee flexion    Knee extension    Ankle dorsiflexion 4   Ankle plantarflexion -   Ankle inversion -   Ankle eversion -    (Blank rows = not tested)  FUNCTIONAL TESTS:  Not assessed  GAIT: Distance walked: 50 ft Assistive device utilized: Environmental consultant - 2 wheeled Level of assistance: Modified independence Comments: Step-to gait pattern with PWB on right   TODAY'S TREATMENT: [facility] Adult PT Treatment:                                                DATE: 12/30/22 Therapeutic Exercise: NuStep L6 x 5 min with UE/LE to work on ankle mobility Longsitting calf and plantar fascia stretch with towel 3 x 30 sec Longsitting ankle PF with blue 3 x 10 Manual: Right talocrural distraction and A/P mobs to improve ankle motion STM right calf and scar tissue massage Passive right ankle PF/DF Modalities: Vasopneumatic (Game  Ready): Location: Right ankle Time: 15 min Temperature: 34 deg Pressure: Medium   [facility] Adult PT Treatment:  DATE: 12/28/22 Therapeutic Exercise: Longsitting calf stretch 3 x 30 sec Longsitting ankle PF with green 2x10 3" Seated ankle DF with green 2x10 3" Seated figure-4 ankle PF/DF and toe PROM x 10 5" Seated heel toe raises/heel raises 2x 10 Seated towel scrunches and arch lifts 2x10 Seated ankle Inv/Ev windshield wipers 2x10  Updated HEP    [facility] Adult PT Treatment:                                                DATE: 12/23/22 Therapeutic Exercise: Longsitting calf stretch 4 x 20 sec Longsitting ankle PF with green 2x10 Seated figure-4 ankle PF/DF and toe PROM x 5 10" Seated heel toe raises/heel raises 2x 10 Seated towel scrunches 2x10 Seated arch lifts x10 Seated ankle Inv/Ev windshield wipers 2x10  Updated HEP  PATIENT EDUCATION:  Education details: HEP Person educated: Patient Education method: Programmer, multimedia, Facilities manager, Actor cues, Verbal cues Education comprehension: verbalized understanding, returned demonstration, verbal cues required, tactile cues required, and needs further education  HOME EXERCISE PROGRAM: Access Code: [email or web address]   ASSESSMENT: CLINICAL IMPRESSION: Patient tolerated therapy well with no adverse effects. Therapy limited due to patient arriving late. She is currently ambulating without assistive device and with ASO brace on right ankle. She does report a slight increase in ankle soreness coming out of the CAM boot. Therapy focused on manual therapy to improve ankle mobility and motion, and progressing calf strengthening in greater resistance this visit. No changes to HEP this visit but patient was provided blue band to progress her strengthening for home. Concluded therapy with vaso for swelling and soreness management. Patient would benefit from continued skilled PT to progress her mobility and strength  in order to reduce pain and maximize functional ability.    OBJECTIVE IMPAIRMENTS: Abnormal gait, decreased activity tolerance, decreased balance, difficulty walking, decreased ROM, decreased strength, increased edema, impaired flexibility, and pain.   ACTIVITY LIMITATIONS: standing, squatting, stairs, transfers, bathing, dressing, and locomotion level  PARTICIPATION LIMITATIONS: meal prep, cleaning, laundry, driving, shopping, and community activity  PERSONAL FACTORS: Fitness, Past/current experiences, and Time since onset of injury/illness/exacerbation are also affecting patient's functional outcome.    GOALS: Goals reviewed with patient? Yes  SHORT TERM GOALS: Target date: 01/06/2023  Patient will be I with initial HEP in order to progress with therapy. Baseline: HEP provided at eval Goal status: Ongoing  2.  Patient will demonstrate normalized gait mechanics with LRAD in order to improve mobility and community access Baseline: see limitations above Goal status: INITIAL  3.  Patient will demonstrate >/= 5 deg right ankle DF in order to normalize gait Baseline: lacking 5 deg 12/23/22: 6d Goal status: MET  LONG TERM GOALS: Target date: 02/03/2023  Patient will be I with final HEP to maintain progress from PT. Baseline: HEP provided at eval Goal status: INITIAL  2.  Patient will report >/= 52% status on FOTO to indicate improved functional ability. Baseline: 34% functional status Goal status: INITIAL  3.  Patient will demonstrate right ankle DF/PF strength >/= 4/5 MMT in order to improve gait and weight bearing tolerance Baseline: see limitations above Goal status: INITIAL  4.  Patient will report no limitations with community ambulation using LRAD in order to maximize her level of function Baseline: limited with household mobility Goal status: INITIAL   PLAN: PT FREQUENCY: 1-2x/week  PT DURATION: 8 weeks  PLANNED INTERVENTIONS: Therapeutic exercises, Therapeutic  activity, Neuromuscular re-education, Balance training, Gait training, Patient/Family education, Self Care, Joint mobilization, Aquatic Therapy, Dry Needling, Cryotherapy, Moist heat, scar mobilization, Taping, Vasopneumatic device, Manual therapy, and Re-evaluation  PLAN FOR NEXT SESSION: Review HEP and progress PRN, right ankle stretching for DF/PF, ankle and foot intrinsic strengthening, gait training and progression to WBAT as allowed, balance and ankle stability training   Rosana Hoes, PT, DPT, LAT, ATC 12/30/22  11:37 AM Phone: [phone] Fax: [phone]

## 2022-12-31 ENCOUNTER — Encounter: Payer: Self-pay | Admitting: Internal Medicine

## 2022-12-31 ENCOUNTER — Ambulatory Visit: Payer: Medicare Other | Attending: Internal Medicine | Admitting: Internal Medicine

## 2022-12-31 VITALS — BP 101/68 | HR 65 | Ht 66.0 in | Wt 210.0 lb

## 2022-12-31 DIAGNOSIS — Z1231 Encounter for screening mammogram for malignant neoplasm of breast: Secondary | ICD-10-CM

## 2022-12-31 DIAGNOSIS — R252 Cramp and spasm: Secondary | ICD-10-CM | POA: Diagnosis not present

## 2022-12-31 DIAGNOSIS — M130 Polyarthritis, unspecified: Secondary | ICD-10-CM

## 2022-12-31 DIAGNOSIS — E669 Obesity, unspecified: Secondary | ICD-10-CM

## 2022-12-31 DIAGNOSIS — I152 Hypertension secondary to endocrine disorders: Secondary | ICD-10-CM

## 2022-12-31 DIAGNOSIS — E1169 Type 2 diabetes mellitus with other specified complication: Secondary | ICD-10-CM

## 2022-12-31 DIAGNOSIS — Z6833 Body mass index (BMI) 33.0-33.9, adult: Secondary | ICD-10-CM

## 2022-12-31 DIAGNOSIS — E1159 Type 2 diabetes mellitus with other circulatory complications: Secondary | ICD-10-CM | POA: Diagnosis not present

## 2022-12-31 DIAGNOSIS — Z87898 Personal history of other specified conditions: Secondary | ICD-10-CM | POA: Diagnosis not present

## 2022-12-31 DIAGNOSIS — E785 Hyperlipidemia, unspecified: Secondary | ICD-10-CM | POA: Diagnosis not present

## 2022-12-31 DIAGNOSIS — Z23 Encounter for immunization: Secondary | ICD-10-CM | POA: Diagnosis not present

## 2022-12-31 LAB — POCT GLYCOSYLATED HEMOGLOBIN (HGB A1C): HbA1c, POC (controlled diabetic range): 5.9 % (ref 0.0–7.0)

## 2022-12-31 LAB — GLUCOSE, POCT (MANUAL RESULT ENTRY): POC Glucose: 93 mg/dl (ref 70–99)

## 2022-12-31 MED ORDER — MECLIZINE HCL 25 MG PO TABS
25.0000 mg | ORAL_TABLET | Freq: Two times a day (BID) | ORAL | 1 refills | Status: DC | PRN
Start: 2022-12-31 — End: 2024-02-03

## 2022-12-31 MED ORDER — FREESTYLE LIBRE 2 SENSOR MISC: 6 refills | Status: DC

## 2022-12-31 MED ORDER — CYCLOBENZAPRINE HCL 10 MG PO TABS: 10.0000 mg | ORAL_TABLET | Freq: Three times a day (TID) | ORAL | 3 refills | Status: DC | PRN

## 2022-12-31 NOTE — Progress Notes (Signed)
Patient ID: [name], female    DOB: Nov 06, 1955  MRN: [id number]  CC: Diabetes (DM & HTN f/u. Valentino Hue to flu vax. Requesting mammogram referral)   Subjective: [name] is a 67 y.o. female who presents for chronic ds management. Her concerns today include:  Patient with history of DM type II, HTN, HL, Sjogren syndrome, Raynaud's phenomenon, OA knees/ankles/shoulders/hips, HSV 2, asthma, gout, thyroid nodules followed by Dr. Gerrit Friends, genital herpes, GERD, vaginal wall prolapse, OAB   DM: Results for orders placed or performed in visit on 12/31/22  POCT glycosylated hemoglobin (Hb A1C)  Result Value Ref Range   Hemoglobin A1C     HbA1c POC (<> result, manual entry)     HbA1c, POC (prediabetic range)     HbA1c, POC (controlled diabetic range) 5.9 0.0 - 7.0 %  POCT glucose (manual entry)  Result Value Ref Range   POC Glucose 93 70 - 99 mg/dl  Remains diet controlled. Was going to [facility] but held off going because she was in cast for her RT ankle.  She was 205 lbs before surgery.  Plans to get back in with them once she is done with P.T. Doing good with eating habits.  Not moving as much. Had RT ankle surgery since last visit.  Boot just came off this wk. Has 4 more wks of P.T, going 2x/wk Request RF on CGM sensor.  BS dropped twice since surgery.  Drinks OJ to get it back up -due for eye exam  HTN: Reports compliance with hydrochlorothiazide 25 mg twice a day and nebivolol 5 mg daily.  Checks BP only if she has HA or feels dizzy.   Limits salt No CP/SOB.    HL: Still taking and tolerating atorvastatin 3 times a week.  Last LDL was 87 in November of last year.  Requesting refill on her muscle relaxant Flexeril and meclizine which she takes as needed for vertigo. Had a few vertigo episodes this wk  Plaquenil d/c by her rheumatologist because it was causing skin discoloration.  Placed on methotrexate 2.5 mg 4 tabs Qwk instead but pt feels it is not working.  Plan was to increase to  6 tabs but told to hold off as LFTs had increased some.  Has blood test scheduled for next wk.  Hurting from arthritis. Had to take a Tramadol today. Taking Folic acid daily  HM: Does not take any more COVID vaccines any more since 2nd shot landed her in ER with swelling on LT side of body Patient Active Problem List   Diagnosis Date Noted   Dry mouth 06/24/2022   Joint swelling 06/24/2022   Other specified abnormal immunological findings in serum 06/24/2022   Pain in limb 06/24/2022   Insulin resistance 05/19/2022   Low mean corpuscular hemoglobin concentration (MCHC) 05/19/2022   Other fatigue 05/05/2022   SOBOE (shortness of breath on exertion) 05/05/2022   Right foot pain 05/05/2022   Depression screen 05/05/2022   Arthritis 03/23/2022   Hyperlipidemia associated with type 2 diabetes mellitus (HCC) 12/24/2021   Essential hypertension 10/21/2021   Type 2 diabetes mellitus with hypoglycemia without coma, without long-term current use of insulin (HCC) 10/21/2021   Primary osteoarthritis involving multiple joints 10/21/2021   Mild intermittent asthma without complication 10/21/2021   History of herpes genitalis 10/21/2021   History of gout 10/21/2021   Gastroesophageal reflux disease without esophagitis 10/21/2021   Morbid obesity (HCC) with starting BMI 35 10/21/2021   Persistent cough 04/20/2021  Chronic vertigo 12/04/2020   COVID-19 04/16/2020   Neuropathic pain 04/12/2020   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   Gout 12/19/2019   Carotid artery stenosis 11/24/2019   Multiple joint pain 11/24/2019   Allergy to food 05/05/2018   Non-toxic nodular goiter 08/31/2017   Reflux    Asthma    Sjogren syndrome with inflammatory arthritis (HCC)      Current Outpatient Medications on File Prior to Visit  Medication Sig Dispense Refill   acetaminophen (TYLENOL) 650 MG CR tablet Take 650 mg by mouth as needed.       albuterol (VENTOLIN HFA) 108 (90 Base) MCG/ACT inhaler Inhale  2 puffs into the lungs every 6 (six) hours as needed for wheezing or shortness of breath. 8 g 5   ascorbic acid (VITAMIN C) 500 MG tablet Take 500 mg by mouth daily.     atorvastatin (LIPITOR) 10 MG tablet 1 tab PO Q Mon/Wed/Frid 36 tablet 2   b complex vitamins capsule Take 1 capsule by mouth daily. 1 chewable once daily.     benzonatate (TESSALON) 200 MG capsule Take 1 capsule (200 mg total) by mouth 3 (three) times daily as needed for cough. 30 capsule 6   BIOTIN PO Take by mouth.     Calcium 500-2.5 MG-MCG CHEW Chew 2 tablets by mouth daily.     Cetirizine HCl (ZYRTEC PO) Take 10 mg by mouth.      Cholecalciferol (VITAMIN D PO) Take by mouth. TAKES 2000      Cinnamon 500 MG TABS Take 2 tablets by mouth daily.     Continuous Blood Gluc Receiver (FREESTYLE LIBRE 2 READER) DEVI Use to check blood sugar three times daily. Change sensors once every 14 days. E11.65 1 each 0   cycloSPORINE (RESTASIS) 0.05 % ophthalmic emulsion 1 drop 2 (two) times daily.     dexlansoprazole (DEXILANT) 60 MG capsule Take 1 capsule (60 mg total) by mouth every morning. 30 capsule 5   diclofenac Sodium (VOLTAREN) 1 % GEL Apply 2 g topically daily. Once daily in the morning as needed.     EPINEPHrine 0.3 mg/0.3 mL IJ SOAJ injection Inject 0.3 mg into the muscle as needed for anaphylaxis. 1 each 1   ergocalciferol (VITAMIN D2) 1.25 MG (50000 UT) capsule Take 1 capsule (50,000 Units total) by mouth once a week. 12 capsule 1   folic acid (FOLVITE) 1 MG tablet 1 (one) time each day at the same time.     Glucose 15 g PACK Take by mouth. Chew up to 4 tablets prn.     glucose blood (FREESTYLE LITE) test strip Use as instructed 100 each 12   hydrochlorothiazide (HYDRODIURIL) 25 MG tablet Take 1 tablet (25 mg total) by mouth 2 (two) times daily. 180 tablet 2   ipratropium-albuterol (DUONEB) 0.5-2.5 (3) MG/3ML SOLN Take 3 mLs by nebulization every 6 (six) hours as needed. 100 mL 3   Lancets (FREESTYLE) lancets Use as instructed  100 each 12   meloxicam (MOBIC) 15 MG tablet Take 1 tablet (15 mg total) by mouth daily. 30 tablet 5   methotrexate (RHEUMATREX) 2.5 MG tablet      montelukast (SINGULAIR) 10 MG tablet Take 1 tablet (10 mg total) by mouth at bedtime. 30 tablet 11   Multiple Vitamins-Iron (MULTIVITAMIN/IRON PO) Take by mouth.       nebivolol (BYSTOLIC) 5 MG tablet Take 1 tablet (5 mg total) by mouth every morning. 90 tablet 3   polyethylene glycol (MIRALAX /  GLYCOLAX) 17 g packet Take 17 g by mouth daily.     triamcinolone (NASACORT) 55 MCG/ACT nasal inhaler Place 2 sprays into the nose as needed.       valACYclovir (VALTREX) 500 MG tablet Take 1 tablet (500 mg total) by mouth daily. 30 tablet 11   vitamin E 180 MG (400 UNITS) capsule Take 400 Units by mouth in the morning and at bedtime.     No current facility-administered medications on file prior to visit.    Allergies  Allergen Reactions   Codeine Swelling   Duratuss [Phenylephrine-Guaifenesin] Hives   Erythromycin Swelling and Hives    ALLERGIC TO MYCINS  ALLERGIC TO MYCINS ALLERGIC TO MYCINS    ALLERGIC TO MYCINS   Hydrocodone-Acetaminophen Swelling   Iodine Swelling   Latex Swelling   Oxycodone Swelling and Other (See Comments)   Peanuts [Nuts] Swelling   Penicillins Swelling and Other (See Comments)   Shellfish Allergy Swelling   Vicodin [Hydrocodone-Acetaminophen] Swelling   Augmentin [Amoxicillin-Pot Clavulanate]    Phentermine     Other Reaction(s): Not available    Social History   Socioeconomic History   Marital status: Single    Spouse name: Not on file   Number of children: Not on file   Years of education: Not on file   Highest education level: Bachelor's degree (e.g., BA, AB, BS)  Occupational History   Not on file  Tobacco Use   Smoking status: Former   Smokeless tobacco: Never  Vaping Use   Vaping status: Never Used  Substance and Sexual Activity   Alcohol use: Yes    Comment: Rare   Drug use: No   Sexual  activity: Not Currently    Birth control/protection: Post-menopausal  Other Topics Concern   Not on file  Social History Narrative   Not on file   Social Determinants of Health   Financial Resource Strain: Low Risk  (11/16/2022)   Overall Financial Resource Strain (CARDIA)    Difficulty of Paying Living Expenses: Not hard at all  Food Insecurity: No Food Insecurity (11/16/2022)   Hunger Vital Sign    Worried About Running Out of Food in the Last Year: Never true    Ran Out of Food in the Last Year: Never true  Transportation Needs: No Transportation Needs (11/16/2022)   PRAPARE - Administrator, Civil Service (Medical): No    Lack of Transportation (Non-Medical): No  Physical Activity: Inactive (11/16/2022)   Exercise Vital Sign    Days of Exercise per Week: 0 days    Minutes of Exercise per Session: 0 min  Stress: No Stress Concern Present (11/16/2022)   [employer] of Occupational Health - Occupational Stress Questionnaire    Feeling of Stress : Only a little  Social Connections: Moderately Integrated (11/16/2022)   Social Connection and Isolation Panel [NHANES]    Frequency of Communication with Friends and Family: More than three times a week    Frequency of Social Gatherings with Friends and Family: Three times a week    Attends Religious Services: More than 4 times per year    Active Member of Clubs or Organizations: Yes    Attends Banker Meetings: More than 4 times per year    Marital Status: Divorced  Intimate Partner Violence: Not At Risk (11/16/2022)   Humiliation, Afraid, Rape, and Kick questionnaire    Fear of Current or Ex-Partner: No    Emotionally Abused: No    Physically Abused: No  Sexually Abused: No    Family History  Problem Relation Age of Onset   Hypertension Mother    Heart disease Mother    Arthritis Mother    Hyperlipidemia Mother    Stroke Mother    Heart disease Father    Hypertension Father    Cancer Father         Prostate and lung   Alcohol abuse Father    Anxiety disorder Father    Cancer Paternal Grandmother        COLON CA   Breast cancer Maternal Aunt        LATE 50'S   Ovarian cancer Maternal Aunt     Past Surgical History:  Procedure Laterality Date   APPENDECTOMY     BACK SURGERY  03/12/2010   L5   BREAST SURGERY     BIOPSY   HERNIA REPAIR     groin bilateral   NECK SURGERY     PELVIC LAPAROSCOPY  [id number]   PILONIDAL CYST EXCISION     SALPINGOOPHORECTOMY  10/11/2002   SCOPE LSO-LEFT SEROUS CYSTADENOMA   SALPINGOOPHORECTOMY  04/12/2005   RSO/TOA   SHOULDER SURGERY Right 01/2018   TONSILLECTOMY AND ADENOIDECTOMY     TUBAL LIGATION     VAGINAL HYSTERECTOMY  01/10/2005   TVH, POSTERIOR REPAIR    ROS: Review of Systems Negative except as stated above  PHYSICAL EXAM: BP 101/68 (BP Location: Left Arm, Patient Position: Sitting, Cuff Size: Normal)   Pulse 65   Ht 5\' 6"  (1.676 m)   Wt 210 lb (95.3 kg)   SpO2 100%   BMI 33.89 kg/m   Wt Readings from Last 3 Encounters:  12/31/22 210 lb (95.3 kg)  11/16/22 205 lb (93 kg)  09/30/22 205 lb (93 kg)    Physical Exam  General appearance - alert, well appearing, older AAF and in no distress Mental status - normal mood, behavior, speech, dress, motor activity, and thought processes Chest - clear to auscultation, no wheezes, rales or rhonchi, symmetric air entry Heart - normal rate, regular rhythm, normal S1, S2, no murmurs, rubs, clicks or gallops Extremities - moderate swelling RT ankle.  Pt wearing compression socks and laced up ankle suppor      Latest Ref Rng & Units 05/05/2022   10:08 AM 02/11/2022   10:43 AM 01/11/2022   10:48 AM  CMP  Glucose 70 - 99 mg/dL 90     BUN 8 - 27 mg/dL 24     Creatinine 1.47 - 1.00 mg/dL 8.29     Sodium 562 - 130 mmol/L 142     Potassium 3.5 - 5.2 mmol/L 4.1     Chloride 96 - 106 mmol/L 101     CO2 20 - 29 mmol/L 27     Calcium 8.7 - 10.3 mg/dL 9.2     Total Protein 6.0 - 8.5 g/dL  6.8  6.9  7.0   Total Bilirubin 0.0 - 1.2 mg/dL 0.3  0.4  0.3   Alkaline Phos 44 - 121 IU/L 115  114  126   AST 0 - 40 IU/L 24  27  47   ALT 0 - 32 IU/L 29  24  56    Lipid Panel     Component Value Date/Time   CHOL 156 01/11/2022 1048   TRIG 95 01/11/2022 1048   HDL 51 01/11/2022 1048   CHOLHDL 3.1 01/11/2022 1048   LDLCALC 87 01/11/2022 1048    CBC    Component  Value Date/Time   WBC 3.6 05/05/2022 1008   WBC 6.1 03/18/2009 0555   RBC 5.12 05/05/2022 1008   RBC 4.43 03/18/2009 0555   HGB 13.0 05/05/2022 1008   HCT 41.7 05/05/2022 1008   PLT 188 05/05/2022 1008   MCV 81 05/05/2022 1008   MCH 25.4 (L) 05/05/2022 1008   MCHC 31.2 (L) 05/05/2022 1008   MCHC 33.2 03/18/2009 0555   RDW 13.2 05/05/2022 1008   LYMPHSABS 1.4 05/05/2022 1008   MONOABS 0.4 03/17/2009 1053   EOSABS 0.1 05/05/2022 1008   BASOSABS 0.0 05/05/2022 1008    ASSESSMENT AND PLAN: 1. Type 2 diabetes mellitus with obesity (HCC) At goal and diet control.  Encouraged her to continue healthy eating habits.  She plans to get back in with medical weight management once she is finished physical therapy on her right ankle. - POCT glycosylated hemoglobin (Hb A1C) - POCT glucose (manual entry) - Continuous Glucose Sensor (FREESTYLE LIBRE 2 SENSOR) MISC; Use to check blood sugar three times daily. Change sensors once every 14 days. E11.65  Dispense: 2 each; Refill: 6 - Ambulatory referral to Ophthalmology  2. Hypertension associated with diabetes (HCC) At goal.  Continue hydrochlorothiazide 25 mg twice a day and nebivolol 5 mg daily.   3. Hyperlipidemia associated with type 2 diabetes mellitus (HCC) Continue atorvastatin 10 mg daily.  4. Muscle cramp - cyclobenzaprine (FLEXERIL) 10 MG tablet; Take 1 tablet (10 mg total) by mouth 3 (three) times daily as needed.  Dispense: 30 tablet; Refill: 3  5. History of vertigo - meclizine (ANTIVERT) 25 MG tablet; Take 1 tablet (25 mg total) by mouth 2 (two) times daily  as needed for dizziness.  Dispense: 30 tablet; Refill: 1  6. Polyarthritis Followed by rheumatology.  7. Encounter for screening mammogram for malignant neoplasm of breast - MM Digital Screening; Future  8. Encounter for immunization - Flu Vaccine Trivalent High Dose (Fluad)      Patient was given the opportunity to ask questions.  Patient verbalized understanding of the plan and was able to repeat key elements of the plan.   This documentation was completed using Paediatric nurse.  Any transcriptional errors are unintentional.  Orders Placed This Encounter  Procedures   MM Digital Screening   Flu Vaccine Trivalent High Dose (Fluad)   Ambulatory referral to Ophthalmology   POCT glycosylated hemoglobin (Hb A1C)   POCT glucose (manual entry)     Requested Prescriptions   Signed Prescriptions Disp Refills   Continuous Glucose Sensor (FREESTYLE LIBRE 2 SENSOR) MISC 2 each 6    Sig: Use to check blood sugar three times daily. Change sensors once every 14 days. E11.65   cyclobenzaprine (FLEXERIL) 10 MG tablet 30 tablet 3    Sig: Take 1 tablet (10 mg total) by mouth 3 (three) times daily as needed.   meclizine (ANTIVERT) 25 MG tablet 30 tablet 1    Sig: Take 1 tablet (25 mg total) by mouth 2 (two) times daily as needed for dizziness.    Return in about 4 months (around 05/02/2023).  Jonah Blue, MD, FACP

## 2023-01-04 ENCOUNTER — Encounter: Payer: Self-pay | Admitting: Physical Therapy

## 2023-01-04 ENCOUNTER — Ambulatory Visit: Payer: Medicare Other | Admitting: Physical Therapy

## 2023-01-04 DIAGNOSIS — M25571 Pain in right ankle and joints of right foot: Secondary | ICD-10-CM | POA: Diagnosis not present

## 2023-01-04 DIAGNOSIS — R2689 Other abnormalities of gait and mobility: Secondary | ICD-10-CM | POA: Diagnosis not present

## 2023-01-04 DIAGNOSIS — M6281 Muscle weakness (generalized): Secondary | ICD-10-CM

## 2023-01-04 DIAGNOSIS — R600 Localized edema: Secondary | ICD-10-CM | POA: Diagnosis not present

## 2023-01-04 NOTE — Therapy (Signed)
OUTPATIENT PHYSICAL THERAPY TREATMENT NOTE   Patient Name: [name] MRN: [id number] DOB:05-02-1955, 67 y.o., female Today's Date: 01/04/2023   END OF SESSION:  PT End of Session - 01/04/23 1109     Visit Number 5    Number of Visits 17    Date for PT Re-Evaluation 02/03/23    Authorization Type [employer] MCR    Authorization Time Period 12/23/2022 - 02/03/2023    Authorization - Visit Number 4    Authorization - Number of Visits 12    Progress Note Due on Visit 10    PT Start Time 1105    PT Stop Time 1200    PT Time Calculation (min) 55 min                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   HTN (hypertension)    Joint pain    Lactose intolerance    Multiple food allergies    OA (osteoarthritis)    Reflux    Sjogren's syndrome (HCC)    SOB (shortness of breath)    Swallowing difficulty    Thyroid condition    Vitamin D deficiency    Past Surgical History:  Procedure Laterality Date   APPENDECTOMY     BACK SURGERY  03/12/2010   L5   BREAST SURGERY     BIOPSY   HERNIA REPAIR     groin bilateral   NECK SURGERY     PELVIC LAPAROSCOPY  3664,4034   PILONIDAL CYST EXCISION     SALPINGOOPHORECTOMY  10/11/2002   SCOPE LSO-LEFT SEROUS CYSTADENOMA   SALPINGOOPHORECTOMY  04/12/2005   RSO/TOA   SHOULDER SURGERY Right 01/2018   TONSILLECTOMY AND ADENOIDECTOMY     TUBAL LIGATION     VAGINAL HYSTERECTOMY  01/10/2005   [facility], POSTERIOR REPAIR   Patient Active Problem List   Diagnosis Date Noted   Dry mouth 06/24/2022   Joint swelling 06/24/2022   Other specified abnormal immunological findings in serum 06/24/2022   Pain in limb 06/24/2022   Insulin resistance 05/19/2022   Low mean corpuscular hemoglobin concentration (MCHC) 05/19/2022   Other fatigue 05/05/2022   SOBOE  (shortness of breath on exertion) 05/05/2022   Right foot pain 05/05/2022   Depression screen 05/05/2022   Arthritis 03/23/2022   Hyperlipidemia associated with type 2 diabetes mellitus (HCC) 12/24/2021   Essential hypertension 10/21/2021   Type 2 diabetes mellitus with hypoglycemia without coma, without long-term current use of insulin (HCC) 10/21/2021   Primary osteoarthritis involving multiple joints 10/21/2021   Mild intermittent asthma without complication 10/21/2021   History of herpes genitalis 10/21/2021   History of gout 10/21/2021   Gastroesophageal reflux disease without esophagitis 10/21/2021   Morbid obesity (HCC) with starting BMI 35 10/21/2021   Persistent cough 04/20/2021   Chronic vertigo 12/04/2020   COVID-19 04/16/2020   Neuropathic pain 04/12/2020   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   Gout 12/19/2019   Carotid artery stenosis 11/24/2019   Multiple joint pain 11/24/2019   Allergy to food 05/05/2018   Non-toxic nodular goiter 08/31/2017   Reflux    Asthma    Sjogren syndrome with inflammatory arthritis (HCC)     PCP: Marcine Matar, MD  REFERRING PROVIDER: Raquel James, PA-C   REFERRING DIAG: Pain in right ankle and joints of right foot   THERAPY DIAG:  Pain in right ankle and joints of right foot  Muscle weakness (generalized)  Rationale for Evaluation and Treatment: Rehabilitation  ONSET DATE: 10/07/2022   SUBJECTIVE:  SUBJECTIVE STATEMENT: Patient arrives with ASO brace in normal shoe. She reports increased medical knee pain.    PERTINENT HISTORY: DOS: 10/07/2022 Right triple arthrodesis (subtalar joint, the talonavicular joint, and the calcaneocuboid joint) gastrocnemius recession peroneal tendon lengthening with tibial bone marrow aspirate  PAIN:  Are you having pain?  NPRS scale: 7/10, sore Pain location: Right ankle  PRECAUTIONS: Fall  WEIGHT BEARING RESTRICTIONS: WBAT in ASO brace per surgeon 12/28/2022  PATIENT  GOALS: Get back to walking without limitation    OBJECTIVE:  PATIENT SURVEYS:  FOTO 34% functional status  EDEMA:  Figure 8: right 56 cm, left 52 cm  MUSCLE LENGTH: Calf flexibility deficit  PALPATION: Patient reports tenderness over incisions and portal sites  LOWER EXTREMITY ROM:  Active ROM Right eval Left eval Rt 12/23/22 RT 12/28/22  Hip flexion      Hip extension      Hip abduction      Hip adduction      Hip internal rotation      Hip external rotation      Knee flexion      Knee extension      Ankle dorsiflexion Lacking 5  6d 10d  Ankle plantarflexion 30   30d  Ankle inversion 0     Ankle eversion 0      (Blank rows = not tested)  LOWER EXTREMITY MMT:  MMT Right eval Left eval  Hip flexion    Hip extension    Hip abduction    Hip adduction    Hip internal rotation    Hip external rotation    Knee flexion    Knee extension    Ankle dorsiflexion 4   Ankle plantarflexion -   Ankle inversion -   Ankle eversion -    (Blank rows = not tested)  FUNCTIONAL TESTS:  Not assessed  GAIT: Distance walked: 50 ft Assistive device utilized: Walker - 2 wheeled Level of assistance: Modified independence Comments: Step-to gait pattern with PWB on right   TODAY'S TREATMENT: [facility] Adult PT Treatment:                                                DATE: 01/04/23 Therapeutic Exercise: Towel scrunches Towel slides Evinv Seated heel and toe raises Long sitting PF Green band  Long sitting DF Green band - increased pain  Seated DF red, Inv, Ev  Seated Towel stretch for gastroc and soleus  Therapeutic Activity: SPC training in correct hand with ASO donned. Pt able to significantly decrease limp with cues.  Modalities: Vasopneumatic (Game Ready): Location: Right ankle Time: 15 min Temperature: 34 deg Pressure: Medium    [facility] Adult PT Treatment:                                                DATE: 12/30/22 Therapeutic Exercise: NuStep L6 x 5 min with  UE/LE to work on ankle mobility Longsitting  calf and plantar fascia stretch with towel 3 x 30 sec Longsitting ankle PF with blue 3 x 10 Manual: Right talocrural distraction and A/P mobs to improve ankle motion STM right calf and scar tissue massage Passive right ankle PF/DF Modalities: Vasopneumatic (Game Ready): Location: Right ankle Time: 15 min Temperature: 34 deg Pressure: Medium   [facility] Adult PT Treatment:                                                DATE: 12/28/22 Therapeutic Exercise: Longsitting calf stretch 3 x 30 sec Longsitting ankle PF with green 2x10 3" Seated ankle DF with green 2x10 3" Seated figure-4 ankle PF/DF and toe PROM x 10 5" Seated heel toe raises/heel raises 2x 10 Seated towel scrunches and arch lifts 2x10 Seated ankle Inv/Ev windshield wipers 2x10  Updated HEP    [facility] Adult PT Treatment:                                                DATE: 12/23/22 Therapeutic Exercise: Longsitting calf stretch 4 x 20 sec Longsitting ankle PF with green 2x10 Seated figure-4 ankle PF/DF and toe PROM x 5 10" Seated heel toe raises/heel raises 2x 10 Seated towel scrunches 2x10 Seated arch lifts x10 Seated ankle Inv/Ev windshield wipers 2x10  Updated HEP  PATIENT EDUCATION:  Education details: HEP Person educated: Patient Education method: Programmer, multimedia, Facilities manager, Actor cues, Verbal cues Education comprehension: verbalized understanding, returned demonstration, verbal cues required, tactile cues required, and needs further education  HOME EXERCISE PROGRAM: Access Code: [email or web address]   ASSESSMENT: CLINICAL IMPRESSION: Pt reports her medial ankle pain is increased. She arrives with ASO, no AD, and antalgic gait pattern. Discussed use of SPC to assist in pain relief and normalizing gait pattern. She does have a 4 prong cane which she left in the car. Used Clinic's SPC and pt reports she has been using her cane on the wrong side. With cues, she was able to improve  gait mechanics and decrease pain. Continued with AAROM and light strengthening of ankle. She is very tender in medial ankle and has not been using ice. Reminded of the benefits of ice while elevating to reduce pain and edema in foot. Repeated vaso at end of session as pt reports it was very helpful prior.  Patient would benefit from continued skilled PT to progress her mobility and strength in order to reduce pain and maximize functional ability.    OBJECTIVE IMPAIRMENTS: Abnormal gait, decreased activity tolerance, decreased balance, difficulty walking, decreased ROM, decreased strength, increased edema, impaired flexibility, and pain.   ACTIVITY LIMITATIONS: standing, squatting, stairs, transfers, bathing, dressing, and locomotion level  PARTICIPATION LIMITATIONS: meal prep, cleaning, laundry, driving, shopping, and community activity  PERSONAL FACTORS: Fitness, Past/current experiences, and Time since onset of injury/illness/exacerbation are also affecting patient's functional outcome.    GOALS: Goals reviewed with patient? Yes  SHORT TERM GOALS: Target date: 01/06/2023  Patient will be I with initial HEP in order to progress with therapy. Baseline: HEP provided at eval Goal status: Ongoing  2.  Patient will demonstrate normalized gait mechanics with LRAD in order to improve mobility and community access Baseline: see limitations above 01/04/23: pt to acquire [facility]- currently has prong  cane which she does not use Goal status: ONGOING  3.  Patient will demonstrate >/= 5 deg right ankle DF in order to normalize gait Baseline: lacking 5 deg 12/23/22: 6d Goal status: MET  LONG TERM GOALS: Target date: 02/03/2023  Patient will be I with final HEP to maintain progress from PT. Baseline: HEP provided at eval Goal status: INITIAL  2.  Patient will report >/= 52% status on FOTO to indicate improved functional ability. Baseline: 34% functional status Goal status: INITIAL  3.  Patient  will demonstrate right ankle DF/PF strength >/= 4/5 MMT in order to improve gait and weight bearing tolerance Baseline: see limitations above Goal status: INITIAL  4.  Patient will report no limitations with community ambulation using LRAD in order to maximize her level of function Baseline: limited with household mobility Goal status: INITIAL   PLAN: PT FREQUENCY: 1-2x/week  PT DURATION: 8 weeks  PLANNED INTERVENTIONS: Therapeutic exercises, Therapeutic activity, Neuromuscular re-education, Balance training, Gait training, Patient/Family education, Self Care, Joint mobilization, Aquatic Therapy, Dry Needling, Cryotherapy, Moist heat, scar mobilization, Taping, Vasopneumatic device, Manual therapy, and Re-evaluation  PLAN FOR NEXT SESSION: Review HEP and progress PRN, right ankle stretching for DF/PF, ankle and foot intrinsic strengthening, gait training and progression to WBAT as allowed, balance and ankle stability training   Jannette Spanner, PTA 01/04/23 12:40 PM Phone: [phone] Fax: [phone]

## 2023-01-05 DIAGNOSIS — M3505 Sjogren syndrome with inflammatory arthritis: Secondary | ICD-10-CM | POA: Diagnosis not present

## 2023-01-06 ENCOUNTER — Encounter: Payer: Self-pay | Admitting: Physical Therapy

## 2023-01-06 ENCOUNTER — Ambulatory Visit: Payer: Medicare Other | Admitting: Physical Therapy

## 2023-01-06 DIAGNOSIS — M6281 Muscle weakness (generalized): Secondary | ICD-10-CM | POA: Diagnosis not present

## 2023-01-06 DIAGNOSIS — R600 Localized edema: Secondary | ICD-10-CM | POA: Diagnosis not present

## 2023-01-06 DIAGNOSIS — M25571 Pain in right ankle and joints of right foot: Secondary | ICD-10-CM

## 2023-01-06 DIAGNOSIS — R2689 Other abnormalities of gait and mobility: Secondary | ICD-10-CM

## 2023-01-06 NOTE — Therapy (Signed)
OUTPATIENT PHYSICAL THERAPY TREATMENT NOTE   Patient Name: [name] MRN: [id number] DOB:April 18, 1955, 67 y.o., female Today's Date: 01/06/2023   END OF SESSION:  PT End of Session - 01/06/23 1017     Visit Number 6    Number of Visits 17    Date for PT Re-Evaluation 02/03/23    Authorization Type [employer] MCR    Authorization Time Period 12/23/2022 - 02/03/2023    Authorization - Visit Number 5    Authorization - Number of Visits 12    Progress Note Due on Visit 10    PT Start Time 1015    PT Stop Time 1100    PT Time Calculation (min) 45 min                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   HTN (hypertension)    Joint pain    Lactose intolerance    Multiple food allergies    OA (osteoarthritis)    Reflux    Sjogren's syndrome (HCC)    SOB (shortness of breath)    Swallowing difficulty    Thyroid condition    Vitamin D deficiency    Past Surgical History:  Procedure Laterality Date   APPENDECTOMY     BACK SURGERY  03/12/2010   L5   BREAST SURGERY     BIOPSY   HERNIA REPAIR     groin bilateral   NECK SURGERY     PELVIC LAPAROSCOPY  [id number]   PILONIDAL CYST EXCISION     SALPINGOOPHORECTOMY  10/11/2002   SCOPE LSO-LEFT SEROUS CYSTADENOMA   SALPINGOOPHORECTOMY  04/12/2005   RSO/TOA   SHOULDER SURGERY Right 01/2018   TONSILLECTOMY AND ADENOIDECTOMY     TUBAL LIGATION     VAGINAL HYSTERECTOMY  01/10/2005   [facility], POSTERIOR REPAIR   Patient Active Problem List   Diagnosis Date Noted   Dry mouth 06/24/2022   Joint swelling 06/24/2022   Other specified abnormal immunological findings in serum 06/24/2022   Pain in limb 06/24/2022   Insulin resistance 05/19/2022   Low mean corpuscular hemoglobin concentration (MCHC) 05/19/2022   Other fatigue 05/05/2022   SOBOE  (shortness of breath on exertion) 05/05/2022   Right foot pain 05/05/2022   Depression screen 05/05/2022   Arthritis 03/23/2022   Hyperlipidemia associated with type 2 diabetes mellitus (HCC) 12/24/2021   Essential hypertension 10/21/2021   Type 2 diabetes mellitus with hypoglycemia without coma, without long-term current use of insulin (HCC) 10/21/2021   Primary osteoarthritis involving multiple joints 10/21/2021   Mild intermittent asthma without complication 10/21/2021   History of herpes genitalis 10/21/2021   History of gout 10/21/2021   Gastroesophageal reflux disease without esophagitis 10/21/2021   Morbid obesity (HCC) with starting BMI 35 10/21/2021   Persistent cough 04/20/2021   Chronic vertigo 12/04/2020   COVID-19 04/16/2020   Neuropathic pain 04/12/2020   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   Gout 12/19/2019   Carotid artery stenosis 11/24/2019   Multiple joint pain 11/24/2019   Allergy to food 05/05/2018   Non-toxic nodular goiter 08/31/2017   Reflux    Asthma    Sjogren syndrome with inflammatory arthritis (HCC)     PCP: Marcine Matar, MD  REFERRING PROVIDER: Raquel James, PA-C   REFERRING DIAG: Pain in right ankle and joints of right foot   THERAPY DIAG:  Pain in right ankle and joints of right foot  Muscle weakness (generalized)  Other abnormalities of gait and mobility  Rationale for Evaluation and Treatment: Rehabilitation  ONSET DATE: 10/07/2022   SUBJECTIVE:  SUBJECTIVE STATEMENT: Pt reports continued medial ankle pain and edema. She purchased a [facility] which is helpful for her gait.    PERTINENT HISTORY: DOS: 10/07/2022 Right triple arthrodesis (subtalar joint, the talonavicular joint, and the calcaneocuboid joint) gastrocnemius recession peroneal tendon lengthening with tibial bone marrow aspirate  PAIN:  Are you having pain?  NPRS scale: 7/10, sore Pain location: Right ankle  PRECAUTIONS: Fall  WEIGHT BEARING RESTRICTIONS:  WBAT in ASO brace per surgeon 12/28/2022  PATIENT GOALS: Get back to walking without limitation    OBJECTIVE:  PATIENT SURVEYS:  FOTO 34% functional status  EDEMA:  Figure 8: right 56 cm, left 52 cm  MUSCLE LENGTH: Calf flexibility deficit  PALPATION: Patient reports tenderness over incisions and portal sites  LOWER EXTREMITY ROM:  Active ROM Right eval Left eval Rt 12/23/22 RT 12/28/22  Hip flexion      Hip extension      Hip abduction      Hip adduction      Hip internal rotation      Hip external rotation      Knee flexion      Knee extension      Ankle dorsiflexion Lacking 5  6d 10d  Ankle plantarflexion 30   30d  Ankle inversion 0     Ankle eversion 0      (Blank rows = not tested)  LOWER EXTREMITY MMT:  MMT Right eval Left eval  Hip flexion    Hip extension    Hip abduction    Hip adduction    Hip internal rotation    Hip external rotation    Knee flexion    Knee extension    Ankle dorsiflexion 4   Ankle plantarflexion -   Ankle inversion -   Ankle eversion -    (Blank rows = not tested)  FUNCTIONAL TESTS:  Not assessed  GAIT: Distance walked: 50 ft Assistive device utilized: Walker - 2 wheeled Level of assistance: Modified independence Comments: Step-to gait pattern with PWB on right   TODAY'S TREATMENT: [facility] Adult PT Treatment:                                                DATE: 01/06/23 Therapeutic Exercise: Therapeutic Exercise: Towel scrunches Towel slides Evinv Seated heel and toe raises Long sitting PF Green band  Long sitting DF Green band - increased pain  Seated DF red, EV green Seated wooden rocker board  Seated Baps circles x 10 each direction.  Seated Towel stretch for gastroc and soleus  Modalities: Vasopneumatic (Game Ready): Location: Right ankle Time: 15 min Temperature: 34 deg Pressure: Medium   [facility] Adult PT Treatment:                                                DATE: 01/04/23 Therapeutic  Exercise: Towel scrunches Towel slides Evinv Seated heel and  toe raises Long sitting PF Green band  Long sitting DF Green band - increased pain  Seated DF red, Inv, Ev  Seated Towel stretch for gastroc and soleus  Therapeutic Activity: SPC training in correct hand with ASO donned. Pt able to significantly decrease limp with cues.  Modalities: Vasopneumatic (Game Ready): Location: Right ankle Time: 15 min Temperature: 34 deg Pressure: Medium    [facility] Adult PT Treatment:                                                DATE: 12/30/22 Therapeutic Exercise: NuStep L6 x 5 min with UE/LE to work on ankle mobility Longsitting calf and plantar fascia stretch with towel 3 x 30 sec Longsitting ankle PF with blue 3 x 10 Manual: Right talocrural distraction and A/P mobs to improve ankle motion STM right calf and scar tissue massage Passive right ankle PF/DF Modalities: Vasopneumatic (Game Ready): Location: Right ankle Time: 15 min Temperature: 34 deg Pressure: Medium   [facility] Adult PT Treatment:                                                DATE: 12/28/22 Therapeutic Exercise: Longsitting calf stretch 3 x 30 sec Longsitting ankle PF with green 2x10 3" Seated ankle DF with green 2x10 3" Seated figure-4 ankle PF/DF and toe PROM x 10 5" Seated heel toe raises/heel raises 2x 10 Seated towel scrunches and arch lifts 2x10 Seated ankle Inv/Ev windshield wipers 2x10  Updated HEP    [facility] Adult PT Treatment:                                                DATE: 12/23/22 Therapeutic Exercise: Longsitting calf stretch 4 x 20 sec Longsitting ankle PF with green 2x10 Seated figure-4 ankle PF/DF and toe PROM x 5 10" Seated heel toe raises/heel raises 2x 10 Seated towel scrunches 2x10 Seated arch lifts x10 Seated ankle Inv/Ev windshield wipers 2x10  Updated HEP  PATIENT EDUCATION:  Education details: HEP Person educated: Patient Education method: Programmer, multimedia, Facilities manager, Actor cues,  Verbal cues Education comprehension: verbalized understanding, returned demonstration, verbal cues required, tactile cues required, and needs further education  HOME EXERCISE PROGRAM: Access Code: [email or web address]   ASSESSMENT: CLINICAL IMPRESSION: Pt reports her medial ankle pain is increased. She did purchase a SPC which has helped some. Continues with 7/10 pain at medial ankle and notes increased medial edema. Continued with AAROM and light strengthening of ankle. Due to level of pain unable to progress to closed chain activity. Pt may contact her surgeon to ensure that nothing is wrong with her ankle/foot due to the abrupt increase in pain and edema.  Patient would benefit from continued skilled PT to progress her mobility and strength in order to reduce pain and maximize functional ability.   EVAL: Patient is a 67 y.o. female who was seen today for physical therapy evaluation and treatment for right ankle pain and stiffness following surgery on 10/07/2022. She does exhibit limitations in her right ankle motion and strength, as well as increased ankle edema and is limited with  walking due to weight bearing restrictions and use of CAM boot She had fusion of subtalar joint so expect significant limitations with inversion/eversion while she still has use of talocrural joint so will focus on improving ankle dorsiflexon and plantarflexion mobility and strengthening until weight bearing restrictions removed.    OBJECTIVE IMPAIRMENTS: Abnormal gait, decreased activity tolerance, decreased balance, difficulty walking, decreased ROM, decreased strength, increased edema, impaired flexibility, and pain.   ACTIVITY LIMITATIONS: standing, squatting, stairs, transfers, bathing, dressing, and locomotion level  PARTICIPATION LIMITATIONS: meal prep, cleaning, laundry, driving, shopping, and community activity  PERSONAL FACTORS: Fitness, Past/current experiences, and Time since onset of injury/illness/exacerbation are  also affecting patient's functional outcome.    GOALS: Goals reviewed with patient? Yes  SHORT TERM GOALS: Target date: 01/06/2023  Patient will be I with initial HEP in order to progress with therapy. Baseline: HEP provided at eval Goal status: Ongoing  2.  Patient will demonstrate normalized gait mechanics with LRAD in order to improve mobility and community access Baseline: see limitations above 01/04/23: pt to acquire [employer]- currently has prong cane which she does not use Goal status: ONGOING  3.  Patient will demonstrate >/= 5 deg right ankle DF in order to normalize gait Baseline: lacking 5 deg 12/23/22: 6d Goal status: MET  LONG TERM GOALS: Target date: 02/03/2023  Patient will be I with final HEP to maintain progress from PT. Baseline: HEP provided at eval Goal status: INITIAL  2.  Patient will report >/= 52% status on FOTO to indicate improved functional ability. Baseline: 34% functional status Goal status: INITIAL  3.  Patient will demonstrate right ankle DF/PF strength >/= 4/5 MMT in order to improve gait and weight bearing tolerance Baseline: see limitations above Goal status: INITIAL  4.  Patient will report no limitations with community ambulation using LRAD in order to maximize her level of function Baseline: limited with household mobility Goal status: INITIAL   PLAN: PT FREQUENCY: 1-2x/week  PT DURATION: 8 weeks  PLANNED INTERVENTIONS: Therapeutic exercises, Therapeutic activity, Neuromuscular re-education, Balance training, Gait training, Patient/Family education, Self Care, Joint mobilization, Aquatic Therapy, Dry Needling, Cryotherapy, Moist heat, scar mobilization, Taping, Vasopneumatic device, Manual therapy, and Re-evaluation  PLAN FOR NEXT SESSION: Review HEP and progress PRN, right ankle stretching for DF/PF, ankle and foot intrinsic strengthening, gait training and progression to WBAT as allowed, balance and ankle stability training   Jannette Spanner, PTA 01/06/23 12:51 PM Phone: [phone] Fax: [phone]

## 2023-01-07 ENCOUNTER — Ambulatory Visit: Payer: Self-pay

## 2023-01-07 ENCOUNTER — Telehealth: Payer: Medicare Other | Admitting: Family Medicine

## 2023-01-07 DIAGNOSIS — B3731 Acute candidiasis of vulva and vagina: Secondary | ICD-10-CM | POA: Diagnosis not present

## 2023-01-07 MED ORDER — TERCONAZOLE 0.8 % VA CREA: 1.0000 | TOPICAL_CREAM | Freq: Every day | VAGINAL | 0 refills | Status: AC

## 2023-01-07 NOTE — Patient Instructions (Signed)

## 2023-01-07 NOTE — Telephone Encounter (Signed)
Chief Complaint: Vaginal symptoms Symptoms: itching, burning, lower back pain, vaginal discharge and vaginal odor Frequency: Constant onset Yesterday Pertinent Negatives: Patient denies fever, nausea, vomiting Disposition: [] ED /[] Urgent Care (no appt availability in office) / [] Appointment(In office/virtual)/ [x]  [facility]/ [] Home Care/ [] Refused Recommended Disposition /[] [place] [facility]/ []  Follow-up with PCP Additional Notes: Patient states she started to have vaginal itching, burning and lower back pain yesterday after taking a bath in cloudy water. Patient states she just changed her hot water tank and did not run her water long. Patient reports she had these symptoms before and terconazole vaginal cream was extremely effective for her  with a 7 day course. Patient states over the counter treatment is not helpful. Care advice was given, no appointments are available in office. Patient has been scheduled for a virtual urgent care appointment today at 1430.  Summary: poss yeast infection   Pt is having burning, itching and lower back pain.  Pt would like the vaginal cream terconazole vaginal cream, and she needs at least a 7 day dose.  [facility] - [place], [place] - [address]     Reason for Disposition  MODERATE-SEVERE itching (i.e., interferes with school, work, or sleep)  Answer Assessment - Initial Assessment Questions 1. SYMPTOM: "What's the main symptom you're concerned about?" (e.g., pain, itching, dryness)     itching 2. LOCATION: "Where is the  itching located?" (e.g., inside/outside, left/right)     Inside and outside  3. ONSET: "When did the  itching  start?"     Yesterday 4. PAIN: "Is there any pain?" If Yes, ask: "How bad is it?" (Scale: 1-10; mild, moderate, severe)   -  MILD (1-3): Doesn't interfere with normal activities.    -  MODERATE (4-7): Interferes with normal activities (e.g., work or school) or awakens from sleep.      -  SEVERE (8-10): Excruciating pain, unable to do any normal activities.     9/10 5. ITCHING: "Is there any itching?" If Yes, ask: "How bad is it?" (Scale: 1-10; mild, moderate, severe)     Mild  6. CAUSE: "What do you think is causing the discharge?" "Have you had the same problem before? What happened then?"     I think it is a yeast infection  7. OTHER SYMPTOMS: "Do you have any other symptoms?" (e.g., fever, itching, vaginal bleeding, pain with urination, injury to genital area, vaginal foreign body)     Burning, itching, odor, white discharge, lower back back pain  Protocols used: Vaginal Symptoms-A-AH

## 2023-01-07 NOTE — Progress Notes (Signed)
Virtual Visit Consent   [name], you are scheduled for a virtual visit with a [facility] provider today. Just as with appointments in the office, your consent must be obtained to participate. Your consent will be active for this visit and any virtual visit you may have with one of our providers in the next 365 days. If you have a MyChart account, a copy of this consent can be sent to you electronically.  As this is a virtual visit, video technology does not allow for your provider to perform a traditional examination. This may limit your provider's ability to fully assess your condition. If your provider identifies any concerns that need to be evaluated in person or the need to arrange testing (such as labs, EKG, etc.), we will make arrangements to do so. Although advances in technology are sophisticated, we cannot ensure that it will always work on either your end or our end. If the connection with a video visit is poor, the visit may have to be switched to a telephone visit. With either a video or telephone visit, we are not always able to ensure that we have a secure connection.  By engaging in this virtual visit, you consent to the provision of healthcare and authorize for your insurance to be billed (if applicable) for the services provided during this visit. Depending on your insurance coverage, you may receive a charge related to this service.  I need to obtain your verbal consent now. Are you willing to proceed with your visit today? [name] has provided verbal consent on 01/07/2023 for a virtual visit (video or telephone). Georgana Curio, FNP  Date: 01/07/2023 2:37 PM  Virtual Visit via Video Note   I, Georgana Curio, connected with  [name]  ([id number], 67) on 01/07/23 at  2:30 PM EDT by a video-enabled telemedicine application and verified that I am speaking with the correct person using two identifiers.  Location: Patient: Virtual Visit Location Patient:  Home Provider: Virtual Visit Location Provider: Home Office   I discussed the limitations of evaluation and management by telemedicine and the availability of in person appointments. The patient expressed understanding and agreed to proceed.    History of Present Illness: [name] is a 67 y.o. who identifies as a female who was assigned female at birth, and is being seen today for vaginal itching with thick white discharge for several days peristent. She says only terconazole for 6 days helps her. Marland Kitchen  HPI: HPI  Problems:  Patient Active Problem List   Diagnosis Date Noted   Dry mouth 06/24/2022   Joint swelling 06/24/2022   Other specified abnormal immunological findings in serum 06/24/2022   Pain in limb 06/24/2022   Insulin resistance 05/19/2022   Low mean corpuscular hemoglobin concentration (MCHC) 05/19/2022   Other fatigue 05/05/2022   SOBOE (shortness of breath on exertion) 05/05/2022   Right foot pain 05/05/2022   Depression screen 05/05/2022   Arthritis 03/23/2022   Hyperlipidemia associated with type 2 diabetes mellitus (HCC) 12/24/2021   Essential hypertension 10/21/2021   Type 2 diabetes mellitus with hypoglycemia without coma, without long-term current use of insulin (HCC) 10/21/2021   Primary osteoarthritis involving multiple joints 10/21/2021   Mild intermittent asthma without complication 10/21/2021   History of herpes genitalis 10/21/2021   History of gout 10/21/2021   Gastroesophageal reflux disease without esophagitis 10/21/2021   Morbid obesity (HCC) with starting BMI 35 10/21/2021   Persistent cough 04/20/2021   Chronic vertigo  12/04/2020   COVID-19 04/16/2020   Neuropathic pain 04/12/2020   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   Gout 12/19/2019   Carotid artery stenosis 11/24/2019   Multiple joint pain 11/24/2019   Allergy to food 05/05/2018   Non-toxic nodular goiter 08/31/2017   Reflux    Asthma    Sjogren syndrome with inflammatory  arthritis (HCC)     Allergies:  Allergies  Allergen Reactions   Codeine Swelling   Duratuss [Phenylephrine-Guaifenesin] Hives   Erythromycin Swelling and Hives    ALLERGIC TO MYCINS  ALLERGIC TO MYCINS ALLERGIC TO MYCINS    ALLERGIC TO MYCINS   Hydrocodone-Acetaminophen Swelling   Iodine Swelling   Latex Swelling   Oxycodone Swelling and Other (See Comments)   Peanuts [Nuts] Swelling   Penicillins Swelling and Other (See Comments)   Shellfish Allergy Swelling   Vicodin [Hydrocodone-Acetaminophen] Swelling   Augmentin [Amoxicillin-Pot Clavulanate]    Phentermine     Other Reaction(s): Not available   Medications:  Current Outpatient Medications:    acetaminophen (TYLENOL) 650 MG CR tablet, Take 650 mg by mouth as needed.  , Disp: , Rfl:    albuterol (VENTOLIN HFA) 108 (90 Base) MCG/ACT inhaler, Inhale 2 puffs into the lungs every 6 (six) hours as needed for wheezing or shortness of breath., Disp: 8 g, Rfl: 5   ascorbic acid (VITAMIN C) 500 MG tablet, Take 500 mg by mouth daily., Disp: , Rfl:    atorvastatin (LIPITOR) 10 MG tablet, 1 tab PO Q Mon/Wed/Frid, Disp: 36 tablet, Rfl: 2   b complex vitamins capsule, Take 1 capsule by mouth daily. 1 chewable once daily., Disp: , Rfl:    benzonatate (TESSALON) 200 MG capsule, Take 1 capsule (200 mg total) by mouth 3 (three) times daily as needed for cough., Disp: 30 capsule, Rfl: 6   BIOTIN PO, Take by mouth., Disp: , Rfl:    Calcium 500-2.5 MG-MCG CHEW, Chew 2 tablets by mouth daily., Disp: , Rfl:    Cetirizine HCl (ZYRTEC PO), Take 10 mg by mouth. , Disp: , Rfl:    Cholecalciferol (VITAMIN D PO), Take by mouth. TAKES 2000 , Disp: , Rfl:    Cinnamon 500 MG TABS, Take 2 tablets by mouth daily., Disp: , Rfl:    Continuous Blood Gluc Receiver (FREESTYLE LIBRE 2 READER) DEVI, Use to check blood sugar three times daily. Change sensors once every 14 days. E11.65, Disp: 1 each, Rfl: 0   Continuous Glucose Sensor (FREESTYLE LIBRE 2 SENSOR) MISC,  Use to check blood sugar three times daily. Change sensors once every 14 days. E11.65, Disp: 2 each, Rfl: 6   cyclobenzaprine (FLEXERIL) 10 MG tablet, Take 1 tablet (10 mg total) by mouth 3 (three) times daily as needed., Disp: 30 tablet, Rfl: 3   cycloSPORINE (RESTASIS) 0.05 % ophthalmic emulsion, 1 drop 2 (two) times daily., Disp: , Rfl:    dexlansoprazole (DEXILANT) 60 MG capsule, Take 1 capsule (60 mg total) by mouth every morning., Disp: 30 capsule, Rfl: 5   diclofenac Sodium (VOLTAREN) 1 % GEL, Apply 2 g topically daily. Once daily in the morning as needed., Disp: , Rfl:    EPINEPHrine 0.3 mg/0.3 mL IJ SOAJ injection, Inject 0.3 mg into the muscle as needed for anaphylaxis., Disp: 1 each, Rfl: 1   ergocalciferol (VITAMIN D2) 1.25 MG (50000 UT) capsule, Take 1 capsule (50,000 Units total) by mouth once a week., Disp: 12 capsule, Rfl: 1   folic acid (FOLVITE) 1 MG tablet, 1 (one)  time each day at the same time., Disp: , Rfl:    Glucose 15 g PACK, Take by mouth. Chew up to 4 tablets prn., Disp: , Rfl:    glucose blood (FREESTYLE LITE) test strip, Use as instructed, Disp: 100 each, Rfl: 12   hydrochlorothiazide (HYDRODIURIL) 25 MG tablet, Take 1 tablet (25 mg total) by mouth 2 (two) times daily., Disp: 180 tablet, Rfl: 2   ipratropium-albuterol (DUONEB) 0.5-2.5 (3) MG/3ML SOLN, Take 3 mLs by nebulization every 6 (six) hours as needed., Disp: 100 mL, Rfl: 3   Lancets (FREESTYLE) lancets, Use as instructed, Disp: 100 each, Rfl: 12   meclizine (ANTIVERT) 25 MG tablet, Take 1 tablet (25 mg total) by mouth 2 (two) times daily as needed for dizziness., Disp: 30 tablet, Rfl: 1   meloxicam (MOBIC) 15 MG tablet, Take 1 tablet (15 mg total) by mouth daily., Disp: 30 tablet, Rfl: 5   methotrexate (RHEUMATREX) 2.5 MG tablet, , Disp: , Rfl:    montelukast (SINGULAIR) 10 MG tablet, Take 1 tablet (10 mg total) by mouth at bedtime., Disp: 30 tablet, Rfl: 11   Multiple Vitamins-Iron (MULTIVITAMIN/IRON PO), Take by  mouth.  , Disp: , Rfl:    nebivolol (BYSTOLIC) 5 MG tablet, Take 1 tablet (5 mg total) by mouth every morning., Disp: 90 tablet, Rfl: 3   polyethylene glycol (MIRALAX / GLYCOLAX) 17 g packet, Take 17 g by mouth daily., Disp: , Rfl:    triamcinolone (NASACORT) 55 MCG/ACT nasal inhaler, Place 2 sprays into the nose as needed.  , Disp: , Rfl:    valACYclovir (VALTREX) 500 MG tablet, Take 1 tablet (500 mg total) by mouth daily., Disp: 30 tablet, Rfl: 11   vitamin E 180 MG (400 UNITS) capsule, Take 400 Units by mouth in the morning and at bedtime., Disp: , Rfl:   Observations/Objective: Patient is well-developed, well-nourished in no acute distress.  Resting comfortably  at home.  Head is normocephalic, atraumatic.  No labored breathing.  Speech is clear and coherent with logical content.  Patient is alert and oriented at baseline.    Assessment and Plan: 1. Candidiasis of vagina  Follow up with pcp if ssx persist or worsen.   Follow Up Instructions: I discussed the assessment and treatment plan with the patient. The patient was provided an opportunity to ask questions and all were answered. The patient agreed with the plan and demonstrated an understanding of the instructions.  A copy of instructions were sent to the patient via MyChart unless otherwise noted below.     The patient was advised to call back or seek an in-person evaluation if the symptoms worsen or if the condition fails to improve as anticipated.  Time:  I spent 10 minutes with the patient via telehealth technology discussing the above problems/concerns.    Georgana Curio, FNP

## 2023-01-10 DIAGNOSIS — M79671 Pain in right foot: Secondary | ICD-10-CM | POA: Diagnosis not present

## 2023-01-10 DIAGNOSIS — M19071 Primary osteoarthritis, right ankle and foot: Secondary | ICD-10-CM | POA: Diagnosis not present

## 2023-01-10 DIAGNOSIS — M67873 Other specified disorders of tendon, right ankle and foot: Secondary | ICD-10-CM | POA: Diagnosis not present

## 2023-01-11 ENCOUNTER — Encounter: Payer: Self-pay | Admitting: Physical Therapy

## 2023-01-11 ENCOUNTER — Ambulatory Visit: Payer: Medicare Other | Attending: Physician Assistant | Admitting: Physical Therapy

## 2023-01-11 DIAGNOSIS — R2689 Other abnormalities of gait and mobility: Secondary | ICD-10-CM | POA: Diagnosis not present

## 2023-01-11 DIAGNOSIS — M6281 Muscle weakness (generalized): Secondary | ICD-10-CM | POA: Insufficient documentation

## 2023-01-11 DIAGNOSIS — M25571 Pain in right ankle and joints of right foot: Secondary | ICD-10-CM | POA: Diagnosis not present

## 2023-01-11 NOTE — Therapy (Signed)
OUTPATIENT PHYSICAL THERAPY TREATMENT NOTE   Patient Name: [name] MRN: [id number] DOB:10-07-1955, 67 y.o., female Today's Date: 01/11/2023   END OF SESSION:  PT End of Session - 01/11/23 1111     Visit Number 7    Number of Visits 17    Date for PT Re-Evaluation 02/03/23    Authorization Type [employer] MCR    Authorization Time Period 12/23/2022 - 02/03/2023    Authorization - Visit Number 6    Authorization - Number of Visits 12    Progress Note Due on Visit 10    PT Start Time 1107    PT Stop Time 1145    PT Time Calculation (min) 38 min                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   HTN (hypertension)    Joint pain    Lactose intolerance    Multiple food allergies    OA (osteoarthritis)    Reflux    Sjogren's syndrome (HCC)    SOB (shortness of breath)    Swallowing difficulty    Thyroid condition    Vitamin D deficiency    Past Surgical History:  Procedure Laterality Date   APPENDECTOMY     BACK SURGERY  03/12/2010   L5   BREAST SURGERY     BIOPSY   HERNIA REPAIR     groin bilateral   NECK SURGERY     PELVIC LAPAROSCOPY  4098,[id number]   PILONIDAL CYST EXCISION     SALPINGOOPHORECTOMY  10/11/2002   SCOPE LSO-LEFT SEROUS CYSTADENOMA   SALPINGOOPHORECTOMY  04/12/2005   RSO/TOA   SHOULDER SURGERY Right 01/2018   TONSILLECTOMY AND ADENOIDECTOMY     TUBAL LIGATION     VAGINAL HYSTERECTOMY  01/10/2005   TVH, POSTERIOR REPAIR   Patient Active Problem List   Diagnosis Date Noted   Dry mouth 06/24/2022   Joint swelling 06/24/2022   Other specified abnormal immunological findings in serum 06/24/2022   Pain in limb 06/24/2022   Insulin resistance 05/19/2022   Low mean corpuscular hemoglobin concentration (MCHC) 05/19/2022   Other fatigue 05/05/2022   SOBOE  (shortness of breath on exertion) 05/05/2022   Right foot pain 05/05/2022   Depression screen 05/05/2022   Arthritis 03/23/2022   Hyperlipidemia associated with type 2 diabetes mellitus (HCC) 12/24/2021   Essential hypertension 10/21/2021   Type 2 diabetes mellitus with hypoglycemia without coma, without long-term current use of insulin (HCC) 10/21/2021   Primary osteoarthritis involving multiple joints 10/21/2021   Mild intermittent asthma without complication 10/21/2021   History of herpes genitalis 10/21/2021   History of gout 10/21/2021   Gastroesophageal reflux disease without esophagitis 10/21/2021   Morbid obesity (HCC) with starting BMI 35 10/21/2021   Persistent cough 04/20/2021   Chronic vertigo 12/04/2020   COVID-19 04/16/2020   Neuropathic pain 04/12/2020   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   Gout 12/19/2019   Carotid artery stenosis 11/24/2019   Multiple joint pain 11/24/2019   Allergy to food 05/05/2018   Non-toxic nodular goiter 08/31/2017   Reflux    Asthma    Sjogren syndrome with inflammatory arthritis (HCC)     PCP: Marcine Matar, MD  REFERRING PROVIDER: Raquel James, PA-C   REFERRING DIAG: Pain in right ankle and joints of right foot   THERAPY DIAG:  Pain in right ankle and joints of right foot  Muscle weakness (generalized)  Rationale for Evaluation and Treatment: Rehabilitation  ONSET DATE: 10/07/2022   SUBJECTIVE:  SUBJECTIVE STATEMENT:  Can reach 10/10 with prolonged standing. Saw PA-C at surgeon's office who did an xray and a CT scan. Good alignment see on xray, PA- C suspicious for infection, waiting on CT results.   PERTINENT HISTORY: DOS: 10/07/2022 Right triple arthrodesis (subtalar joint, the talonavicular joint, and the calcaneocuboid joint) gastrocnemius recession peroneal tendon lengthening with tibial bone marrow aspirate  PAIN:  Are you having pain?  NPRS scale: 6+/10, sore Pain location: Right  ankle  PRECAUTIONS: Fall  WEIGHT BEARING RESTRICTIONS: WBAT in ASO brace per surgeon 12/28/2022  PATIENT GOALS: Get back to walking without limitation    OBJECTIVE:  PATIENT SURVEYS:  FOTO 34% functional status  EDEMA:  Figure 8: right 56 cm, left 52 cm 01/11/23: Figure 52 cm Right   MUSCLE LENGTH: Calf flexibility deficit  PALPATION: Patient reports tenderness over incisions and portal sites  LOWER EXTREMITY ROM:  Active ROM Right eval Left eval Rt 12/23/22 RT 12/28/22  Hip flexion      Hip extension      Hip abduction      Hip adduction      Hip internal rotation      Hip external rotation      Knee flexion      Knee extension      Ankle dorsiflexion Lacking 5  6d 10d  Ankle plantarflexion 30   30d  Ankle inversion 0     Ankle eversion 0      (Blank rows = not tested)  LOWER EXTREMITY MMT:  MMT Right eval Left eval  Hip flexion    Hip extension    Hip abduction    Hip adduction    Hip internal rotation    Hip external rotation    Knee flexion    Knee extension    Ankle dorsiflexion 4   Ankle plantarflexion -   Ankle inversion -   Ankle eversion -    (Blank rows = not tested)  FUNCTIONAL TESTS:  Not assessed  GAIT: Distance walked: 50 ft Assistive device utilized: Walker - 2 wheeled Level of assistance: Modified independence Comments: Step-to gait pattern with PWB on right   TODAY'S TREATMENT: [facility] Adult PT Treatment:                                                DATE: 01/11/23  Therapeutic Exercise: Therapeutic Exercise: Towel scrunches Towel slides Evinv Seated heel and toe raises Long sitting PF Green band  Seated DF GTB Seated wooden rocker board  Seated Baps L3 circles x 10 each direction.  Seated Towel stretch for gastroc and soleus Self toe stretches, self DF and PF stretches in Figure 4 position  Modalities: Vasopneumatic (Game Ready): Location: Right ankle Time: 15 min Temperature: 34 deg Pressure: Medium   [facility]  Adult PT Treatment:  DATE: 01/06/23 Therapeutic Exercise: Therapeutic Exercise: Towel scrunches Towel slides Evinv Seated heel and toe raises Long sitting PF Green band  Long sitting DF Green band - increased pain  Seated DF red, EV green Seated wooden rocker board  Seated Baps circles x 10 each direction.  Seated Towel stretch for gastroc and soleus  Modalities: Vasopneumatic (Game Ready): Location: Right ankle Time: 15 min Temperature: 34 deg Pressure: Medium   [facility] Adult PT Treatment:                                                DATE: 01/04/23 Therapeutic Exercise: Towel scrunches Towel slides Evinv Seated heel and toe raises Long sitting PF Green band  Long sitting DF Green band - increased pain  Seated DF red, Inv, Ev  Seated Towel stretch for gastroc and soleus  Therapeutic Activity: SPC training in correct hand with ASO donned. Pt able to significantly decrease limp with cues.  Modalities: Vasopneumatic (Game Ready): Location: Right ankle Time: 15 min Temperature: 34 deg Pressure: Medium    [facility] Adult PT Treatment:                                                DATE: 12/30/22 Therapeutic Exercise: NuStep L6 x 5 min with UE/LE to work on ankle mobility Longsitting calf and plantar fascia stretch with towel 3 x 30 sec Longsitting ankle PF with blue 3 x 10 Manual: Right talocrural distraction and A/P mobs to improve ankle motion STM right calf and scar tissue massage Passive right ankle PF/DF Modalities: Vasopneumatic (Game Ready): Location: Right ankle Time: 15 min Temperature: 34 deg Pressure: Medium   [facility] Adult PT Treatment:                                                DATE: 12/28/22 Therapeutic Exercise: Longsitting calf stretch 3 x 30 sec Longsitting ankle PF with green 2x10 3" Seated ankle DF with green 2x10 3" Seated figure-4 ankle PF/DF and toe PROM x 10 5" Seated heel toe raises/heel raises 2x  10 Seated towel scrunches and arch lifts 2x10 Seated ankle Inv/Ev windshield wipers 2x10  Updated HEP      PATIENT EDUCATION:  Education details: HEP Person educated: Patient Education method: Programmer, multimedia, Facilities manager, Actor cues, Verbal cues Education comprehension: verbalized understanding, returned demonstration, verbal cues required, tactile cues required, and needs further education  HOME EXERCISE PROGRAM: Access Code: [email or web address]   ASSESSMENT: CLINICAL IMPRESSION: Pt saw PA-C who completed Xray and CT scan of post op ankle.  Xray showed good post-op alignment. Waiting on CT results. PA-C  also agreed for patient to return to cam boot at this time. Pt did arrive in ASO due to being unable to drive wearing cam boot. She is wearing the boot otherwise. She did go shopping in her ASO, complains of CAM boot bothering her knee. Continued with general open chain strength and mobility as tolerated. Vaso at end of session to reduce edema.  Patient would benefit from continued skilled PT to progress her mobility and strength in order to reduce pain and  maximize functional ability.   EVAL: Patient is a 67 y.o. female who was seen today for physical therapy evaluation and treatment for right ankle pain and stiffness following surgery on 10/07/2022. She does exhibit limitations in her right ankle motion and strength, as well as increased ankle edema and is limited with walking due to weight bearing restrictions and use of CAM boot She had fusion of subtalar joint so expect significant limitations with inversion/eversion while she still has use of talocrural joint so will focus on improving ankle dorsiflexon and plantarflexion mobility and strengthening until weight bearing restrictions removed.    OBJECTIVE IMPAIRMENTS: Abnormal gait, decreased activity tolerance, decreased balance, difficulty walking, decreased ROM, decreased strength, increased edema, impaired flexibility, and pain.   ACTIVITY  LIMITATIONS: standing, squatting, stairs, transfers, bathing, dressing, and locomotion level  PARTICIPATION LIMITATIONS: meal prep, cleaning, laundry, driving, shopping, and community activity  PERSONAL FACTORS: Fitness, Past/current experiences, and Time since onset of injury/illness/exacerbation are also affecting patient's functional outcome.    GOALS: Goals reviewed with patient? Yes  SHORT TERM GOALS: Target date: 01/06/2023  Patient will be I with initial HEP in order to progress with therapy. Baseline: HEP provided at eval Goal status: MET  2.  Patient will demonstrate normalized gait mechanics with LRAD in order to improve mobility and community access Baseline: see limitations above 01/04/23: pt to acquire [facility]- currently has prong cane which she does not use Goal status: ONGOING  3.  Patient will demonstrate >/= 5 deg right ankle DF in order to normalize gait Baseline: lacking 5 deg 12/23/22: 6d Goal status: MET  LONG TERM GOALS: Target date: 02/03/2023  Patient will be I with final HEP to maintain progress from PT. Baseline: HEP provided at eval Goal status: INITIAL  2.  Patient will report >/= 52% status on FOTO to indicate improved functional ability. Baseline: 34% functional status Goal status: INITIAL  3.  Patient will demonstrate right ankle DF/PF strength >/= 4/5 MMT in order to improve gait and weight bearing tolerance Baseline: see limitations above Goal status: INITIAL  4.  Patient will report no limitations with community ambulation using LRAD in order to maximize her level of function Baseline: limited with household mobility Goal status: INITIAL   PLAN: PT FREQUENCY: 1-2x/week  PT DURATION: 8 weeks  PLANNED INTERVENTIONS: Therapeutic exercises, Therapeutic activity, Neuromuscular re-education, Balance training, Gait training, Patient/Family education, Self Care, Joint mobilization, Aquatic Therapy, Dry Needling, Cryotherapy, Moist heat, scar  mobilization, Taping, Vasopneumatic device, Manual therapy, and Re-evaluation  PLAN FOR NEXT SESSION: Review HEP and progress PRN, right ankle stretching for DF/PF, ankle and foot intrinsic strengthening, gait training and progression to WBAT as allowed, balance and ankle stability training   Jannette Spanner, PTA 01/11/23 12:58 PM Phone: [phone] Fax: [phone]

## 2023-01-13 ENCOUNTER — Ambulatory Visit: Payer: Medicare Other | Admitting: Physical Therapy

## 2023-01-13 ENCOUNTER — Encounter: Payer: Self-pay | Admitting: Physical Therapy

## 2023-01-13 DIAGNOSIS — M6281 Muscle weakness (generalized): Secondary | ICD-10-CM

## 2023-01-13 DIAGNOSIS — M25571 Pain in right ankle and joints of right foot: Secondary | ICD-10-CM | POA: Diagnosis not present

## 2023-01-13 DIAGNOSIS — R2689 Other abnormalities of gait and mobility: Secondary | ICD-10-CM | POA: Diagnosis not present

## 2023-01-13 NOTE — Therapy (Signed)
OUTPATIENT PHYSICAL THERAPY TREATMENT NOTE   Patient Name: [name] MRN: [id number] DOB:11/10/1955, 67 y.o., female Today's Date: 01/13/2023   END OF SESSION:  PT End of Session - 01/13/23 1050     Visit Number 8    Number of Visits 17    Date for PT Re-Evaluation 02/03/23    Authorization Type [employer] MCR    Authorization Time Period 12/23/2022 - 02/03/2023    Authorization - Visit Number 7    Authorization - Number of Visits 12    Progress Note Due on Visit 10    PT Start Time 1018    PT Stop Time 1117    PT Time Calculation (min) 59 min                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   HTN (hypertension)    Joint pain    Lactose intolerance    Multiple food allergies    OA (osteoarthritis)    Reflux    Sjogren's syndrome (HCC)    SOB (shortness of breath)    Swallowing difficulty    Thyroid condition    Vitamin D deficiency    Past Surgical History:  Procedure Laterality Date   APPENDECTOMY     BACK SURGERY  03/12/2010   L5   BREAST SURGERY     BIOPSY   HERNIA REPAIR     groin bilateral   NECK SURGERY     PELVIC LAPAROSCOPY  9562,1308   PILONIDAL CYST EXCISION     SALPINGOOPHORECTOMY  10/11/2002   SCOPE LSO-LEFT SEROUS CYSTADENOMA   SALPINGOOPHORECTOMY  04/12/2005   RSO/TOA   SHOULDER SURGERY Right 01/2018   TONSILLECTOMY AND ADENOIDECTOMY     TUBAL LIGATION     VAGINAL HYSTERECTOMY  01/10/2005   TVH, POSTERIOR REPAIR   Patient Active Problem List   Diagnosis Date Noted   Dry mouth 06/24/2022   Joint swelling 06/24/2022   Other specified abnormal immunological findings in serum 06/24/2022   Pain in limb 06/24/2022   Insulin resistance 05/19/2022   Low mean corpuscular hemoglobin concentration (MCHC) 05/19/2022   Other fatigue 05/05/2022   SOBOE  (shortness of breath on exertion) 05/05/2022   Right foot pain 05/05/2022   Depression screen 05/05/2022   Arthritis 03/23/2022   Hyperlipidemia associated with type 2 diabetes mellitus (HCC) 12/24/2021   Essential hypertension 10/21/2021   Type 2 diabetes mellitus with hypoglycemia without coma, without long-term current use of insulin (HCC) 10/21/2021   Primary osteoarthritis involving multiple joints 10/21/2021   Mild intermittent asthma without complication 10/21/2021   History of herpes genitalis 10/21/2021   History of gout 10/21/2021   Gastroesophageal reflux disease without esophagitis 10/21/2021   Morbid obesity (HCC) with starting BMI 35 10/21/2021   Persistent cough 04/20/2021   Chronic vertigo 12/04/2020   COVID-19 04/16/2020   Neuropathic pain 04/12/2020   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  Rationale for Evaluation and Treatment: Rehabilitation  ONSET DATE: 10/07/2022   SUBJECTIVE:  SUBJECTIVE STATEMENT:  Still waiting on CT results. It has been better because I have been back in the boot.   PERTINENT HISTORY: DOS: 10/07/2022 Right triple arthrodesis (subtalar joint, the talonavicular joint, and the calcaneocuboid joint) gastrocnemius recession peroneal tendon lengthening with tibial bone marrow aspirate  PAIN:  Are you having pain?  NPRS scale: 3/10, sore Pain location: Right ankle  PRECAUTIONS: Fall  WEIGHT BEARING RESTRICTIONS: WBAT in ASO brace per surgeon 12/28/2022  PATIENT GOALS: Get back to walking without limitation    OBJECTIVE:   PATIENT SURVEYS:  FOTO 34% functional status  EDEMA:  Figure 8: right 56 cm, left 52 cm 01/11/23: Figure 52 cm Right   MUSCLE LENGTH: Calf flexibility deficit  PALPATION: Patient reports tenderness over incisions and portal sites  LOWER EXTREMITY ROM:  Active ROM Right eval Left eval Rt 12/23/22 RT 12/28/22  Hip flexion      Hip extension      Hip abduction      Hip adduction      Hip internal rotation      Hip external rotation      Knee flexion      Knee extension      Ankle dorsiflexion Lacking 5  6d 10d  Ankle plantarflexion 30   30d  Ankle inversion 0     Ankle eversion 0      (Blank rows = not tested)  LOWER EXTREMITY MMT:  MMT Right eval Left eval  Hip flexion    Hip extension    Hip abduction    Hip adduction    Hip internal rotation    Hip external rotation    Knee flexion    Knee extension    Ankle dorsiflexion 4   Ankle plantarflexion -   Ankle inversion -   Ankle eversion -    (Blank rows = not tested)  FUNCTIONAL TESTS:  Not assessed  GAIT: Distance walked: 50 ft Assistive device utilized: Walker - 2 wheeled Level of assistance: Modified independence Comments: Step-to gait pattern with PWB on right   TODAY'S TREATMENT: [facility] Adult PT Treatment:                                                DATE: 01/13/23 Therapeutic Exercise: Towel scrunches Towel slides Evinv Seated heel and toe raises Manual Therapy: STW to gastroc  Passive stretch DF Contract relax DF stretch  Therapeutic Activity: Performed in ASO brace Narrow to staggered stance on AIREX with head turns/ trunk rotations.  Tandem stance level surface 30 +  Modalities: Vasopneumatic (Game Ready): Location: Right ankle Time: 15 min Temperature: 34 deg Pressure: Medium    [facility] Adult PT Treatment:                                                DATE: 01/11/23  Therapeutic Exercise: Therapeutic Exercise: Towel scrunches Towel slides Evinv Seated heel and toe  raises Long sitting PF Green band  Seated DF GTB Seated  wooden rocker board  Seated Baps L3 circles x 10 each direction.  Seated Towel stretch for gastroc and soleus Self toe stretches, self DF and PF stretches in Figure 4 position  Modalities: Vasopneumatic (Game Ready): Location: Right ankle Time: 15 min Temperature: 34 deg Pressure: Medium   [facility] Adult PT Treatment:                                                DATE: 01/06/23 Therapeutic Exercise: Therapeutic Exercise: Towel scrunches Towel slides Evinv Seated heel and toe raises Long sitting PF Green band  Long sitting DF Green band - increased pain  Seated DF red, EV green Seated wooden rocker board  Seated Baps circles x 10 each direction.  Seated Towel stretch for gastroc and soleus  Modalities: Vasopneumatic (Game Ready): Location: Right ankle Time: 15 min Temperature: 34 deg Pressure: Medium   [facility] Adult PT Treatment:                                                DATE: 01/04/23 Therapeutic Exercise: Towel scrunches Towel slides Evinv Seated heel and toe raises Long sitting PF Green band  Long sitting DF Green band - increased pain  Seated DF red, Inv, Ev  Seated Towel stretch for gastroc and soleus  Therapeutic Activity: SPC training in correct hand with ASO donned. Pt able to significantly decrease limp with cues.  Modalities: Vasopneumatic (Game Ready): Location: Right ankle Time: 15 min Temperature: 34 deg Pressure: Medium    [facility] Adult PT Treatment:                                                DATE: 12/30/22 Therapeutic Exercise: NuStep L6 x 5 min with UE/LE to work on ankle mobility Longsitting calf and plantar fascia stretch with towel 3 x 30 sec Longsitting ankle PF with blue 3 x 10 Manual: Right talocrural distraction and A/P mobs to improve ankle motion STM right calf and scar tissue massage Passive right ankle PF/DF Modalities: Vasopneumatic (Game Ready): Location: Right  ankle Time: 15 min Temperature: 34 deg Pressure: Medium   [facility] Adult PT Treatment:                                                DATE: 12/28/22 Therapeutic Exercise: Longsitting calf stretch 3 x 30 sec Longsitting ankle PF with green 2x10 3" Seated ankle DF with green 2x10 3" Seated figure-4 ankle PF/DF and toe PROM x 10 5" Seated heel toe raises/heel raises 2x 10 Seated towel scrunches and arch lifts 2x10 Seated ankle Inv/Ev windshield wipers 2x10  Updated HEP      PATIENT EDUCATION:  Education details: HEP Person educated: Patient Education method: Programmer, multimedia, Facilities manager, Actor cues, Verbal cues Education comprehension: verbalized understanding, returned demonstration, verbal cues required, tactile cues required, and needs further education  HOME EXERCISE PROGRAM: Access Code: [email or web address]   ASSESSMENT: CLINICAL IMPRESSION: Still waiting on CT results. Pain is improving  although still needs to wear boot most of the time.  Pt arrives in ASO. Manul STW performed to gastroc as well as passive DF. Able to complete Double leg balance in ASO, holding tandem stance for 30 sec +. She did report increased soreness afterward and Vaso was used at end of session to reduce edema.  Patient would benefit from continued skilled PT to progress her mobility and strength in order to reduce pain and maximize functional ability.   EVAL: Patient is a 67 y.o. female who was seen today for physical therapy evaluation and treatment for right ankle pain and stiffness following surgery on 10/07/2022. She does exhibit limitations in her right ankle motion and strength, as well as increased ankle edema and is limited with walking due to weight bearing restrictions and use of CAM boot She had fusion of subtalar joint so expect significant limitations with inversion/eversion while she still has use of talocrural joint so will focus on improving ankle dorsiflexon and plantarflexion mobility and strengthening  until weight bearing restrictions removed.    OBJECTIVE IMPAIRMENTS: Abnormal gait, decreased activity tolerance, decreased balance, difficulty walking, decreased ROM, decreased strength, increased edema, impaired flexibility, and pain.   ACTIVITY LIMITATIONS: standing, squatting, stairs, transfers, bathing, dressing, and locomotion level  PARTICIPATION LIMITATIONS: meal prep, cleaning, laundry, driving, shopping, and community activity  PERSONAL FACTORS: Fitness, Past/current experiences, and Time since onset of injury/illness/exacerbation are also affecting patient's functional outcome.    GOALS: Goals reviewed with patient? Yes  SHORT TERM GOALS: Target date: 01/06/2023  Patient will be I with initial HEP in order to progress with therapy. Baseline: HEP provided at eval Goal status: MET  2.  Patient will demonstrate normalized gait mechanics with LRAD in order to improve mobility and community access Baseline: see limitations above 01/04/23: pt to acquire [facility]- currently has prong cane which she does not use Goal status: ONGOING  3.  Patient will demonstrate >/= 5 deg right ankle DF in order to normalize gait Baseline: lacking 5 deg 12/23/22: 6d Goal status: MET  LONG TERM GOALS: Target date: 02/03/2023  Patient will be I with final HEP to maintain progress from PT. Baseline: HEP provided at eval Goal status: INITIAL  2.  Patient will report >/= 52% status on FOTO to indicate improved functional ability. Baseline: 34% functional status Goal status: INITIAL  3.  Patient will demonstrate right ankle DF/PF strength >/= 4/5 MMT in order to improve gait and weight bearing tolerance Baseline: see limitations above Goal status: INITIAL  4.  Patient will report no limitations with community ambulation using LRAD in order to maximize her level of function Baseline: limited with household mobility Goal status: INITIAL   PLAN: PT FREQUENCY: 1-2x/week  PT DURATION: 8  weeks  PLANNED INTERVENTIONS: Therapeutic exercises, Therapeutic activity, Neuromuscular re-education, Balance training, Gait training, Patient/Family education, Self Care, Joint mobilization, Aquatic Therapy, Dry Needling, Cryotherapy, Moist heat, scar mobilization, Taping, Vasopneumatic device, Manual therapy, and Re-evaluation  PLAN FOR NEXT SESSION: Review HEP and progress PRN, right ankle stretching for DF/PF, ankle and foot intrinsic strengthening, gait training and progression to WBAT as allowed, balance and ankle stability training   Jannette Spanner, PTA 01/13/23 11:31 AM Phone: [phone] Fax: [phone]

## 2023-01-18 ENCOUNTER — Ambulatory Visit: Payer: Medicare Other | Admitting: Physical Therapy

## 2023-01-20 ENCOUNTER — Ambulatory Visit: Payer: Medicare Other | Admitting: Physical Therapy

## 2023-01-24 DIAGNOSIS — Z4889 Encounter for other specified surgical aftercare: Secondary | ICD-10-CM | POA: Diagnosis not present

## 2023-01-24 DIAGNOSIS — M79671 Pain in right foot: Secondary | ICD-10-CM | POA: Diagnosis not present

## 2023-01-25 ENCOUNTER — Encounter: Payer: Self-pay | Admitting: Physical Therapy

## 2023-01-25 ENCOUNTER — Ambulatory Visit: Payer: Medicare Other | Admitting: Physical Therapy

## 2023-01-25 DIAGNOSIS — R2689 Other abnormalities of gait and mobility: Secondary | ICD-10-CM | POA: Diagnosis not present

## 2023-01-25 DIAGNOSIS — M25571 Pain in right ankle and joints of right foot: Secondary | ICD-10-CM

## 2023-01-25 DIAGNOSIS — M6281 Muscle weakness (generalized): Secondary | ICD-10-CM

## 2023-01-25 NOTE — Therapy (Signed)
OUTPATIENT PHYSICAL THERAPY TREATMENT NOTE   Patient Name: [name] MRN: [id number] DOB:04/18/1955, 67 y.o., female Today's Date: 01/25/2023   END OF SESSION:  PT End of Session - 01/25/23 1458     Visit Number 9    Number of Visits 17    Date for PT Re-Evaluation 02/03/23    Authorization Type [employer] MCR    Authorization Time Period 12/23/2022 - 02/03/2023    Authorization - Visit Number 8    Authorization - Number of Visits 12    Progress Note Due on Visit 10    PT Start Time 0255   10 min late               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   HTN (hypertension)    Joint pain    Lactose intolerance    Multiple food allergies    OA (osteoarthritis)    Reflux    Sjogren's syndrome (HCC)    SOB (shortness of breath)    Swallowing difficulty    Thyroid condition    Vitamin D deficiency    Past Surgical History:  Procedure Laterality Date   APPENDECTOMY     BACK SURGERY  03/12/2010   L5   BREAST SURGERY     BIOPSY   HERNIA REPAIR     groin bilateral   NECK SURGERY     PELVIC LAPAROSCOPY  7829,5621   PILONIDAL CYST EXCISION     SALPINGOOPHORECTOMY  10/11/2002   SCOPE LSO-LEFT SEROUS CYSTADENOMA   SALPINGOOPHORECTOMY  04/12/2005   RSO/TOA   SHOULDER SURGERY Right 01/2018   TONSILLECTOMY AND ADENOIDECTOMY     TUBAL LIGATION     VAGINAL HYSTERECTOMY  01/10/2005   TVH, POSTERIOR REPAIR   Patient Active Problem List   Diagnosis Date Noted   Dry mouth 06/24/2022   Joint swelling 06/24/2022   Other specified abnormal immunological findings in serum 06/24/2022   Pain in limb 06/24/2022   Insulin resistance 05/19/2022   Low mean corpuscular hemoglobin concentration (MCHC) 05/19/2022   Other fatigue 05/05/2022   SOBOE (shortness of breath on exertion) 05/05/2022    Right foot pain 05/05/2022   Depression screen 05/05/2022   Arthritis 03/23/2022   Hyperlipidemia associated with type 2 diabetes mellitus (HCC) 12/24/2021   Essential hypertension 10/21/2021   Type 2 diabetes mellitus with hypoglycemia without coma, without long-term current use of insulin (HCC) 10/21/2021   Primary osteoarthritis involving multiple joints 10/21/2021   Mild intermittent asthma without complication 10/21/2021   History of herpes genitalis 10/21/2021   History of gout 10/21/2021   Gastroesophageal reflux disease without esophagitis 10/21/2021   Morbid obesity (HCC) with starting BMI 35 10/21/2021   Persistent cough 04/20/2021   Chronic vertigo 12/04/2020   COVID-19 04/16/2020   Neuropathic pain 04/12/2020   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   Gout 12/19/2019   Carotid artery stenosis 11/24/2019   Multiple joint pain 11/24/2019   Allergy to food 05/05/2018   Non-toxic nodular goiter 08/31/2017   Reflux    Asthma    Sjogren syndrome with inflammatory arthritis (HCC)     PCP: Marcine Matar, MD  REFERRING PROVIDER: Raquel James, PA-C   REFERRING DIAG: Pain  in right ankle and joints of right foot   THERAPY DIAG:  Pain in right ankle and joints of right foot  Other abnormalities of gait and mobility  Muscle weakness (generalized)  Rationale for Evaluation and Treatment: Rehabilitation  ONSET DATE: 10/07/2022   SUBJECTIVE:  SUBJECTIVE STATEMENT:  2/10 Lateral foot. Finishing up prednisone pack today.   Still waiting on CT results. It has been better because I have been back in the boot.   PERTINENT HISTORY: DOS: 10/07/2022 Right triple arthrodesis (subtalar joint, the talonavicular joint, and the calcaneocuboid joint) gastrocnemius recession peroneal tendon lengthening with tibial bone marrow aspirate  PAIN:  Are you having pain?  NPRS scale: 3/10, sore Pain location: Right ankle  PRECAUTIONS: Fall  WEIGHT BEARING RESTRICTIONS: WBAT  in ASO brace per surgeon 12/28/2022  PATIENT GOALS: Get back to walking without limitation    OBJECTIVE:  PATIENT SURVEYS:  FOTO 34% functional status  EDEMA:  Figure 8: right 56 cm, left 52 cm 01/11/23: Figure 52 cm Right   MUSCLE LENGTH: Calf flexibility deficit  PALPATION: Patient reports tenderness over incisions and portal sites  LOWER EXTREMITY ROM:  Active ROM Right eval Left eval Rt 12/23/22 RT 12/28/22  Hip flexion      Hip extension      Hip abduction      Hip adduction      Hip internal rotation      Hip external rotation      Knee flexion      Knee extension      Ankle dorsiflexion Lacking 5  6d 10d  Ankle plantarflexion 30   30d  Ankle inversion 0     Ankle eversion 0      (Blank rows = not tested)  LOWER EXTREMITY MMT:  MMT Right eval Left eval  Hip flexion    Hip extension    Hip abduction    Hip adduction    Hip internal rotation    Hip external rotation    Knee flexion    Knee extension    Ankle dorsiflexion 4   Ankle plantarflexion -   Ankle inversion -   Ankle eversion -    (Blank rows = not tested)  FUNCTIONAL TESTS:  Not assessed  GAIT: Distance walked: 50 ft Assistive device utilized: Environmental consultant - 2 wheeled Level of assistance: Modified independence Comments: Step-to gait pattern with PWB on right   TODAY'S TREATMENT: [facility] Adult PT Treatment:                                                DATE: 01/25/23 Therapeutic Exercise: Tandem stance RLE back , LLE AIREX Stepping on and off AIREX pad Alternating AIREX taps standing without UE A/ P rocking on Blue rocker board without UE Standing gastroc stretch Standing ankle DF stretch Sated Baps L3 CW, CCW Toe scrunches  Seated heel raise 5# on knee 10 x 2  Modalities: Vasopneumatic (Game Ready): Location: Right ankle Time: 15 min Temperature: 34 deg Pressure: Medium    [facility] Adult PT Treatment:                                                DATE: 01/13/23 Therapeutic  Exercise: Towel scrunches Towel  slides Evinv Seated heel and toe raises Manual Therapy: STW to gastroc  Passive stretch DF Contract relax DF stretch  Therapeutic Activity: Performed in ASO brace Narrow to staggered stance on AIREX with head turns/ trunk rotations.  Tandem stance level surface 30 +  Modalities: Vasopneumatic (Game Ready): Location: Right ankle Time: 15 min Temperature: 34 deg Pressure: Medium    [facility] Adult PT Treatment:                                                DATE: 01/11/23  Therapeutic Exercise: Therapeutic Exercise: Towel scrunches Towel slides Evinv Seated heel and toe raises Long sitting PF Green band  Seated DF GTB Seated wooden rocker board  Seated Baps L3 circles x 10 each direction.  Seated Towel stretch for gastroc and soleus Self toe stretches, self DF and PF stretches in Figure 4 position  Modalities: Vasopneumatic (Game Ready): Location: Right ankle Time: 15 min Temperature: 34 deg Pressure: Medium   [facility] Adult PT Treatment:                                                DATE: 01/06/23 Therapeutic Exercise: Therapeutic Exercise: Towel scrunches Towel slides Evinv Seated heel and toe raises Long sitting PF Green band  Long sitting DF Green band - increased pain  Seated DF red, EV green Seated wooden rocker board  Seated Baps circles x 10 each direction.  Seated Towel stretch for gastroc and soleus  Modalities: Vasopneumatic (Game Ready): Location: Right ankle Time: 15 min Temperature: 34 deg Pressure: Medium   [facility] Adult PT Treatment:                                                DATE: 01/04/23 Therapeutic Exercise: Towel scrunches Towel slides Evinv Seated heel and toe raises Long sitting PF Green band  Long sitting DF Green band - increased pain  Seated DF red, Inv, Ev  Seated Towel stretch for gastroc and soleus  Therapeutic Activity: SPC training in correct hand with ASO donned. Pt able to significantly  decrease limp with cues.  Modalities: Vasopneumatic (Game Ready): Location: Right ankle Time: 15 min Temperature: 34 deg Pressure: Medium    [facility] Adult PT Treatment:                                                DATE: 12/30/22 Therapeutic Exercise: NuStep L6 x 5 min with UE/LE to work on ankle mobility Longsitting calf and plantar fascia stretch with towel 3 x 30 sec Longsitting ankle PF with blue 3 x 10 Manual: Right talocrural distraction and A/P mobs to improve ankle motion STM right calf and scar tissue massage Passive right ankle PF/DF Modalities: Vasopneumatic (Game Ready): Location: Right ankle Time: 15 min Temperature: 34 deg Pressure: Medium   [facility] Adult PT Treatment:  DATE: 12/28/22 Therapeutic Exercise: Longsitting calf stretch 3 x 30 sec Longsitting ankle PF with green 2x10 3" Seated ankle DF with green 2x10 3" Seated figure-4 ankle PF/DF and toe PROM x 10 5" Seated heel toe raises/heel raises 2x 10 Seated towel scrunches and arch lifts 2x10 Seated ankle Inv/Ev windshield wipers 2x10  Updated HEP      PATIENT EDUCATION:  Education details: HEP Person educated: Patient Education method: Programmer, multimedia, Facilities manager, Actor cues, Verbal cues Education comprehension: verbalized understanding, returned demonstration, verbal cues required, tactile cues required, and needs further education  HOME EXERCISE PROGRAM: Access Code: [email or web address]   ASSESSMENT: CLINICAL IMPRESSION: Pt reports prednisone has helped and her pain is less, 2/10. MD said it was okay to come back out of boot and also told her to stop using the cane. Pt arrives without AD and antalgic gait pattern. Recommended continued cane use as she normalizes her gait pattern. Progressed to balance challenges today with pt holding tandem stance >45 seconds. With unlevel surface limited to 10 sec tandem stance. Patient would benefit from continued skilled PT to  progress her mobility and strength in order to reduce pain and maximize functional ability. Will check FOTO next session.   EVAL: Patient is a 66 y.o. female who was seen today for physical therapy evaluation and treatment for right ankle pain and stiffness following surgery on 10/07/2022. She does exhibit limitations in her right ankle motion and strength, as well as increased ankle edema and is limited with walking due to weight bearing restrictions and use of CAM boot She had fusion of subtalar joint so expect significant limitations with inversion/eversion while she still has use of talocrural joint so will focus on improving ankle dorsiflexon and plantarflexion mobility and strengthening until weight bearing restrictions removed.    OBJECTIVE IMPAIRMENTS: Abnormal gait, decreased activity tolerance, decreased balance, difficulty walking, decreased ROM, decreased strength, increased edema, impaired flexibility, and pain.   ACTIVITY LIMITATIONS: standing, squatting, stairs, transfers, bathing, dressing, and locomotion level  PARTICIPATION LIMITATIONS: meal prep, cleaning, laundry, driving, shopping, and community activity  PERSONAL FACTORS: Fitness, Past/current experiences, and Time since onset of injury/illness/exacerbation are also affecting patient's functional outcome.    GOALS: Goals reviewed with patient? Yes  SHORT TERM GOALS: Target date: 01/06/2023  Patient will be I with initial HEP in order to progress with therapy. Baseline: HEP provided at eval Goal status: MET  2.  Patient will demonstrate normalized gait mechanics with LRAD in order to improve mobility and community access Baseline: see limitations above 01/04/23: pt to acquire [facility]- currently has prong cane which she does not use Goal status: ONGOING  3.  Patient will demonstrate >/= 5 deg right ankle DF in order to normalize gait Baseline: lacking 5 deg 12/23/22: 6d Goal status: MET  LONG TERM GOALS: Target date:  02/03/2023  Patient will be I with final HEP to maintain progress from PT. Baseline: HEP provided at eval Goal status: INITIAL  2.  Patient will report >/= 52% status on FOTO to indicate improved functional ability. Baseline: 34% functional status Goal status: INITIAL  3.  Patient will demonstrate right ankle DF/PF strength >/= 4/5 MMT in order to improve gait and weight bearing tolerance Baseline: see limitations above Goal status: INITIAL  4.  Patient will report no limitations with community ambulation using LRAD in order to maximize her level of function Baseline: limited with household mobility Goal status: INITIAL   PLAN: PT FREQUENCY: 1-2x/week  PT DURATION: 8 weeks  PLANNED INTERVENTIONS: Therapeutic  exercises, Therapeutic activity, Neuromuscular re-education, Balance training, Gait training, Patient/Family education, Self Care, Joint mobilization, Aquatic Therapy, Dry Needling, Cryotherapy, Moist heat, scar mobilization, Taping, Vasopneumatic device, Manual therapy, and Re-evaluation  PLAN FOR NEXT SESSION: Review HEP and progress PRN, right ankle stretching for DF/PF, ankle and foot intrinsic strengthening, gait training and progression to WBAT as allowed, balance and ankle stability training   Jannette Spanner, PTA 01/25/23 2:59 PM Phone: [phone] Fax: [phone]

## 2023-01-26 ENCOUNTER — Encounter: Payer: Self-pay | Admitting: Physical Therapy

## 2023-01-26 ENCOUNTER — Ambulatory Visit: Payer: Medicare Other | Admitting: Physical Therapy

## 2023-01-26 DIAGNOSIS — M6281 Muscle weakness (generalized): Secondary | ICD-10-CM

## 2023-01-26 DIAGNOSIS — R2689 Other abnormalities of gait and mobility: Secondary | ICD-10-CM | POA: Diagnosis not present

## 2023-01-26 DIAGNOSIS — M25571 Pain in right ankle and joints of right foot: Secondary | ICD-10-CM

## 2023-01-26 NOTE — Therapy (Addendum)
OUTPATIENT PHYSICAL THERAPY TREATMENT NOTE/PROGRESS NOTE   Progress Note Reporting Period 12/09/2022 to 01/26/2023  See note below for Objective Data and Assessment of Progress/Goals.      Patient Name: [name] MRN: [id number] DOB:1955-09-14, 67 y.o., female Today's Date: 01/26/2023   END OF SESSION:  PT End of Session - 01/26/23 1153     Visit Number 10    Number of Visits 17    Date for PT Re-Evaluation 02/03/23    Authorization Type [employer] MCR    Authorization Time Period 12/23/2022 - 02/03/2023    Authorization - Visit Number 9    Authorization - Number of Visits 12    PT Start Time 1150    PT Stop Time 1245    PT Time Calculation (min) 55 min                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   HTN (hypertension)    Joint pain    Lactose intolerance    Multiple food allergies    OA (osteoarthritis)    Reflux    Sjogren's syndrome (HCC)    SOB (shortness of breath)    Swallowing difficulty    Thyroid condition    Vitamin D deficiency    Past Surgical History:  Procedure Laterality Date   APPENDECTOMY     BACK SURGERY  03/12/2010   L5   BREAST SURGERY     BIOPSY   HERNIA REPAIR     groin bilateral   NECK SURGERY     PELVIC LAPAROSCOPY  [id number]   PILONIDAL CYST EXCISION     SALPINGOOPHORECTOMY  10/11/2002   SCOPE LSO-LEFT SEROUS CYSTADENOMA   SALPINGOOPHORECTOMY  04/12/2005   RSO/TOA   SHOULDER SURGERY Right 01/2018   TONSILLECTOMY AND ADENOIDECTOMY     TUBAL LIGATION     VAGINAL HYSTERECTOMY  01/10/2005   [facility], POSTERIOR REPAIR   Patient Active Problem List   Diagnosis Date Noted   Dry mouth 06/24/2022   Joint swelling 06/24/2022   Other specified abnormal immunological findings in serum 06/24/2022   Pain in limb 06/24/2022   Insulin resistance  05/19/2022   Low mean corpuscular hemoglobin concentration (MCHC) 05/19/2022   Other fatigue 05/05/2022   SOBOE (shortness of breath on exertion) 05/05/2022   Right foot pain 05/05/2022   Depression screen 05/05/2022   Arthritis 03/23/2022   Hyperlipidemia associated with type 2 diabetes mellitus (HCC) 12/24/2021   Essential hypertension 10/21/2021   Type 2 diabetes mellitus with hypoglycemia without coma, without long-term current use of insulin (HCC) 10/21/2021   Primary osteoarthritis involving multiple joints 10/21/2021   Mild intermittent asthma without complication 10/21/2021   History of herpes genitalis 10/21/2021   History of gout 10/21/2021   Gastroesophageal reflux disease without esophagitis 10/21/2021   Morbid obesity (HCC) with starting BMI 35 10/21/2021   Persistent cough 04/20/2021   Chronic vertigo 12/04/2020   COVID-19 04/16/2020   Neuropathic pain 04/12/2020   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   Gout 12/19/2019   Carotid artery stenosis 11/24/2019   Multiple joint pain 11/24/2019   Allergy to food 05/05/2018   Non-toxic nodular goiter 08/31/2017   Reflux    Asthma  Sjogren syndrome with inflammatory arthritis (HCC)     PCP: Marcine Matar, MD  REFERRING PROVIDER: Raquel James, PA-C   REFERRING DIAG: Pain in right ankle and joints of right foot   THERAPY DIAG:  Pain in right ankle and joints of right foot  Other abnormalities of gait and mobility  Muscle weakness (generalized)  Rationale for Evaluation and Treatment: Rehabilitation  ONSET DATE: 10/07/2022   SUBJECTIVE:  SUBJECTIVE STATEMENT:  2/10 Lateral foot. Finished predisone. Did well after yesterdays treatment until is started aching last night. Right knee is giving me some trouble. The boot irritated the knee.    PERTINENT HISTORY: DOS: 10/07/2022 Right triple arthrodesis (subtalar joint, the talonavicular joint, and the calcaneocuboid joint) gastrocnemius recession  peroneal tendon lengthening with tibial bone marrow aspirate  PAIN:  Are you having pain?  NPRS scale: 2/10, sore Pain location: Right ankle  PRECAUTIONS: Fall  WEIGHT BEARING RESTRICTIONS: WBAT in ASO brace per surgeon 12/28/2022  PATIENT GOALS: Get back to walking without limitation    OBJECTIVE:  PATIENT SURVEYS:  FOTO 34% functional status 01/26/23: FOTO 52%  EDEMA:  Figure 8: right 56 cm, left 52 cm 01/11/23: Figure 52 cm Right   MUSCLE LENGTH: Calf flexibility deficit  PALPATION: Patient reports tenderness over incisions and portal sites  LOWER EXTREMITY ROM:  Active ROM Right eval Left eval Rt 12/23/22 RT 12/28/22  Hip flexion      Hip extension      Hip abduction      Hip adduction      Hip internal rotation      Hip external rotation      Knee flexion      Knee extension      Ankle dorsiflexion Lacking 5  6d 10d  Ankle plantarflexion 30   30d  Ankle inversion 0     Ankle eversion 0      (Blank rows = not tested)  LOWER EXTREMITY MMT:  MMT Right eval Left eval  Hip flexion    Hip extension    Hip abduction    Hip adduction    Hip internal rotation    Hip external rotation    Knee flexion    Knee extension    Ankle dorsiflexion 4   Ankle plantarflexion -   Ankle inversion -   Ankle eversion -    (Blank rows = not tested)  FUNCTIONAL TESTS:  Not assessed  GAIT: Distance walked: 50 ft Assistive device utilized: Environmental consultant - 2 wheeled Level of assistance: Modified independence Comments: Step-to gait pattern with PWB on right   TODAY'S TREATMENT: [facility] Adult PT Treatment:                                                DATE: 01/26/23 Therapeutic Exercise: Nustep LE only x 5 minutes Wooden Rocker x 1 minute 6 inch step taps while standing on AIREX, progressed to no UE assist 6 inch stepup right x 10 wth 1 UE A/P blue rocking 1 minute without UE Tandem stance RLE back , LLE AIREX Bilateral heel raise  Gastroc stretch Supine QS into  towel  SLR 5 x 2 each  Hip abduction x 10 each  Bridge x 10   Modalities: Vasopneumatic (Game Ready): Location: Right ankle Time: 15 min Temperature: 34 deg Pressure: Medium    [facility] Adult PT Treatment:  DATE: 01/25/23 Therapeutic Exercise: Tandem stance RLE back , LLE AIREX Stepping on and off AIREX pad Alternating AIREX taps standing without UE A/ P rocking on Blue rocker board without UE Standing gastroc stretch Standing ankle DF stretch Sated Baps L3 CW, CCW Toe scrunches  Seated heel raise 5# on knee 10 x 2  Modalities: Vasopneumatic (Game Ready): Location: Right ankle Time: 15 min Temperature: 34 deg Pressure: Medium    [facility] Adult PT Treatment:                                                DATE: 01/13/23 Therapeutic Exercise: Towel scrunches Towel slides Evinv Seated heel and toe raises Manual Therapy: STW to gastroc  Passive stretch DF Contract relax DF stretch  Therapeutic Activity: Performed in ASO brace Narrow to staggered stance on AIREX with head turns/ trunk rotations.  Tandem stance level surface 30 +  Modalities: Vasopneumatic (Game Ready): Location: Right ankle Time: 15 min Temperature: 34 deg Pressure: Medium    [facility] Adult PT Treatment:                                                DATE: 01/11/23  Therapeutic Exercise: Therapeutic Exercise: Towel scrunches Towel slides Evinv Seated heel and toe raises Long sitting PF Green band  Seated DF GTB Seated wooden rocker board  Seated Baps L3 circles x 10 each direction.  Seated Towel stretch for gastroc and soleus Self toe stretches, self DF and PF stretches in Figure 4 position  Modalities: Vasopneumatic (Game Ready): Location: Right ankle Time: 15 min Temperature: 34 deg Pressure: Medium       PATIENT EDUCATION:  Education details: HEP Person educated: Patient Education method: Programmer, multimedia, Demonstration, Actor cues, Verbal  cues Education comprehension: verbalized understanding, returned demonstration, verbal cues required, tactile cues required, and needs further education  HOME EXERCISE PROGRAM: Access Code: [email or web address]   ASSESSMENT: CLINICAL IMPRESSION: Pt arrives with improved gait, no AD. Able to progress with bilateral heel raises for PF strengthening, no increased pain, only calf fatigue. Continued static and dynamic balance challenges on unlevel surfaces with good tolerance. FOTO score improved to target, LTG#2 met.  Pt has recently weaned off Cam boot after a pain exacerbation. MD has prescribed prednisone to decrease inflammation which has allowed patient to return to ASO and improved gait tolerance.  Patient would benefit from continued skilled PT to progress her mobility and strength in order to reduce pain and maximize functional ability.   EVAL: Patient is a 67 y.o. female who was seen today for physical therapy evaluation and treatment for right ankle pain and stiffness following surgery on 10/07/2022. She does exhibit limitations in her right ankle motion and strength, as well as increased ankle edema and is limited with walking due to weight bearing restrictions and use of CAM boot She had fusion of subtalar joint so expect significant limitations with inversion/eversion while she still has use of talocrural joint so will focus on improving ankle dorsiflexon and plantarflexion mobility and strengthening until weight bearing restrictions removed.    OBJECTIVE IMPAIRMENTS: Abnormal gait, decreased activity tolerance, decreased balance, difficulty walking, decreased ROM, decreased strength, increased edema, impaired flexibility, and pain.   ACTIVITY LIMITATIONS: standing, squatting, stairs,  transfers, bathing, dressing, and locomotion level  PARTICIPATION LIMITATIONS: meal prep, cleaning, laundry, driving, shopping, and community activity  PERSONAL FACTORS: Fitness, Past/current experiences, and Time since  onset of injury/illness/exacerbation are also affecting patient's functional outcome.    GOALS: Goals reviewed with patient? Yes  SHORT TERM GOALS: Target date: 01/06/2023  Patient will be I with initial HEP in order to progress with therapy. Baseline: HEP provided at eval Goal status: MET  2.  Patient will demonstrate normalized gait mechanics with LRAD in order to improve mobility and community access Baseline: see limitations above 01/04/23: pt to acquire [facility]- currently has prong cane which she does not use 01/26/23: normalized gait mechanics with SPC Goal status: MET  3.  Patient will demonstrate >/= 5 deg right ankle DF in order to normalize gait Baseline: lacking 5 deg 12/23/22: 6d Goal status: MET  LONG TERM GOALS: Target date: 02/03/2023  Patient will be I with final HEP to maintain progress from PT. Baseline: HEP provided at eval Goal status: ONGOING  2.  Patient will report >/= 52% status on FOTO to indicate improved functional ability. Baseline: 34% functional status 01/26/23: 52% Goal status: MET  3.  Patient will demonstrate right ankle DF/PF strength >/= 4/5 MMT in order to improve gait and weight bearing tolerance Baseline: see limitations above 01/26/23: PF strength remains weak Goal status: ONGOING  4.  Patient will report no limitations with community ambulation using LRAD in order to maximize her level of function Baseline: limited with household mobility 01/26/23: community ambulation with SPC, no limitation with household ambulation Goal status: ONGOING   PLAN: PT FREQUENCY: 1-2x/week  PT DURATION: 8 weeks  PLANNED INTERVENTIONS: Therapeutic exercises, Therapeutic activity, Neuromuscular re-education, Balance training, Gait training, Patient/Family education, Self Care, Joint mobilization, Aquatic Therapy, Dry Needling, Cryotherapy, Moist heat, scar mobilization, Taping, Vasopneumatic device, Manual therapy, and Re-evaluation  PLAN FOR NEXT  SESSION: Review HEP and progress PRN, right ankle stretching for DF/PF, ankle and foot intrinsic strengthening, gait training and progression to WBAT as allowed, balance and ankle stability training   Jannette Spanner, PTA 01/26/23 12:54 PM Phone: [phone] Fax: [phone]     Rosana Hoes, PT, DPT, LAT, ATC 02/01/23  3:25 PM Phone: [phone] Fax: [phone]

## 2023-01-28 DIAGNOSIS — H16223 Keratoconjunctivitis sicca, not specified as Sjogren's, bilateral: Secondary | ICD-10-CM | POA: Diagnosis not present

## 2023-01-28 DIAGNOSIS — H5211 Myopia, right eye: Secondary | ICD-10-CM | POA: Diagnosis not present

## 2023-02-01 ENCOUNTER — Encounter: Payer: Self-pay | Admitting: Physical Therapy

## 2023-02-01 ENCOUNTER — Ambulatory Visit: Payer: Medicare Other | Admitting: Physical Therapy

## 2023-02-01 DIAGNOSIS — R2689 Other abnormalities of gait and mobility: Secondary | ICD-10-CM

## 2023-02-01 DIAGNOSIS — M0609 Rheumatoid arthritis without rheumatoid factor, multiple sites: Secondary | ICD-10-CM | POA: Diagnosis not present

## 2023-02-01 DIAGNOSIS — M2560 Stiffness of unspecified joint, not elsewhere classified: Secondary | ICD-10-CM | POA: Diagnosis not present

## 2023-02-01 DIAGNOSIS — M6281 Muscle weakness (generalized): Secondary | ICD-10-CM | POA: Diagnosis not present

## 2023-02-01 DIAGNOSIS — M3505 Sjogren syndrome with inflammatory arthritis: Secondary | ICD-10-CM | POA: Diagnosis not present

## 2023-02-01 DIAGNOSIS — R5383 Other fatigue: Secondary | ICD-10-CM | POA: Diagnosis not present

## 2023-02-01 DIAGNOSIS — M109 Gout, unspecified: Secondary | ICD-10-CM | POA: Diagnosis not present

## 2023-02-01 DIAGNOSIS — M25571 Pain in right ankle and joints of right foot: Secondary | ICD-10-CM | POA: Diagnosis not present

## 2023-02-01 DIAGNOSIS — M1991 Primary osteoarthritis, unspecified site: Secondary | ICD-10-CM | POA: Diagnosis not present

## 2023-02-01 DIAGNOSIS — M2540 Effusion, unspecified joint: Secondary | ICD-10-CM | POA: Diagnosis not present

## 2023-02-01 NOTE — Therapy (Signed)
OUTPATIENT PHYSICAL THERAPY TREATMENT NOTE    Patient Name: [name] MRN: [id number] DOB:[birth year], 67 y.o., female Today's Date: 02/01/2023   END OF SESSION:  PT End of Session - 02/01/23 0940     Visit Number 11    Number of Visits 17    Date for PT Re-Evaluation 02/03/23    Authorization Type [employer] MCR    Authorization Time Period 12/23/2022 - 02/03/2023    Authorization - Visit Number 10    Authorization - Number of Visits 12    Progress Note Due on Visit 20    PT Start Time 0937    PT Stop Time 1015    PT Time Calculation (min) 38 min                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   HTN (hypertension)    Joint pain    Lactose intolerance    Multiple food allergies    OA (osteoarthritis)    Reflux    Sjogren's syndrome (HCC)    SOB (shortness of breath)    Swallowing difficulty    Thyroid condition    Vitamin D deficiency    Past Surgical History:  Procedure Laterality Date   APPENDECTOMY     BACK SURGERY  03/12/2010   L5   BREAST SURGERY     BIOPSY   HERNIA REPAIR     groin bilateral   NECK SURGERY     PELVIC LAPAROSCOPY  [id number]   PILONIDAL CYST EXCISION     SALPINGOOPHORECTOMY  10/11/2002   SCOPE LSO-LEFT SEROUS CYSTADENOMA   SALPINGOOPHORECTOMY  04/12/2005   RSO/TOA   SHOULDER SURGERY Right 01/2018   TONSILLECTOMY AND ADENOIDECTOMY     TUBAL LIGATION     VAGINAL HYSTERECTOMY  01/10/2005   TVH, POSTERIOR REPAIR   Patient Active Problem List   Diagnosis Date Noted   Dry mouth 06/24/2022   Joint swelling 06/24/2022   Other specified abnormal immunological findings in serum 06/24/2022   Pain in limb 06/24/2022   Insulin resistance 05/19/2022   Low mean corpuscular hemoglobin concentration (MCHC) 05/19/2022   Other fatigue 05/05/2022   SOBOE  (shortness of breath on exertion) 05/05/2022   Right foot pain 05/05/2022   Depression screen 05/05/2022   Arthritis 03/23/2022   Hyperlipidemia associated with type 2 diabetes mellitus (HCC) 12/24/2021   Essential hypertension 10/21/2021   Type 2 diabetes mellitus with hypoglycemia without coma, without long-term current use of insulin (HCC) 10/21/2021   Primary osteoarthritis involving multiple joints 10/21/2021   Mild intermittent asthma without complication 10/21/2021   History of herpes genitalis 10/21/2021   History of gout 10/21/2021   Gastroesophageal reflux disease without esophagitis 10/21/2021   Morbid obesity (HCC) with starting BMI 35 10/21/2021   Persistent cough 04/20/2021   Chronic vertigo 12/04/2020   COVID-19 04/16/2020   Neuropathic pain 04/12/2020   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   Gout 12/19/2019   Carotid artery stenosis 11/24/2019   Multiple joint pain 11/24/2019   Allergy to food 05/05/2018   Non-toxic nodular goiter 08/31/2017   Reflux    Asthma    Sjogren syndrome with inflammatory arthritis (HCC)     PCP: Marcine Matar,  MD  REFERRING PROVIDER: Raquel James, PA-C   REFERRING DIAG: Pain in right ankle and joints of right foot   THERAPY DIAG:  Pain in right ankle and joints of right foot  Other abnormalities of gait and mobility  Rationale for Evaluation and Treatment: Rehabilitation  ONSET DATE: 10/07/2022   SUBJECTIVE:  SUBJECTIVE STATEMENT:  No pain on arrival. Pt reports her limitations are more in her back and knees, not the foot.    PERTINENT HISTORY: DOS: 10/07/2022 Right triple arthrodesis (subtalar joint, the talonavicular joint, and the calcaneocuboid joint) gastrocnemius recession peroneal tendon lengthening with tibial bone marrow aspirate  PAIN:  Are you having pain?  NPRS scale: 0/10, sore Pain location: Right ankle  PRECAUTIONS: Fall  WEIGHT BEARING RESTRICTIONS: WBAT in ASO brace per surgeon  12/28/2022  PATIENT GOALS: Get back to walking without limitation    OBJECTIVE:  PATIENT SURVEYS:  FOTO 34% functional status 01/26/23: FOTO 52%  EDEMA:  Figure 8: right 56 cm, left 52 cm 01/11/23: Figure 52 cm Right   MUSCLE LENGTH: Calf flexibility deficit  PALPATION: Patient reports tenderness over incisions and portal sites  LOWER EXTREMITY ROM:  Active ROM Right eval Left eval Rt 12/23/22 RT 12/28/22  Hip flexion      Hip extension      Hip abduction      Hip adduction      Hip internal rotation      Hip external rotation      Knee flexion      Knee extension      Ankle dorsiflexion Lacking 5  6d 10d  Ankle plantarflexion 30   30d  Ankle inversion 0     Ankle eversion 0      (Blank rows = not tested)  LOWER EXTREMITY MMT:  MMT Right eval Left eval  Hip flexion    Hip extension    Hip abduction    Hip adduction    Hip internal rotation    Hip external rotation    Knee flexion    Knee extension    Ankle dorsiflexion 4   Ankle plantarflexion -   Ankle inversion -   Ankle eversion -    (Blank rows = not tested)  FUNCTIONAL TESTS:  Not assessed  GAIT: Distance walked: 50 ft Assistive device utilized: Environmental consultant - 2 wheeled Level of assistance: Modified independence Comments: Step-to gait pattern with PWB on right   TODAY'S TREATMENT: [facility] Adult PT Treatment:                                                DATE: 02/01/23 Therapeutic Exercise: Nustep Le only x 5 min Wooden Rocker  x 1 minute  SLR x 10 each  Hip abduction x 10 each Bridge x 10  Updated HEP  Neuromuscular re-ed: Tandem Stance  Narrow stance with EC Tandem stance RLE back , LLE AIREX Therapeutic Activity: Side stepping, retro stepping , forward tandem in // bars without UE 180 and 360 turns without UE    [facility] Adult PT Treatment:                                                DATE: 01/26/23 Therapeutic Exercise: Nustep LE  only x 5 minutes Wooden Rocker x 1 minute 6 inch  step taps while standing on AIREX, progressed to no UE assist 6 inch stepup right x 10 wth 1 UE A/P blue rocking 1 minute without UE Tandem stance RLE back , LLE AIREX Bilateral heel raise  Gastroc stretch Supine QS into towel  SLR 5 x 2 each  Hip abduction x 10 each  Bridge x 10   Modalities: Vasopneumatic (Game Ready): Location: Right ankle Time: 15 min Temperature: 34 deg Pressure: Medium    [facility] Adult PT Treatment:                                                DATE: 01/25/23 Therapeutic Exercise: Tandem stance RLE back , LLE AIREX Stepping on and off AIREX pad Alternating AIREX taps standing without UE A/ P rocking on Blue rocker board without UE Standing gastroc stretch Standing ankle DF stretch Sated Baps L3 CW, CCW Toe scrunches  Seated heel raise 5# on knee 10 x 2  Modalities: Vasopneumatic (Game Ready): Location: Right ankle Time: 15 min Temperature: 34 deg Pressure: Medium    [facility] Adult PT Treatment:                                                DATE: 01/13/23 Therapeutic Exercise: Towel scrunches Towel slides Evinv Seated heel and toe raises Manual Therapy: STW to gastroc  Passive stretch DF Contract relax DF stretch  Therapeutic Activity: Performed in ASO brace Narrow to staggered stance on AIREX with head turns/ trunk rotations.  Tandem stance level surface 30 +  Modalities: Vasopneumatic (Game Ready): Location: Right ankle Time: 15 min Temperature: 34 deg Pressure: Medium    [facility] Adult PT Treatment:                                                DATE: 01/11/23  Therapeutic Exercise: Therapeutic Exercise: Towel scrunches Towel slides Evinv Seated heel and toe raises Long sitting PF Green band  Seated DF GTB Seated wooden rocker board  Seated Baps L3 circles x 10 each direction.  Seated Towel stretch for gastroc and soleus Self toe stretches, self DF and PF stretches in Figure 4 position  Modalities: Vasopneumatic (Game  Ready): Location: Right ankle Time: 15 min Temperature: 34 deg Pressure: Medium       PATIENT EDUCATION:  Education details: HEP Person educated: Patient Education method: Programmer, multimedia, Demonstration, Actor cues, Verbal cues Education comprehension: verbalized understanding, returned demonstration, verbal cues required, tactile cues required, and needs further education  HOME EXERCISE PROGRAM: Access Code: [email or web address]   ASSESSMENT: CLINICAL IMPRESSION: Pt arrives with improved gait, no AD. Pt reports she is not limited with community ambulation, no longer using SPC full time. Updated HEP to include static balance and LE strengthening. Pt is on target to finish at her next visit. She has met most LTGs and will continue her HEP.    EVAL: Patient is a 67 y.o. female who was seen today for physical therapy evaluation and treatment for right ankle pain and stiffness following surgery on 10/07/2022. She  does exhibit limitations in her right ankle motion and strength, as well as increased ankle edema and is limited with walking due to weight bearing restrictions and use of CAM boot She had fusion of subtalar joint so expect significant limitations with inversion/eversion while she still has use of talocrural joint so will focus on improving ankle dorsiflexon and plantarflexion mobility and strengthening until weight bearing restrictions removed.    OBJECTIVE IMPAIRMENTS: Abnormal gait, decreased activity tolerance, decreased balance, difficulty walking, decreased ROM, decreased strength, increased edema, impaired flexibility, and pain.   ACTIVITY LIMITATIONS: standing, squatting, stairs, transfers, bathing, dressing, and locomotion level  PARTICIPATION LIMITATIONS: meal prep, cleaning, laundry, driving, shopping, and community activity  PERSONAL FACTORS: Fitness, Past/current experiences, and Time since onset of injury/illness/exacerbation are also affecting patient's functional outcome.     GOALS: Goals reviewed with patient? Yes  SHORT TERM GOALS: Target date: 01/06/2023  Patient will be I with initial HEP in order to progress with therapy. Baseline: HEP provided at eval Goal status: MET  2.  Patient will demonstrate normalized gait mechanics with LRAD in order to improve mobility and community access Baseline: see limitations above 01/04/23: pt to acquire [facility]- currently has prong cane which she does not use 01/26/23: normalized gait mechanics with SPC Goal status: MET  3.  Patient will demonstrate >/= 5 deg right ankle DF in order to normalize gait Baseline: lacking 5 deg 12/23/22: 6d Goal status: MET  LONG TERM GOALS: Target date: 02/03/2023  Patient will be I with final HEP to maintain progress from PT. Baseline: HEP provided at eval Goal status: ONGOING  2.  Patient will report >/= 52% status on FOTO to indicate improved functional ability. Baseline: 34% functional status 01/26/23: 52% Goal status: MET  3.  Patient will demonstrate right ankle DF/PF strength >/= 4/5 MMT in order to improve gait and weight bearing tolerance Baseline: see limitations above 01/26/23: PF strength remains weak Goal status: ONGOING  4.  Patient will report no limitations with community ambulation using LRAD in order to maximize her level of function Baseline: limited with household mobility 01/26/23: community ambulation with SPC, no limitation with household ambulation 02/01/23: Pt reports limitations are more from knees and back , not ankle Goal status: MET   PLAN: PT FREQUENCY: 1-2x/week  PT DURATION: 8 weeks  PLANNED INTERVENTIONS: Therapeutic exercises, Therapeutic activity, Neuromuscular re-education, Balance training, Gait training, Patient/Family education, Self Care, Joint mobilization, Aquatic Therapy, Dry Needling, Cryotherapy, Moist heat, scar mobilization, Taping, Vasopneumatic device, Manual therapy, and Re-evaluation  PLAN FOR NEXT SESSION: DC next  visit, review   Jannette Spanner, PTA 02/01/23 10:26 AM Phone: [phone] Fax: [phone]

## 2023-02-03 ENCOUNTER — Ambulatory Visit: Payer: Medicare Other | Admitting: Physical Therapy

## 2023-02-03 ENCOUNTER — Encounter: Payer: Self-pay | Admitting: Physical Therapy

## 2023-02-03 DIAGNOSIS — M6281 Muscle weakness (generalized): Secondary | ICD-10-CM | POA: Diagnosis not present

## 2023-02-03 DIAGNOSIS — M25571 Pain in right ankle and joints of right foot: Secondary | ICD-10-CM

## 2023-02-03 DIAGNOSIS — R2689 Other abnormalities of gait and mobility: Secondary | ICD-10-CM | POA: Diagnosis not present

## 2023-02-03 NOTE — Therapy (Addendum)
OUTPATIENT PHYSICAL THERAPY TREATMENT NOTE  DISCHARGE    Patient Name: RIE WENDLER MRN: [id number] DOB:1955-07-10, 67 y.o., female Today's Date: 02/03/2023   END OF SESSION:  PT End of Session - 02/03/23 1020     Visit Number 12    Number of Visits 17    Date for PT Re-Evaluation 02/03/23    Authorization Type [employer] MCR    Authorization Time Period 12/23/2022 - 02/03/2023    Authorization - Visit Number 11    Authorization - Number of Visits 12    Progress Note Due on Visit 20    PT Start Time 1025    PT Stop Time 1050    PT Time Calculation (min) 25 min                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   HTN (hypertension)    Joint pain    Lactose intolerance    Multiple food allergies    OA (osteoarthritis)    Reflux    Sjogren's syndrome (HCC)    SOB (shortness of breath)    Swallowing difficulty    Thyroid condition    Vitamin D deficiency    Past Surgical History:  Procedure Laterality Date   APPENDECTOMY     BACK SURGERY  03/12/2010   L5   BREAST SURGERY     BIOPSY   HERNIA REPAIR     groin bilateral   NECK SURGERY     PELVIC LAPAROSCOPY  9528,4132   PILONIDAL CYST EXCISION     SALPINGOOPHORECTOMY  10/11/2002   SCOPE LSO-LEFT SEROUS CYSTADENOMA   SALPINGOOPHORECTOMY  04/12/2005   RSO/TOA   SHOULDER SURGERY Right 01/2018   TONSILLECTOMY AND ADENOIDECTOMY     TUBAL LIGATION     VAGINAL HYSTERECTOMY  01/10/2005   TVH, POSTERIOR REPAIR   Patient Active Problem List   Diagnosis Date Noted   Dry mouth 06/24/2022   Joint swelling 06/24/2022   Other specified abnormal immunological findings in serum 06/24/2022   Pain in limb 06/24/2022   Insulin resistance 05/19/2022   Low mean corpuscular hemoglobin concentration (MCHC) 05/19/2022   Other fatigue  05/05/2022   SOBOE (shortness of breath on exertion) 05/05/2022   Right foot pain 05/05/2022   Depression screen 05/05/2022   Arthritis 03/23/2022   Hyperlipidemia associated with type 2 diabetes mellitus (HCC) 12/24/2021   Essential hypertension 10/21/2021   Type 2 diabetes mellitus with hypoglycemia without coma, without long-term current use of insulin (HCC) 10/21/2021   Primary osteoarthritis involving multiple joints 10/21/2021   Mild intermittent asthma without complication 10/21/2021   History of herpes genitalis 10/21/2021   History of gout 10/21/2021   Gastroesophageal reflux disease without esophagitis 10/21/2021   Morbid obesity (HCC) with starting BMI 35 10/21/2021   Persistent cough 04/20/2021   Chronic vertigo 12/04/2020   COVID-19 04/16/2020   Neuropathic pain 04/12/2020   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   Gout 12/19/2019   Carotid artery stenosis 11/24/2019   Multiple joint pain 11/24/2019   Allergy to food 05/05/2018   Non-toxic nodular goiter 08/31/2017   Reflux    Asthma    Sjogren syndrome with inflammatory arthritis (HCC)     PCP: Laural Benes,  Binnie Rail, MD  REFERRING PROVIDER: Raquel James, PA-C   REFERRING DIAG: Pain in right ankle and joints of right foot   THERAPY DIAG:  Pain in right ankle and joints of right foot  Other abnormalities of gait and mobility  Rationale for Evaluation and Treatment: Rehabilitation  ONSET DATE: 10/07/2022   SUBJECTIVE:  SUBJECTIVE STATEMENT:  Intermittent low level lateral foot pain. Feels like the foot is waking up. It is still healing. I will continue my HEP    PERTINENT HISTORY: DOS: 10/07/2022 Right triple arthrodesis (subtalar joint, the talonavicular joint, and the calcaneocuboid joint) gastrocnemius recession peroneal tendon lengthening with tibial bone marrow aspirate  PAIN:  Are you having pain?  NPRS scale: 1-2/10, sore Pain location: Right ankle  PRECAUTIONS: Fall  WEIGHT BEARING  RESTRICTIONS: WBAT in ASO brace per surgeon 12/28/2022  PATIENT GOALS: Get back to walking without limitation    OBJECTIVE:  PATIENT SURVEYS:  FOTO 34% functional status 01/26/23: FOTO 52%  EDEMA:  Figure 8: right 56 cm, left 52 cm 01/11/23: Figure 52 cm Right   MUSCLE LENGTH: Calf flexibility deficit  PALPATION: Patient reports tenderness over incisions and portal sites  LOWER EXTREMITY ROM:  Active ROM Right eval Left eval Rt 12/23/22 RT 12/28/22  Hip flexion      Hip extension      Hip abduction      Hip adduction      Hip internal rotation      Hip external rotation      Knee flexion      Knee extension      Ankle dorsiflexion Lacking 5  6d 10d  Ankle plantarflexion 30   30d  Ankle inversion 0     Ankle eversion 0      (Blank rows = not tested)  LOWER EXTREMITY MMT:  MMT Right eval Left eval Right 02/03/23  Hip flexion     Hip extension     Hip abduction     Hip adduction     Hip internal rotation     Hip external rotation     Knee flexion     Knee extension     Ankle dorsiflexion 4  5  Ankle plantarflexion -  Bilat heel raises  Ankle inversion -    Ankle eversion -     (Blank rows = not tested)  FUNCTIONAL TESTS:  Not assessed  GAIT: Distance walked: 50 ft Assistive device utilized: Environmental consultant - 2 wheeled Level of assistance: Modified independence Comments: Step-to gait pattern with PWB on right   TODAY'S TREATMENT: [facility] Adult PT Treatment:                                                DATE: 02/03/23 Therapeutic Exercise: Nustep Le only x 5 min Wooden Rocker  x 1 minute  SLR x 10 each  Hip abduction x 10 each Bridge x 10  Right gastroc stretch Bilat heel raise x 10 Updated HEP  Neuromuscular re-ed: Tandem Stance  Narrow stance with EC SLS on airex 2 finger touch Tandem stance RLE back , LLE AIREX    [facility] Adult PT Treatment:  DATE: 02/01/23 Therapeutic Exercise: Arnoldo Morale only x 5  min Wooden Rocker  x 1 minute  SLR x 10 each  Hip abduction x 10 each Bridge x 10  Updated HEP  Neuromuscular re-ed: Tandem Stance  Narrow stance with EC Tandem stance RLE back , LLE AIREX Therapeutic Activity: Side stepping, retro stepping , forward tandem in // bars without UE 180 and 360 turns without UE    PATIENT EDUCATION:  Education details: HEP Person educated: Patient Education method: Explanation, Demonstration, Tactile cues, Verbal cues Education comprehension: verbalized understanding, returned demonstration, verbal cues required, tactile cues required, and needs further education  HOME EXERCISE PROGRAM: Access Code: [email or web address]   ASSESSMENT: CLINICAL IMPRESSION: Pt arrives with improved gait, no AD. Pt reports she is not limited with community ambulation, no longer using SPC full time. Reviewed advanced  HEP for include static balance and LE strengthening.  She has met most LTGs and will continue her HEP. PF remains weak and she plans to continue working on strengthening on her own. Discharge to HEP today.    EVAL: Patient is a 67 y.o. female who was seen today for physical therapy evaluation and treatment for right ankle pain and stiffness following surgery on 10/07/2022. She does exhibit limitations in her right ankle motion and strength, as well as increased ankle edema and is limited with walking due to weight bearing restrictions and use of CAM boot She had fusion of subtalar joint so expect significant limitations with inversion/eversion while she still has use of talocrural joint so will focus on improving ankle dorsiflexon and plantarflexion mobility and strengthening until weight bearing restrictions removed.    OBJECTIVE IMPAIRMENTS: Abnormal gait, decreased activity tolerance, decreased balance, difficulty walking, decreased ROM, decreased strength, increased edema, impaired flexibility, and pain.   ACTIVITY LIMITATIONS: standing, squatting, stairs, transfers,  bathing, dressing, and locomotion level  PARTICIPATION LIMITATIONS: meal prep, cleaning, laundry, driving, shopping, and community activity  PERSONAL FACTORS: Fitness, Past/current experiences, and Time since onset of injury/illness/exacerbation are also affecting patient's functional outcome.    GOALS: Goals reviewed with patient? Yes  SHORT TERM GOALS: Target date: 01/06/2023  Patient will be I with initial HEP in order to progress with therapy. Baseline: HEP provided at eval Goal status: MET  2.  Patient will demonstrate normalized gait mechanics with LRAD in order to improve mobility and community access Baseline: see limitations above 01/04/23: pt to acquire [facility]- currently has prong cane which she does not use 01/26/23: normalized gait mechanics with SPC Goal status: MET  3.  Patient will demonstrate >/= 5 deg right ankle DF in order to normalize gait Baseline: lacking 5 deg 12/23/22: 6d Goal status: MET  LONG TERM GOALS: Target date: 02/03/2023  Patient will be I with final HEP to maintain progress from PT. Baseline: HEP provided at eval Goal status: ONGOING  2.  Patient will report >/= 52% status on FOTO to indicate improved functional ability. Baseline: 34% functional status 01/26/23: 52% Goal status: MET  3.  Patient will demonstrate right ankle DF/PF strength >/= 4/5 MMT in order to improve gait and weight bearing tolerance Baseline: see limitations above 01/26/23: PF strength remains weak Goal status: PARTIALLY MET   4.  Patient will report no limitations with community ambulation using LRAD in order to maximize her level of function Baseline: limited with household mobility 01/26/23: community ambulation with SPC, no limitation with household ambulation 02/01/23: Pt reports limitations are more from knees and back , not ankle Goal status: MET  PLAN: PT FREQUENCY: 1-2x/week  PT DURATION: 8 weeks  PLANNED INTERVENTIONS: Therapeutic exercises, Therapeutic  activity, Neuromuscular re-education, Balance training, Gait training, Patient/Family education, Self Care, Joint mobilization, Aquatic Therapy, Dry Needling, Cryotherapy, Moist heat, scar mobilization, Taping, Vasopneumatic device, Manual therapy, and Re-evaluation  PLAN FOR NEXT SESSION: DC Today   Jannette Spanner, PTA 02/03/23 10:54 AM Phone: [phone] Fax: [phone]    PHYSICAL THERAPY DISCHARGE SUMMARY  Visits from Start of Care: 12  Current functional level related to goals / functional outcomes: See above   Remaining deficits: See above   Education / Equipment: HEP   Patient agrees to discharge. Patient goals were partially met. Patient is being discharged due to being pleased with the current functional level.  Rosana Hoes, PT, DPT, LAT, ATC 02/07/23  2:31 PM Phone: [phone] Fax: [phone]

## 2023-02-07 ENCOUNTER — Encounter: Payer: Self-pay | Admitting: Physical Therapy

## 2023-02-09 ENCOUNTER — Ambulatory Visit
Admission: RE | Admit: 2023-02-09 | Discharge: 2023-02-09 | Disposition: A | Discharge status: Home / Self Care | Payer: Medicare Other | Source: Ambulatory Visit | Attending: Internal Medicine | Admitting: Internal Medicine

## 2023-02-09 DIAGNOSIS — Z1231 Encounter for screening mammogram for malignant neoplasm of breast: Secondary | ICD-10-CM | POA: Diagnosis not present

## 2023-02-21 DIAGNOSIS — M76829 Posterior tibial tendinitis, unspecified leg: Secondary | ICD-10-CM | POA: Diagnosis not present

## 2023-02-21 DIAGNOSIS — M79671 Pain in right foot: Secondary | ICD-10-CM | POA: Diagnosis not present

## 2023-02-21 DIAGNOSIS — Z4889 Encounter for other specified surgical aftercare: Secondary | ICD-10-CM | POA: Diagnosis not present

## 2023-03-29 ENCOUNTER — Ambulatory Visit: Payer: Self-pay | Admitting: *Deleted

## 2023-03-29 ENCOUNTER — Other Ambulatory Visit: Payer: Self-pay

## 2023-03-29 ENCOUNTER — Emergency Department (HOSPITAL_COMMUNITY): Payer: Medicare Other

## 2023-03-29 ENCOUNTER — Emergency Department (HOSPITAL_COMMUNITY)
Admission: EM | Admit: 2023-03-29 | Discharge: 2023-03-29 | Disposition: A | Discharge status: Home / Self Care | Payer: Medicare Other | Attending: Emergency Medicine | Admitting: Emergency Medicine

## 2023-03-29 ENCOUNTER — Encounter (HOSPITAL_COMMUNITY): Payer: Self-pay

## 2023-03-29 DIAGNOSIS — R519 Headache, unspecified: Secondary | ICD-10-CM | POA: Diagnosis not present

## 2023-03-29 DIAGNOSIS — R42 Dizziness and giddiness: Secondary | ICD-10-CM | POA: Insufficient documentation

## 2023-03-29 DIAGNOSIS — R202 Paresthesia of skin: Secondary | ICD-10-CM | POA: Diagnosis not present

## 2023-03-29 DIAGNOSIS — H5509 Other forms of nystagmus: Secondary | ICD-10-CM | POA: Diagnosis not present

## 2023-03-29 DIAGNOSIS — E042 Nontoxic multinodular goiter: Secondary | ICD-10-CM | POA: Diagnosis not present

## 2023-03-29 DIAGNOSIS — G459 Transient cerebral ischemic attack, unspecified: Secondary | ICD-10-CM | POA: Insufficient documentation

## 2023-03-29 DIAGNOSIS — I6529 Occlusion and stenosis of unspecified carotid artery: Secondary | ICD-10-CM | POA: Diagnosis not present

## 2023-03-29 DIAGNOSIS — Z91040 Latex allergy status: Secondary | ICD-10-CM | POA: Insufficient documentation

## 2023-03-29 DIAGNOSIS — M542 Cervicalgia: Secondary | ICD-10-CM | POA: Diagnosis not present

## 2023-03-29 DIAGNOSIS — Z91010 Allergy to peanuts: Secondary | ICD-10-CM | POA: Diagnosis not present

## 2023-03-29 LAB — BASIC METABOLIC PANEL
Anion gap: 8 (ref 5–15)
BUN: 26 mg/dL — ABNORMAL HIGH (ref 8–23)
CO2: 29 mmol/L (ref 22–32)
Calcium: 9.2 mg/dL (ref 8.9–10.3)
Chloride: 100 mmol/L (ref 98–111)
Creatinine, Ser: 0.92 mg/dL (ref 0.44–1.00)
GFR, Estimated: >60 mL/min (ref >60)
Glucose, Bld: 109 mg/dL — ABNORMAL HIGH (ref 70–99)
Potassium: 3.6 mmol/L (ref 3.5–5.1)
Sodium: 137 mmol/L (ref 135–145)

## 2023-03-29 LAB — CBC
HCT: 41 % (ref 36.0–46.0)
Hemoglobin: 13.2 g/dL (ref 12.0–15.0)
MCH: 27.6 pg (ref 26.0–34.0)
MCHC: 32.2 g/dL (ref 30.0–36.0)
MCV: 85.6 fL (ref 80.0–100.0)
Platelets: 245 10*3/uL (ref 150–400)
RBC: 4.79 MIL/uL (ref 3.87–5.11)
RDW: 14.8 % (ref 11.5–15.5)
WBC: 4.4 10*3/uL (ref 4.0–10.5)
nRBC: 0 % (ref 0.0–0.2)

## 2023-03-29 MED ORDER — KETOROLAC TROMETHAMINE 30 MG/ML IJ SOLN
30.0000 mg | Freq: Once | INTRAMUSCULAR | Status: AC
  Administered 2023-03-29: 30 mg via INTRAVENOUS
  Filled 2023-03-29: qty 1

## 2023-03-29 MED ORDER — PREDNISONE 10 MG PO TABS: 50.0000 mg | ORAL_TABLET | Freq: Every day | ORAL | 0 refills | Status: AC

## 2023-03-29 MED ORDER — DEXAMETHASONE SODIUM PHOSPHATE 10 MG/ML IJ SOLN
10.0000 mg | Freq: Once | INTRAMUSCULAR | Status: AC
  Administered 2023-03-29: 10 mg via INTRAVENOUS
  Filled 2023-03-29: qty 1

## 2023-03-29 MED ORDER — MECLIZINE HCL 25 MG PO TABS
25.0000 mg | ORAL_TABLET | Freq: Once | ORAL | Status: AC
  Administered 2023-03-29: 25 mg via ORAL
  Filled 2023-03-29: qty 1

## 2023-03-29 MED ORDER — ONDANSETRON HCL 4 MG/2ML IJ SOLN
4.0000 mg | Freq: Once | INTRAMUSCULAR | Status: AC
  Administered 2023-03-29: 4 mg via INTRAVENOUS
  Filled 2023-03-29: qty 2

## 2023-03-29 MED ORDER — GADOBUTROL 1 MMOL/ML IV SOLN
10.0000 mL | Freq: Once | INTRAVENOUS | Status: AC | PRN
  Administered 2023-03-29: 10 mL via INTRAVENOUS

## 2023-03-29 MED ORDER — PROCHLORPERAZINE EDISYLATE 10 MG/2ML IJ SOLN
10.0000 mg | Freq: Once | INTRAMUSCULAR | Status: AC
  Administered 2023-03-29: 10 mg via INTRAVENOUS
  Filled 2023-03-29: qty 2

## 2023-03-29 NOTE — ED Notes (Signed)
Pt need assistance to stabilize self while walking.  Pt felt winded and tired when arriving back from restroom. Oxygen at 92%.

## 2023-03-29 NOTE — Discharge Instructions (Addendum)
Your neck pain and numbness may be related to an issue with the nerves in your neck or cervical spine.  I would recommend you follow-up with your neurosurgeon who has evaluated your spine in the past.  You can also follow-up with your primary care doctor.  I did prescribe you a short burst course of steroids called prednisone to take for the next 4 days, beginning tomorrow.  You can also continue your normal home medications.

## 2023-03-29 NOTE — ED Provider Notes (Signed)
[facility] AT [facility] Provider Note   CSN: [id number] Arrival date & time: 03/29/23  1113     History  Chief Complaint  Patient presents with   Dizziness    [name] is a 67 y.o. female with an ongoing history of vertigo presented to ED with complaint of vertigo, left-sided facial numbness, posterior headache and neck pain.  Patient reports that she has been having vertigo on and off for the past week and a half, although she has a prior history of vertigo, for which she takes Antivert.  She reports that she has had left posterior neck pain for about 3 to 4 weeks.  She states she cannot get comfortable at night.  The pain seems to pulse in the left backside of her neck up and towards her head and she has a posterior headache.  She reports that she woke up this morning around 7 or 7:30 AM and noted the left side of her face felt numb.  It felt normal yesterday evening.  She denies any prior history of stroke or TIA.  She denies any weakness, facial droop.  She states that 3 months ago she fell and struck her head on the ground, but did not have neck pain at the time.  Her daughter did however perform a massage of her shoulder in the past.  HPI     Home Medications Prior to Admission medications   Medication Sig Start Date End Date Taking? Authorizing Provider  predniSONE (DELTASONE) 10 MG tablet Take 5 tablets (50 mg total) by mouth daily with breakfast for 4 days. 03/30/23 04/03/23 Yes Yael Coppess, Kermit Balo, MD  acetaminophen (TYLENOL) 650 MG CR tablet Take 650 mg by mouth as needed.      [provider]  albuterol (VENTOLIN HFA) 108 (90 Base) MCG/ACT inhaler Inhale 2 puffs into the lungs every 6 (six) hours as needed for wheezing or shortness of breath. 10/20/21   Marcine Matar, MD  ascorbic acid (VITAMIN C) 500 MG tablet Take 500 mg by mouth daily.    [provider]  atorvastatin (LIPITOR) 10 MG tablet 1 tab PO Q  Mon/Wed/Frid 09/01/22   Marcine Matar, MD  b complex vitamins capsule Take 1 capsule by mouth daily. 1 chewable once daily.    [provider]  benzonatate (TESSALON) 200 MG capsule Take 1 capsule (200 mg total) by mouth 3 (three) times daily as needed for cough. 04/27/22   Marcine Matar, MD  BIOTIN PO Take by mouth.    [provider]  Calcium 500-2.5 MG-MCG CHEW Chew 2 tablets by mouth daily.    [provider]  Cetirizine HCl (ZYRTEC PO) Take 10 mg by mouth.     [provider]  Cholecalciferol (VITAMIN D PO) Take by mouth. TAKES 2000     [provider]  Cinnamon 500 MG TABS Take 2 tablets by mouth daily.    [provider]  Continuous Blood Gluc Receiver (FREESTYLE LIBRE 2 READER) DEVI Use to check blood sugar three times daily. Change sensors once every 14 days. E11.65 07/20/22   Marcine Matar, MD  Continuous Glucose Sensor (FREESTYLE LIBRE 2 SENSOR) MISC Use to check blood sugar three times daily. Change sensors once every 14 days. E11.65 12/31/22   Marcine Matar, MD  cyclobenzaprine (FLEXERIL) 10 MG tablet Take 1 tablet (10 mg total) by mouth 3 (three) times daily as needed. 12/31/22   Marcine Matar,  MD  cycloSPORINE (RESTASIS) 0.05 % ophthalmic emulsion 1 drop 2 (two) times daily.    [provider]  dexlansoprazole (DEXILANT) 60 MG capsule Take 1 capsule (60 mg total) by mouth every morning. 08/30/22   Marcine Matar, MD  diclofenac Sodium (VOLTAREN) 1 % GEL Apply 2 g topically daily. Once daily in the morning as needed.    [provider]  EPINEPHrine 0.3 mg/0.3 mL IJ SOAJ injection Inject 0.3 mg into the muscle as needed for anaphylaxis. 09/01/22   Marcine Matar, MD  ergocalciferol (VITAMIN D2) 1.25 MG (50000 UT) capsule Take 1 capsule (50,000 Units total) by mouth once a week. 08/30/22   Marcine Matar, MD  folic acid (FOLVITE) 1 MG tablet 1 (one) time each day at the same time. 11/02/22    [provider]  Glucose 15 g PACK Take by mouth. Chew up to 4 tablets prn.    [provider]  glucose blood (FREESTYLE LITE) test strip Use as instructed 04/27/22   Marcine Matar, MD  hydrochlorothiazide (HYDRODIURIL) 25 MG tablet Take 1 tablet (25 mg total) by mouth 2 (two) times daily. 08/30/22   Marcine Matar, MD  ipratropium-albuterol (DUONEB) 0.5-2.5 (3) MG/3ML SOLN Take 3 mLs by nebulization every 6 (six) hours as needed. 10/20/21   Marcine Matar, MD  Lancets (FREESTYLE) lancets Use as instructed 04/27/22   Marcine Matar, MD  meclizine (ANTIVERT) 25 MG tablet Take 1 tablet (25 mg total) by mouth 2 (two) times daily as needed for dizziness. 12/31/22   Marcine Matar, MD  meloxicam (MOBIC) 15 MG tablet Take 1 tablet (15 mg total) by mouth daily. 10/20/21   Marcine Matar, MD  methotrexate (RHEUMATREX) 2.5 MG tablet  11/02/22   [provider]  montelukast (SINGULAIR) 10 MG tablet Take 1 tablet (10 mg total) by mouth at bedtime. 04/27/22   Marcine Matar, MD  Multiple Vitamins-Iron (MULTIVITAMIN/IRON PO) Take by mouth.      [provider]  nebivolol (BYSTOLIC) 5 MG tablet Take 1 tablet (5 mg total) by mouth every morning. 08/30/22   Marcine Matar, MD  polyethylene glycol (MIRALAX / GLYCOLAX) 17 g packet Take 17 g by mouth daily.    [provider]  triamcinolone (NASACORT) 55 MCG/ACT nasal inhaler Place 2 sprays into the nose as needed.      [provider]  valACYclovir (VALTREX) 500 MG tablet Take 1 tablet (500 mg total) by mouth daily. 04/27/22   Marcine Matar, MD  vitamin E 180 MG (400 UNITS) capsule Take 400 Units by mouth in the morning and at bedtime.    [provider]      Allergies    Codeine, Duratuss [phenylephrine-guaifenesin], Erythromycin, Hydrocodone-acetaminophen, Iodine, Latex, Oxycodone, Peanuts [nuts], Penicillins, Shellfish allergy, Vicodin [hydrocodone-acetaminophen],  Augmentin [amoxicillin-pot clavulanate], and Phentermine    Review of Systems   Review of Systems  Physical Exam Updated Vital Signs BP 120/84 (BP Location: Left Arm)   Pulse 61   Temp 98.3 F (36.8 C) (Oral)   Resp 16   Ht 5\' 7"  (1.702 m)   Wt 94.8 kg   SpO2 100%   BMI 32.73 kg/m  Physical Exam Constitutional:      General: She is not in acute distress. HENT:     Head: Normocephalic and atraumatic.  Eyes:     Conjunctiva/sclera: Conjunctivae normal.     Pupils: Pupils are equal, round, and reactive to light.  Neck:  Comments: Tenderness tracking along left posterior neck, significant trigger point tenderness Cardiovascular:     Rate and Rhythm: Normal rate and regular rhythm.  Pulmonary:     Effort: Pulmonary effort is normal. No respiratory distress.  Abdominal:     General: There is no distension.     Tenderness: There is no abdominal tenderness.  Skin:    General: Skin is warm and dry.  Neurological:     Mental Status: She is alert. Mental status is at baseline.     Comments: Patient reports paresthesia on the left half of her face and the trigeminal distribution, no other cranial nerve deficits noted, no visual deficits, there is some mild left beating nystagmus of unilateral right-sided gaze  Psychiatric:        Mood and Affect: Mood normal.        Behavior: Behavior normal.     ED Results / Procedures / Treatments   Labs (all labs ordered are listed, but only abnormal results are displayed) Labs Reviewed  BASIC METABOLIC PANEL - Abnormal; Notable for the following components:      Result Value   Glucose, Bld 109 (*)    BUN 26 (*)    All other components within normal limits  CBC    EKG EKG Interpretation Date/Time:  Tuesday March 29 2023 11:24:40 EST Ventricular Rate:  71 PR Interval:  129 QRS Duration:  92 QT Interval:  399 QTC Calculation: 434 R Axis:   -23  Text Interpretation: Sinus rhythm Atrial premature complex Borderline left  axis deviation Confirmed by Alvester Chou [id number] on 03/29/2023 11:31:51 AM  Radiology MR BRAIN WO CONTRAST Result Date: 03/29/2023 CLINICAL DATA:  TIA, vertigo, left-sided facial paresthesias; arterial dissection suspected EXAM: MRI HEAD WITHOUT CONTRAST MRA NECK WITHOUT AND WITH CONTRAST TECHNIQUE: Multiplanar, multi-echo pulse sequences of the brain and surrounding structures were acquired without intravenous contrast. Angiographic images of the neck were acquired using MRA technique without and with intravenous contrast. Carotid stenosis measurements (when applicable) are obtained utilizing NASCET criteria, using the distal internal carotid diameter as the denominator. CONTRAST:  10mL GADAVIST GADOBUTROL 1 MMOL/ML IV SOLN COMPARISON:  None Available. FINDINGS: MRI HEAD FINDINGS Brain: No restricted diffusion to suggest acute or subacute infarct. No acute hemorrhage, mass, mass effect, or midline shift. No hydrocephalus or extra-axial collection. Pituitary and craniocervical junction within normal limits. No hemosiderin deposition to suggest remote hemorrhage. Normal cerebral volume. No significant small vessel ischemic disease. Vascular: Normal arterial flow voids. Skull and upper cervical spine: Normal marrow signal. Sinuses/Orbits: Clear paranasal sinuses. No acute finding in the orbits. Other: The mastoid air cells are well aerated. MRA NECK FINDINGS Aortic arch: Two-vessel arch with a common origin of the brachiocephalic and left common carotid arteries. Imaged portion shows no evidence of aneurysm or dissection. No significant stenosis of the major arch vessel origins. Right carotid system: No evidence of dissection, occlusion, or hemodynamically significant stenosis (greater than 50%). Left carotid system: No evidence of dissection, occlusion, or hemodynamically significant stenosis (greater than 50%). Vertebral arteries: No evidence of dissection, occlusion, or hemodynamically significant stenosis  (greater than 50%). Other: Multiple nodules in the thyroid, which was most recently evaluated with ultrasound on 06/01/2022. IMPRESSION: 1. No acute intracranial process. No evidence of acute or subacute infarct. 2. No hemodynamically significant stenosis in the neck. No evidence of dissection. Electronically Signed   By: Wiliam Ke M.D.   On: 03/29/2023 16:08   MR ANGIO NECK W WO CONTRAST Result Date: 03/29/2023 CLINICAL  DATA:  TIA, vertigo, left-sided facial paresthesias; arterial dissection suspected EXAM: MRI HEAD WITHOUT CONTRAST MRA NECK WITHOUT AND WITH CONTRAST TECHNIQUE: Multiplanar, multi-echo pulse sequences of the brain and surrounding structures were acquired without intravenous contrast. Angiographic images of the neck were acquired using MRA technique without and with intravenous contrast. Carotid stenosis measurements (when applicable) are obtained utilizing NASCET criteria, using the distal internal carotid diameter as the denominator. CONTRAST:  10mL GADAVIST GADOBUTROL 1 MMOL/ML IV SOLN COMPARISON:  None Available. FINDINGS: MRI HEAD FINDINGS Brain: No restricted diffusion to suggest acute or subacute infarct. No acute hemorrhage, mass, mass effect, or midline shift. No hydrocephalus or extra-axial collection. Pituitary and craniocervical junction within normal limits. No hemosiderin deposition to suggest remote hemorrhage. Normal cerebral volume. No significant small vessel ischemic disease. Vascular: Normal arterial flow voids. Skull and upper cervical spine: Normal marrow signal. Sinuses/Orbits: Clear paranasal sinuses. No acute finding in the orbits. Other: The mastoid air cells are well aerated. MRA NECK FINDINGS Aortic arch: Two-vessel arch with a common origin of the brachiocephalic and left common carotid arteries. Imaged portion shows no evidence of aneurysm or dissection. No significant stenosis of the major arch vessel origins. Right carotid system: No evidence of dissection,  occlusion, or hemodynamically significant stenosis (greater than 50%). Left carotid system: No evidence of dissection, occlusion, or hemodynamically significant stenosis (greater than 50%). Vertebral arteries: No evidence of dissection, occlusion, or hemodynamically significant stenosis (greater than 50%). Other: Multiple nodules in the thyroid, which was most recently evaluated with ultrasound on 06/01/2022. IMPRESSION: 1. No acute intracranial process. No evidence of acute or subacute infarct. 2. No hemodynamically significant stenosis in the neck. No evidence of dissection. Electronically Signed   By: Wiliam Ke M.D.   On: 03/29/2023 16:08    Procedures Procedures    Medications Ordered in ED Medications  dexamethasone (DECADRON) injection 10 mg (has no administration in time range)  gadobutrol (GADAVIST) 1 MMOL/ML injection 10 mL (10 mLs Intravenous Contrast Given 03/29/23 1416)  prochlorperazine (COMPAZINE) injection 10 mg (10 mg Intravenous Given 03/29/23 1506)  ketorolac (TORADOL) 30 MG/ML injection 30 mg (30 mg Intravenous Given 03/29/23 1506)  ondansetron (ZOFRAN) injection 4 mg (4 mg Intravenous Given 03/29/23 1507)  meclizine (ANTIVERT) tablet 25 mg (25 mg Oral Given 03/29/23 1507)    ED Course/ Medical Decision Making/ A&P                                 Medical Decision Making Amount and/or Complexity of Data Reviewed Labs: ordered. Radiology: ordered.  Risk Prescription drug management. Decision regarding hospitalization.   Patient is here with left neck pain, vertigo, posterior headache, and L facial paresthesia.  She has been having persistent vertigo issues for 1-1/2 weeks, and her numbness was noted when she woke up this morning.  She is outside the window for stroke activation.  I am concerned however about a possible posterior circulatory issue, including vertebral artery injury, given this presentation.  The patient has iodine allergies, but has tolerated MRIs  in the past.  I think would be reasonable to proceed with MRI of the brain as well as MRA of the neck to evaluate for posterior stroke or vascular injury.  I reviewed the patient's MR imaging.  There were no emergent findings.  Specific no evidence of vascular injury or high-grade stenosis or stroke on the brain.  The patient was reassessed after the IV migraine medications were  given, and says she had improvement and resolution of her headache, and improvement of her paresthesias as well.  I wonder whether this may be complex migraine?  However she continues having pain in the back of her neck.  Most likely suspect this is cervical neck pain at this point as she has had spinal surgery and problems of her cervical spine in the past.  She does not have red flags of spinal cord compression at this time but I would recommend you follow-up with a neurosurgeon.  We discussed the pain control regimen.  Unfortunately due to her intolerances and allergies, we have limited options.  We can try some steroids, IV Decadron and prednisone at home as a short burst.  She already takes NSAIDs.  She takes muscle relaxers.  She takes tramadol, and is otherwise intolerant to other opioids.  She is not interested in taking gabapentin.  Vertigo is improved and I suspect mostly peripheral vertigo.  She has meclizine at home.        Final Clinical Impression(s) / ED Diagnoses Final diagnoses:  Paresthesia  Neck pain  Nonintractable headache, unspecified chronicity pattern, unspecified headache type  Vertigo    Rx / DC Orders ED Discharge Orders          Ordered    predniSONE (DELTASONE) 10 MG tablet  Daily with breakfast        03/29/23 1639              Terald Sleeper, MD 03/29/23 1640

## 2023-03-29 NOTE — Telephone Encounter (Signed)
Called patient back. Informed that Dr. Laural Benes seeing patients.  She c/o neck tightness x 4 weeks and more recently having pain radiate behind her neck and to her elbow. Has taken multiple flexeril, Tramadol, and meclizine. She states pain is worse HS and it is hard to sleep.  She was informed no appt availability with Dr. Laural Benes and  also informed of the [facility] location today.   She states she will go to the ED.   Will send message to PCP as a FYI.

## 2023-03-29 NOTE — ED Triage Notes (Addendum)
Patient has had vertigo for 1 week. Complaining of neck pain for 3 weeks. Left sided facial pain in the neck, shoulder and numbness that began today at 7:30am. Complaining of a headache. Left face, left arm and left leg have different sensation than the right. Dr. Renaye Rakers called to triage.

## 2023-03-29 NOTE — Telephone Encounter (Signed)
  Chief Complaint: left neck pain severe, headache left side face feels "funny" vertigo worse Symptoms: vertigo worsening has to hold wall to walk. Worse laying down. Feels like passing out if turns head to fast. Headache, left side. Blurred vision. Left arm N/T s/p neck pain . Reports "feels like something, water running down my spine from back of head down". Left ear clogged. Pain left upper and lower jaw area at times. Lymph nodes swollen. Meclizine helps but sx return. Hx of neck surgery and has fallen x 3 since October . Hit head on 1 occasion.   Frequency: worsening since Wednesday  Pertinent Negatives: Patient denies difficulty breathing no chest pain reported Disposition: [x] ED /[] Urgent Care (no appt availability in office) / [] Appointment(In office/virtual)/ []  [facility]/ [] Home Care/ [] Refused Recommended Disposition /[] [facility]/ []  Follow-up with PCP Additional Notes:   Recommended ED due to headache blurred vision worsening vertigo. Please advise . Patient would like call back from PCP due to she is aware of her hx.       Reason for Disposition  Patient sounds very sick or weak to the triager  Answer Assessment - Initial Assessment Questions 1. DESCRIPTION: "Describe your dizziness."     Vertigo worsening  2. VERTIGO: "Do you feel like either you or the room is spinning or tilting?"      Room spinning  3. LIGHTHEADED: "Do you feel lightheaded?" (e.g., somewhat faint, woozy, weak upon standing)     If turns to quickly going to pass out  4. SEVERITY: "How bad is it?"  "Can you walk?"   - MILD: Feels slightly dizzy and unsteady, but is walking normally.   - MODERATE: Feels unsteady when walking, but not falling; interferes with normal activities (e.g., school, work).   - SEVERE: Unable to walk without falling, or requires assistance to walk without falling.     Severe room spinning laying down not able to drive affecting normal activities  5. ONSET:   "When did the dizziness begin?"     Last Wednesday but worsening now  6. AGGRAVATING FACTORS: "Does anything make it worse?" (e.g., standing, change in head position)     Movement, loud noises, darkness 7. CAUSE: "What do you think is causing the dizziness?"     Not sure  8. RECURRENT SYMPTOM: "Have you had dizziness before?" If Yes, ask: "When was the last time?" "What happened that time?"     Yes has been treated with meclizine  9. OTHER SYMPTOMS: "Do you have any other symptoms?" (e.g., headache, weakness, numbness, vomiting, earache)     Headache left side  , left side face "feels funny" , blurred vision , left arm N/T and left shoulder pain. Left ear clogged. Left neck pain lymph nodes swollen left neck. 10. PREGNANCY: "Is there any chance you are pregnant?" "When was your last menstrual period?"       na  Protocols used: Dizziness - Vertigo-A-AH

## 2023-04-27 DIAGNOSIS — M1991 Primary osteoarthritis, unspecified site: Secondary | ICD-10-CM | POA: Diagnosis not present

## 2023-04-27 DIAGNOSIS — M109 Gout, unspecified: Secondary | ICD-10-CM | POA: Diagnosis not present

## 2023-04-27 DIAGNOSIS — M2540 Effusion, unspecified joint: Secondary | ICD-10-CM | POA: Diagnosis not present

## 2023-04-27 DIAGNOSIS — M2560 Stiffness of unspecified joint, not elsewhere classified: Secondary | ICD-10-CM | POA: Diagnosis not present

## 2023-04-27 DIAGNOSIS — M0609 Rheumatoid arthritis without rheumatoid factor, multiple sites: Secondary | ICD-10-CM | POA: Diagnosis not present

## 2023-04-27 DIAGNOSIS — M3505 Sjogren syndrome with inflammatory arthritis: Secondary | ICD-10-CM | POA: Diagnosis not present

## 2023-04-29 ENCOUNTER — Other Ambulatory Visit: Payer: Self-pay | Admitting: Internal Medicine

## 2023-04-29 DIAGNOSIS — M542 Cervicalgia: Secondary | ICD-10-CM | POA: Diagnosis not present

## 2023-04-29 DIAGNOSIS — M4722 Other spondylosis with radiculopathy, cervical region: Secondary | ICD-10-CM | POA: Diagnosis not present

## 2023-04-29 DIAGNOSIS — Z8619 Personal history of other infectious and parasitic diseases: Secondary | ICD-10-CM

## 2023-05-02 ENCOUNTER — Encounter: Payer: Self-pay | Admitting: Internal Medicine

## 2023-05-02 ENCOUNTER — Ambulatory Visit: Payer: Medicare Other | Attending: Internal Medicine | Admitting: Internal Medicine

## 2023-05-02 ENCOUNTER — Other Ambulatory Visit: Payer: Self-pay

## 2023-05-02 VITALS — BP 112/72 | HR 66 | Temp 98.2°F | Ht 67.0 in | Wt 210.0 lb

## 2023-05-02 DIAGNOSIS — E1169 Type 2 diabetes mellitus with other specified complication: Secondary | ICD-10-CM | POA: Diagnosis not present

## 2023-05-02 DIAGNOSIS — J4520 Mild intermittent asthma, uncomplicated: Secondary | ICD-10-CM | POA: Diagnosis not present

## 2023-05-02 DIAGNOSIS — E785 Hyperlipidemia, unspecified: Secondary | ICD-10-CM

## 2023-05-02 DIAGNOSIS — M5412 Radiculopathy, cervical region: Secondary | ICD-10-CM | POA: Diagnosis not present

## 2023-05-02 DIAGNOSIS — I152 Hypertension secondary to endocrine disorders: Secondary | ICD-10-CM | POA: Diagnosis not present

## 2023-05-02 DIAGNOSIS — E1159 Type 2 diabetes mellitus with other circulatory complications: Secondary | ICD-10-CM

## 2023-05-02 DIAGNOSIS — K119 Disease of salivary gland, unspecified: Secondary | ICD-10-CM

## 2023-05-02 DIAGNOSIS — K219 Gastro-esophageal reflux disease without esophagitis: Secondary | ICD-10-CM | POA: Diagnosis not present

## 2023-05-02 DIAGNOSIS — E669 Obesity, unspecified: Secondary | ICD-10-CM

## 2023-05-02 DIAGNOSIS — Z8619 Personal history of other infectious and parasitic diseases: Secondary | ICD-10-CM

## 2023-05-02 DIAGNOSIS — R252 Cramp and spasm: Secondary | ICD-10-CM | POA: Diagnosis not present

## 2023-05-02 LAB — POCT GLYCOSYLATED HEMOGLOBIN (HGB A1C): HbA1c, POC (controlled diabetic range): 6.3 % (ref 0.0–7.0)

## 2023-05-02 LAB — GLUCOSE, POCT (MANUAL RESULT ENTRY): POC Glucose: 116 mg/dL — AB (ref 70–99)

## 2023-05-02 MED ORDER — VALACYCLOVIR HCL 500 MG PO TABS: 500.0000 mg | ORAL_TABLET | Freq: Every day | ORAL | 11 refills | Status: DC

## 2023-05-02 MED ORDER — MONTELUKAST SODIUM 10 MG PO TABS: 10.0000 mg | ORAL_TABLET | Freq: Every day | ORAL | 11 refills | Status: DC

## 2023-05-02 MED ORDER — RSVPREF3 VAC RECOMB ADJUVANTED 120 MCG/0.5ML IM SUSR
0.5000 mL | Freq: Once | INTRAMUSCULAR | 0 refills | Status: AC
  Filled 2023-05-02: qty 1, 1d supply, fill #0

## 2023-05-02 MED ORDER — ATORVASTATIN CALCIUM 10 MG PO TABS: ORAL_TABLET | ORAL | 2 refills | Status: DC

## 2023-05-02 MED ORDER — NEBIVOLOL HCL 5 MG PO TABS: 5.0000 mg | ORAL_TABLET | Freq: Every morning | ORAL | 3 refills | Status: AC

## 2023-05-02 MED ORDER — HYDROCHLOROTHIAZIDE 25 MG PO TABS
25.0000 mg | ORAL_TABLET | Freq: Two times a day (BID) | ORAL | 2 refills | Status: DC
Start: 2023-05-02 — End: 2024-02-03

## 2023-05-02 MED ORDER — DEXLANSOPRAZOLE 60 MG PO CPDR: 1.0000 | DELAYED_RELEASE_CAPSULE | Freq: Every morning | ORAL | 5 refills | Status: DC

## 2023-05-02 MED ORDER — CYCLOBENZAPRINE HCL 10 MG PO TABS
10.0000 mg | ORAL_TABLET | Freq: Three times a day (TID) | ORAL | 1 refills | Status: DC | PRN
Start: 2023-05-02 — End: 2023-10-04

## 2023-05-02 MED ORDER — FREESTYLE LIBRE 3 READER DEVI: 0 refills | Status: DC

## 2023-05-02 MED ORDER — FREESTYLE LIBRE 3 PLUS SENSOR MISC: 12 refills | Status: DC

## 2023-05-02 NOTE — Progress Notes (Signed)
Patient ID: [name], female    DOB: 1956-02-14  MRN: [id number]  CC: Diabetes (DM f/u. Med refill. Franchot Erichsen freestyle libre 2 - now discontinued may need alternate /Already received flu vax)   Subjective: [name] is a 68 y.o. female who presents for chronic ds management. Her concerns today include:  Patient with history of DM type II, HTN, HL, Sjogren syndrome, Raynaud's phenomenon, OA knees/ankles/shoulders/hips, HSV 2, asthma, gout, thyroid nodules followed by Dr. Gerrit Friends, genital herpes, GERD, vaginal wall prolapse, OAB.   DM: Results for orders placed or performed in visit on 05/02/23  POCT glycosylated hemoglobin (Hb A1C)   Collection Time: 05/02/23  2:08 PM  Result Value Ref Range   Hemoglobin A1C     HbA1c POC (<> result, manual entry)     HbA1c, POC (prediabetic range)     HbA1c, POC (controlled diabetic range) 6.3 0.0 - 7.0 %  POCT glucose (manual entry)   Collection Time: 05/02/23  2:08 PM  Result Value Ref Range   POC Glucose 116 (A) 70 - 99 mg/dl  Z3G increased from 5.9. Reports she loves sweet and gained.  Plans to get back on track.  Completed PT for RT ankle.  Balance still a little off with her ankle when she does a lot of walking.  Uses her cane when she has to do a lot of walking Request Libre 3. Libre 2 discontinued  Had episode of vertigo last mth.  Seen in ER. MRI/MRA head/neck negative.  She has been having some pain on the left side of her neck that radiates over the shoulder.  Burning sensation.  She is seen the neurosurgeon Dr. Lovell Sheehan who did her neck surgery and lower back surgery.  He ordered x-ray of the neck but told her that he most likely will be ordering an MRI of the neck as well   HTN: Reports compliance with HCTZ 25 mg twice a day and nebivolol 5 mg daily.  HL: Still taking atorvastatin 3 times a week.  Reports intermittent swelling and tenderness over the right submandibular salivary gland over the past month.  She has Sjogren's  syndrome.  She takes pilocarpine.  Request refills on her medications including the valacyclovir.  No recent herpes outbreak.  Needs refill on her inhalers for asthma, her PPI for GERD.  These disorders are stable. Patient Active Problem List   Diagnosis Date Noted   Dry mouth 06/24/2022   Joint swelling 06/24/2022   Other specified abnormal immunological findings in serum 06/24/2022   Pain in limb 06/24/2022   Insulin resistance 05/19/2022   Low mean corpuscular hemoglobin concentration (MCHC) 05/19/2022   Other fatigue 05/05/2022   SOBOE (shortness of breath on exertion) 05/05/2022   Right foot pain 05/05/2022   Depression screen 05/05/2022   Arthritis 03/23/2022   Hyperlipidemia associated with type 2 diabetes mellitus (HCC) 12/24/2021   Essential hypertension 10/21/2021   Type 2 diabetes mellitus with hypoglycemia without coma, without long-term current use of insulin (HCC) 10/21/2021   Primary osteoarthritis involving multiple joints 10/21/2021   Mild intermittent asthma without complication 10/21/2021   History of herpes genitalis 10/21/2021   History of gout 10/21/2021   Gastroesophageal reflux disease without esophagitis 10/21/2021   Persistent cough 04/20/2021   Chronic vertigo 12/04/2020   COVID-19 04/16/2020   Neuropathic pain 04/12/2020   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   Gout 12/19/2019   Carotid artery stenosis 11/24/2019   Multiple joint pain 11/24/2019   Allergy  to food 05/05/2018   Non-toxic nodular goiter 08/31/2017   Reflux    Asthma    Sjogren syndrome with inflammatory arthritis (HCC)      Current Outpatient Medications on File Prior to Visit  Medication Sig Dispense Refill   acetaminophen (TYLENOL) 650 MG CR tablet Take 650 mg by mouth as needed.       albuterol (VENTOLIN HFA) 108 (90 Base) MCG/ACT inhaler Inhale 2 puffs into the lungs every 6 (six) hours as needed for wheezing or shortness of breath. 8 g 5   ascorbic acid (VITAMIN C) 500 MG  tablet Take 500 mg by mouth daily.     b complex vitamins capsule Take 1 capsule by mouth daily. 1 chewable once daily.     benzonatate (TESSALON) 200 MG capsule Take 1 capsule (200 mg total) by mouth 3 (three) times daily as needed for cough. 30 capsule 6   BIOTIN PO Take by mouth.     Calcium 500-2.5 MG-MCG CHEW Chew 2 tablets by mouth daily.     Cetirizine HCl (ZYRTEC PO) Take 10 mg by mouth.      Cholecalciferol (VITAMIN D PO) Take by mouth. TAKES 2000      Cinnamon 500 MG TABS Take 2 tablets by mouth daily.     cycloSPORINE (RESTASIS) 0.05 % ophthalmic emulsion 1 drop 2 (two) times daily.     diclofenac Sodium (VOLTAREN) 1 % GEL Apply 2 g topically daily. Once daily in the morning as needed.     EPINEPHrine 0.3 mg/0.3 mL IJ SOAJ injection Inject 0.3 mg into the muscle as needed for anaphylaxis. 1 each 1   ergocalciferol (VITAMIN D2) 1.25 MG (50000 UT) capsule Take 1 capsule (50,000 Units total) by mouth once a week. 12 capsule 1   folic acid (FOLVITE) 1 MG tablet Take 1 mg by mouth daily. 2 tablets daily     Glucose 15 g PACK Take by mouth. Chew up to 4 tablets prn.     glucose blood (FREESTYLE LITE) test strip Use as instructed 100 each 12   ipratropium-albuterol (DUONEB) 0.5-2.5 (3) MG/3ML SOLN Take 3 mLs by nebulization every 6 (six) hours as needed. 100 mL 3   Lancets (FREESTYLE) lancets Use as instructed 100 each 12   meclizine (ANTIVERT) 25 MG tablet Take 1 tablet (25 mg total) by mouth 2 (two) times daily as needed for dizziness. 30 tablet 1   meloxicam (MOBIC) 15 MG tablet Take 1 tablet (15 mg total) by mouth daily. 30 tablet 5   methotrexate (RHEUMATREX) 2.5 MG tablet Take 2.5 mg by mouth once a week. 8 pills once a week     Multiple Vitamins-Iron (MULTIVITAMIN/IRON PO) Take by mouth.       polyethylene glycol (MIRALAX / GLYCOLAX) 17 g packet Take 17 g by mouth daily.     triamcinolone (NASACORT) 55 MCG/ACT nasal inhaler Place 2 sprays into the nose as needed.       vitamin E 180  MG (400 UNITS) capsule Take 400 Units by mouth in the morning and at bedtime.     No current facility-administered medications on file prior to visit.    Allergies  Allergen Reactions   Codeine Swelling   Duratuss [Phenylephrine-Guaifenesin] Hives   Erythromycin Swelling and Hives    ALLERGIC TO MYCINS  ALLERGIC TO MYCINS ALLERGIC TO MYCINS    ALLERGIC TO MYCINS   Hydrocodone-Acetaminophen Swelling   Iodine Swelling   Latex Swelling   Oxycodone Swelling and Other (See Comments)  Peanuts [Nuts] Swelling   Penicillins Swelling and Other (See Comments)   Shellfish Allergy Swelling   Vicodin [Hydrocodone-Acetaminophen] Swelling   Augmentin [Amoxicillin-Pot Clavulanate]    Phentermine     Other Reaction(s): Not available    Social History   Socioeconomic History   Marital status: Single    Spouse name: Not on file   Number of children: Not on file   Years of education: Not on file   Highest education level: Bachelor's degree (e.g., BA, AB, BS)  Occupational History   Not on file  Tobacco Use   Smoking status: Former   Smokeless tobacco: Never  Vaping Use   Vaping status: Never Used  Substance and Sexual Activity   Alcohol use: Yes    Comment: Rare   Drug use: No   Sexual activity: Not Currently    Birth control/protection: Post-menopausal  Other Topics Concern   Not on file  Social History Narrative   Not on file   Social Drivers of Health   Financial Resource Strain: Low Risk  (04/29/2023)   Overall Financial Resource Strain (CARDIA)    Difficulty of Paying Living Expenses: Not hard at all  Food Insecurity: No Food Insecurity (04/29/2023)   Hunger Vital Sign    Worried About Running Out of Food in the Last Year: Never true    Ran Out of Food in the Last Year: Never true  Transportation Needs: No Transportation Needs (04/29/2023)   PRAPARE - Administrator, Civil Service (Medical): No    Lack of Transportation (Non-Medical): No  Physical  Activity: Inactive (04/29/2023)   Exercise Vital Sign    Days of Exercise per Week: 0 days    Minutes of Exercise per Session: 0 min  Stress: Stress Concern Present (04/29/2023)   [employer] of Occupational Health - Occupational Stress Questionnaire    Feeling of Stress : To some extent  Social Connections: Moderately Integrated (04/29/2023)   Social Connection and Isolation Panel [NHANES]    Frequency of Communication with Friends and Family: More than three times a week    Frequency of Social Gatherings with Friends and Family: More than three times a week    Attends Religious Services: More than 4 times per year    Active Member of [employer] or Organizations: Yes    Attends Banker Meetings: More than 4 times per year    Marital Status: Divorced  Intimate Partner Violence: Not At Risk (11/16/2022)   Humiliation, Afraid, Rape, and Kick questionnaire    Fear of Current or Ex-Partner: No    Emotionally Abused: No    Physically Abused: No    Sexually Abused: No    Family History  Problem Relation Age of Onset   Hypertension Mother    Heart disease Mother    Arthritis Mother    Hyperlipidemia Mother    Stroke Mother    Heart disease Father    Hypertension Father    Cancer Father        Prostate and lung   Alcohol abuse Father    Anxiety disorder Father    Cancer Paternal Grandmother        COLON CA   Breast cancer Maternal Aunt        LATE 50'S   Ovarian cancer Maternal Aunt     Past Surgical History:  Procedure Laterality Date   APPENDECTOMY     BACK SURGERY  03/12/2010   L5   BREAST SURGERY  BIOPSY   HERNIA REPAIR     groin bilateral   NECK SURGERY     PELVIC LAPAROSCOPY  7425,9563   PILONIDAL CYST EXCISION     SALPINGOOPHORECTOMY  10/11/2002   SCOPE LSO-LEFT SEROUS CYSTADENOMA   SALPINGOOPHORECTOMY  04/12/2005   RSO/TOA   SHOULDER SURGERY Right 01/2018   TONSILLECTOMY AND ADENOIDECTOMY     TUBAL LIGATION     VAGINAL HYSTERECTOMY  01/10/2005    TVH, POSTERIOR REPAIR    ROS: Review of Systems Negative except as stated above  PHYSICAL EXAM: BP 112/72 (BP Location: Right Arm, Patient Position: Sitting, Cuff Size: Normal)   Pulse 66   Temp 98.2 F (36.8 C) (Oral)   Ht 5\' 7"  (1.702 m)   Wt 210 lb (95.3 kg)   SpO2 100%   BMI 32.89 kg/m   Wt Readings from Last 3 Encounters:  05/02/23 210 lb (95.3 kg)  03/29/23 209 lb (94.8 kg)  12/31/22 210 lb (95.3 kg)    Physical Exam  General appearance - alert, well appearing, and in no distress Mental status - normal mood, behavior, speech, dress, motor activity, and thought processes Neck - supple, no significant adenopathy Chest - clear to auscultation, no wheezes, rales or rhonchi, symmetric air entry Heart - normal rate, regular rhythm, normal S1, S2, no murmurs, rubs, clicks or gallops Extremities - peripheral pulses normal, no pedal edema, no clubbing or cyanosis Diabetic Foot Exam - Simple   Simple Foot Form Visual Inspection See comments: Yes Sensation Testing See comments: Yes Pulse Check Posterior Tibialis and Dorsalis pulse intact bilaterally: Yes Comments Patient is flat-footed right greater than left.  She has corns on the second through the fourth toes.  Noninflamed bunion on the left big toe.  Mild decreased sensation on leap exam on the right heel.         Latest Ref Rng & Units 03/29/2023   12:09 PM 05/05/2022   10:08 AM 02/11/2022   10:43 AM  CMP  Glucose 70 - 99 mg/dL 875  90    BUN 8 - 23 mg/dL 26  24    Creatinine 6.43 - 1.00 mg/dL 3.29  5.18    Sodium 841 - 145 mmol/L 137  142    Potassium 3.5 - 5.1 mmol/L 3.6  4.1    Chloride 98 - 111 mmol/L 100  101    CO2 22 - 32 mmol/L 29  27    Calcium 8.9 - 10.3 mg/dL 9.2  9.2    Total Protein 6.0 - 8.5 g/dL  6.8  6.9   Total Bilirubin 0.0 - 1.2 mg/dL  0.3  0.4   Alkaline Phos 44 - 121 IU/L  115  114   AST 0 - 40 IU/L  24  27   ALT 0 - 32 IU/L  29  24    Lipid Panel     Component Value Date/Time    CHOL 156 01/11/2022 1048   TRIG 95 01/11/2022 1048   HDL 51 01/11/2022 1048   CHOLHDL 3.1 01/11/2022 1048   LDLCALC 87 01/11/2022 1048    CBC    Component Value Date/Time   WBC 4.4 03/29/2023 1209   RBC 4.79 03/29/2023 1209   HGB 13.2 03/29/2023 1209   HGB 13.0 05/05/2022 1008   HCT 41.0 03/29/2023 1209   HCT 41.7 05/05/2022 1008   PLT 245 03/29/2023 1209   PLT 188 05/05/2022 1008   MCV 85.6 03/29/2023 1209   MCV 81 05/05/2022 1008  MCH 27.6 03/29/2023 1209   MCHC 32.2 03/29/2023 1209   RDW 14.8 03/29/2023 1209   RDW 13.2 05/05/2022 1008   LYMPHSABS 1.4 05/05/2022 1008   MONOABS 0.4 03/17/2009 1053   EOSABS 0.1 05/05/2022 1008   BASOSABS 0.0 05/05/2022 1008    ASSESSMENT AND PLAN: 1. Type 2 diabetes mellitus with obesity (HCC) (Primary) At goal.  Encouraged her to get back on track with her eating habits.  She would like to start exercising but will hold off for now until the neck issue is resolved.  Prescription sent for the updated freestyle libre 3+ - POCT glycosylated hemoglobin (Hb A1C) - POCT glucose (manual entry) - Microalbumin / creatinine urine ratio - Continuous Glucose Sensor (FREESTYLE LIBRE 3 PLUS SENSOR) MISC; Change sensor every 15 days.  Dispense: 2 each; Refill: 12 - Continuous Glucose Receiver (FREESTYLE LIBRE 3 READER) DEVI; UAD  Dispense: 1 each; Refill: 0 - Ambulatory referral to Ophthalmology  2. Hypertension associated with diabetes (HCC) At goal.  Continue nebivolol and HCTZ - hydrochlorothiazide (HYDRODIURIL) 25 MG tablet; Take 1 tablet (25 mg total) by mouth 2 (two) times daily.  Dispense: 180 tablet; Refill: 2 - nebivolol (BYSTOLIC) 5 MG tablet; Take 1 tablet (5 mg total) by mouth every morning.  Dispense: 90 tablet; Refill: 3  3. Hyperlipidemia associated with type 2 diabetes mellitus (HCC) Continue atorvastatin - atorvastatin (LIPITOR) 10 MG tablet; 1 tab PO Q Mon/Wed/Frid  Dispense: 36 tablet; Refill: 2  4. 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  However I recommend referral to ENT to evaluate further.  Patient declines stating she had seen them in the past and was told the same thing that it is due to the Sjogren's.  Encouraged her to drink adequate fluids and to suck on sugar-free candy intermittently  5. Cervical radiculopathy Being evaluated by her neurosurgery  6. Muscle cramp Refill given on Flexeril - cyclobenzaprine (FLEXERIL) 10 MG tablet; Take 1 tablet (10 mg total) by mouth 3 (three) times daily as needed.  Dispense: 90 tablet; Refill: 1  7. History of herpes genitalis - valACYclovir (VALTREX) 500 MG tablet; Take 1 tablet (500 mg total) by mouth daily.  Dispense: 30 tablet; Refill: 11  8. Mild intermittent asthma without complication - montelukast (SINGULAIR) 10 MG tablet; Take 1 tablet (10 mg total) by mouth at bedtime.  Dispense: 30 tablet; Refill: 11  9. Gastroesophageal reflux disease without esophagitis - dexlansoprazole (DEXILANT) 60 MG capsule; Take 1 capsule (60 mg total) by mouth every morning.  Dispense: 30 capsule; Refill: 5              Patient was given the opportunity to ask questions.  Patient verbalized understanding of the plan and was able to repeat key elements of the plan.   This documentation was completed using Paediatric nurse.  Any transcriptional errors are unintentional.  Orders Placed This Encounter  Procedures   Microalbumin / creatinine urine ratio   Ambulatory referral to Ophthalmology   POCT glycosylated hemoglobin (Hb A1C)   POCT glucose (manual entry)     Requested Prescriptions   Signed Prescriptions Disp Refills   atorvastatin (LIPITOR) 10 MG tablet 36 tablet 2    Sig: 1 tab PO Q Mon/Wed/Frid   cyclobenzaprine (FLEXERIL) 10 MG tablet 90 tablet 1    Sig: Take 1 tablet (10 mg total) by mouth 3 (three) times daily as needed.   hydrochlorothiazide (HYDRODIURIL) 25 MG tablet 180 tablet 2    Sig:  Take 1  tablet (25 mg total) by mouth 2 (two) times daily.   Continuous Glucose Sensor (FREESTYLE LIBRE 3 PLUS SENSOR) MISC 2 each 12    Sig: Change sensor every 15 days.   Continuous Glucose Receiver (FREESTYLE LIBRE 3 READER) DEVI 1 each 0    Sig: UAD   valACYclovir (VALTREX) 500 MG tablet 30 tablet 11    Sig: Take 1 tablet (500 mg total) by mouth daily.   nebivolol (BYSTOLIC) 5 MG tablet 90 tablet 3    Sig: Take 1 tablet (5 mg total) by mouth every morning.   montelukast (SINGULAIR) 10 MG tablet 30 tablet 11    Sig: Take 1 tablet (10 mg total) by mouth at bedtime.   dexlansoprazole (DEXILANT) 60 MG capsule 30 capsule 5    Sig: Take 1 capsule (60 mg total) by mouth every morning.    Return in about 4 months (around 08/30/2023).  Jonah Blue, MD, FACP

## 2023-05-03 LAB — MICROALBUMIN / CREATININE URINE RATIO
Creatinine, Urine: 205.7 mg/dL
Microalb/Creat Ratio: 5 mg/g{creat} (ref 0–29)
Microalbumin, Urine: 10 ug/mL

## 2023-05-04 ENCOUNTER — Telehealth: Payer: Self-pay | Admitting: Internal Medicine

## 2023-05-04 ENCOUNTER — Encounter: Payer: Self-pay | Admitting: Internal Medicine

## 2023-05-04 NOTE — Telephone Encounter (Signed)
Copied from CRM [id number]. Topic: General - Other >> May 03, 2023  4:07 PM Clide Dales wrote: Jetta Lout  called to let provider know that patient had a quantaflow done today that showed Moderate right pad [phone]

## 2023-05-04 NOTE — Telephone Encounter (Signed)
Please find out if caller is NP or RN from her insurance who did a home visit and did this as a screening test or done by her podiatrist.

## 2023-05-06 NOTE — Telephone Encounter (Signed)
Spoke with Jetta Lout, NP This was a screening via  [employer], organization. Patient was instructed to bring forms from the screening to PCP at next appt and the organization will also be sending them.

## 2023-05-06 NOTE — Telephone Encounter (Signed)
Called Catherine Lout, NP back at [phone].   Left message on voicemail to return call with the answer to this information:    Asked on voicemail if the quantaflow test was done as a home visit or by the Podiatrist.

## 2023-05-09 ENCOUNTER — Other Ambulatory Visit: Payer: Self-pay | Admitting: Neurosurgery

## 2023-05-09 DIAGNOSIS — M4722 Other spondylosis with radiculopathy, cervical region: Secondary | ICD-10-CM

## 2023-05-11 ENCOUNTER — Other Ambulatory Visit: Payer: Self-pay | Admitting: Internal Medicine

## 2023-05-11 DIAGNOSIS — J4520 Mild intermittent asthma, uncomplicated: Secondary | ICD-10-CM

## 2023-05-22 ENCOUNTER — Ambulatory Visit
Admission: RE | Admit: 2023-05-22 | Discharge: 2023-05-22 | Disposition: A | Discharge status: Home / Self Care | Payer: Medicare Other | Source: Ambulatory Visit | Attending: Neurosurgery | Admitting: Neurosurgery

## 2023-05-22 DIAGNOSIS — M5412 Radiculopathy, cervical region: Secondary | ICD-10-CM | POA: Diagnosis not present

## 2023-05-22 DIAGNOSIS — M4722 Other spondylosis with radiculopathy, cervical region: Secondary | ICD-10-CM

## 2023-05-24 DIAGNOSIS — Z4889 Encounter for other specified surgical aftercare: Secondary | ICD-10-CM | POA: Diagnosis not present

## 2023-05-24 DIAGNOSIS — M79671 Pain in right foot: Secondary | ICD-10-CM | POA: Diagnosis not present

## 2023-05-24 DIAGNOSIS — M76829 Posterior tibial tendinitis, unspecified leg: Secondary | ICD-10-CM | POA: Diagnosis not present

## 2023-05-26 DIAGNOSIS — M0609 Rheumatoid arthritis without rheumatoid factor, multiple sites: Secondary | ICD-10-CM | POA: Diagnosis not present

## 2023-05-31 ENCOUNTER — Other Ambulatory Visit: Payer: Self-pay | Admitting: Internal Medicine

## 2023-05-31 DIAGNOSIS — E559 Vitamin D deficiency, unspecified: Secondary | ICD-10-CM

## 2023-06-01 ENCOUNTER — Telehealth: Payer: Self-pay | Admitting: Internal Medicine

## 2023-06-01 DIAGNOSIS — E559 Vitamin D deficiency, unspecified: Secondary | ICD-10-CM

## 2023-06-01 NOTE — Telephone Encounter (Signed)
 Let patient know that I received request for refill on once weekly high-dose vitamin D.  Was she taking this consistently? I would like for her to come to lab for [place] to recheck Vitamin D level.

## 2023-06-01 NOTE — Telephone Encounter (Signed)
 Called & spoke tot he patient. Verified name & DOB. Patient confirmed that she had been consistently taking her vitamin D vitamin. Informed patient that PCP would like to check Vit D level before sending a refill. Lab appointment scheduled for 06/06/2023.

## 2023-06-06 ENCOUNTER — Ambulatory Visit: Payer: Medicare Other | Attending: Internal Medicine

## 2023-06-06 DIAGNOSIS — E559 Vitamin D deficiency, unspecified: Secondary | ICD-10-CM | POA: Diagnosis not present

## 2023-06-07 LAB — VITAMIN D 25 HYDROXY (VIT D DEFICIENCY, FRACTURES): Vit D, 25-Hydroxy: 71 ng/mL (ref 30.0–100.0)

## 2023-06-08 ENCOUNTER — Ambulatory Visit: Payer: Self-pay | Admitting: Internal Medicine

## 2023-06-08 ENCOUNTER — Telehealth: Payer: Medicare Other | Admitting: Family Medicine

## 2023-06-08 ENCOUNTER — Encounter: Payer: Self-pay | Admitting: Family Medicine

## 2023-06-08 VITALS — BP 108/82 | HR 97 | Temp 100.5°F | Ht 67.0 in | Wt 212.0 lb

## 2023-06-08 DIAGNOSIS — J4520 Mild intermittent asthma, uncomplicated: Secondary | ICD-10-CM

## 2023-06-08 DIAGNOSIS — J069 Acute upper respiratory infection, unspecified: Secondary | ICD-10-CM | POA: Diagnosis not present

## 2023-06-08 NOTE — Addendum Note (Signed)
 Addended by: Ronnald Nian on: 06/08/2023 04:11 PM   Modules accepted: Level of Service

## 2023-06-08 NOTE — Telephone Encounter (Signed)
 Noted.

## 2023-06-08 NOTE — Progress Notes (Signed)
   Subjective:    Patient ID: [name], female    DOB: Dec 10, 1955, 68 y.o.   MRN: [id number]  HPI Documentation for virtual audio and video telecommunications through Caregility encounter: The patient was located at home. 2 patient identifiers used.  The provider was located in the office. The patient did consent to this visit and is aware of possible charges through their insurance for this visit. The other persons participating in this telemedicine service were none. Time spent on call was 5 minutes and in review of previous records >15 minutes total for counseling and coordination of care. This virtual service is not related to other E/M service within previous 7 days.  She states that Sunday she developed a sore throat with coughing, malaise, slight fever, nasal congestion.  This has continued and she has also been using the nebulizer that she uses when she has allergy symptoms that usually occur with change of seasons.  She has also been using Tessalon and Tylenol.  Review of Systems     Objective:    Physical Exam Alert and in no distress but does cough occasionally.       Assessment & Plan:  URI with cough and congestion  Mild intermittent asthma without complication I explained that I thought that she had a viral upper respiratory infection with underlying asthma and be more aggressive with using the nebulizer since it did help with the coughing.  Also Tylenol for the aches and pains.  If she continues to have difficulty she should call for recheck.

## 2023-06-08 NOTE — Telephone Encounter (Signed)
  Chief Complaint: influenza exposure, symptomatic Symptoms: left earache, sore throat, productive cough, body aches, chest congestion, headache, chills Frequency: x 3 days Pertinent Negatives: Patient denies fever. Disposition: [] ED /[] Urgent Care (no appt availability in office) / [x] Appointment(In office/virtual)/ []  [facility]/ [] Home Care/ [] Refused Recommended Disposition /[]  Mobile Bus/ []  Follow-up with PCP Additional Notes: Patient states she has been taking the tessalon perles and Tylenol 650mg  twice daily. Patient states she was not able to do a home COVID or flu test because they are expired. Patient states she is going to use her inhaler after triage call. She would like to be seen today due to she feels like if she waits any longer her asthma will get worse and she will lose her voice.  Copied From CRM [id number]. Reason for Triage: patient has called in to try and see any provider available, she stated that Monday she started with coughing and bodyaches, and now the congestion in the chest and she doesn't want it to get worse because she has asthma   Reason for Disposition  Earache  Answer Assessment - Initial Assessment Questions 1. WORST SYMPTOM: "What is your worst symptom?" (e.g., cough, runny nose, muscle aches, headache, sore throat, fever)      Headache and chest congestion with cough.  2. ONSET: "When did your flu symptoms start?"      Monday afternoon, felt bad and by the evening sore throat, cough, runny nose and headache started.  3. COUGH: "How bad is the cough?"       "Coughing myself to death, it's giving me a headache".  4. RESPIRATORY DISTRESS: "Describe your breathing."      "Tight in my chest, heard a little wheeze earlier and I'm gonna use my inhaler".  5. FEVER: "Do you have a fever?" If Yes, ask: "What is your temperature, how was it measured, and when did it start?"     99.9 was the highest temperature.  6. 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 States she was at a conference Thursday and Friday and could have been exposed to something.  7. FLU VACCINE: "Did you get a flu shot this year?"     Yes.  8. HIGH RISK DISEASE: "Do you have any chronic medical problems?" (e.g., heart or lung disease, asthma, weak immune system, or other HIGH RISK conditions)     Asthma.  9. OTHER SYMPTOMS: "Do you have any other symptoms?"  (e.g., runny nose, muscle aches, headache, sore throat)       Cough with brown/yellow/bloody mucus, body aches, chills, headache, sore throat.  Protocols used: Influenza (Flu) - [facility]

## 2023-06-13 ENCOUNTER — Ambulatory Visit: Payer: Self-pay | Admitting: Internal Medicine

## 2023-06-13 ENCOUNTER — Other Ambulatory Visit: Payer: Self-pay

## 2023-06-13 ENCOUNTER — Ambulatory Visit
Admission: RE | Admit: 2023-06-13 | Discharge: 2023-06-13 | Disposition: A | Discharge status: Home / Self Care | Source: Ambulatory Visit | Attending: Family Medicine | Admitting: Family Medicine

## 2023-06-13 VITALS — BP 114/67 | HR 78 | Temp 98.5°F | Resp 20

## 2023-06-13 DIAGNOSIS — U071 COVID-19: Secondary | ICD-10-CM

## 2023-06-13 DIAGNOSIS — J45901 Unspecified asthma with (acute) exacerbation: Secondary | ICD-10-CM | POA: Diagnosis not present

## 2023-06-13 LAB — POCT INFLUENZA A/B
Influenza A, POC: NEGATIVE
Influenza B, POC: NEGATIVE

## 2023-06-13 MED ORDER — PREDNISONE 20 MG PO TABS: 40.0000 mg | ORAL_TABLET | Freq: Every day | ORAL | 0 refills | Status: AC

## 2023-06-13 MED ORDER — ALBUTEROL SULFATE HFA 108 (90 BASE) MCG/ACT IN AERS: 2.0000 | INHALATION_SPRAY | Freq: Four times a day (QID) | RESPIRATORY_TRACT | 1 refills | Status: AC | PRN

## 2023-06-13 MED ORDER — PROMETHAZINE-DM 6.25-15 MG/5ML PO SYRP: 5.0000 mL | ORAL_SOLUTION | Freq: Four times a day (QID) | ORAL | 0 refills | Status: AC | PRN

## 2023-06-13 NOTE — Telephone Encounter (Signed)
 Chief Complaint: covid Symptoms: sore throat, headache, productive cough, chest/head/ear congestion Frequency: constant   Disposition: [] ED /[x] Urgent Care (no appt availability in office) / [] Appointment(In office/virtual)/ []  [facility]/ [] Home Care/ [] Refused Recommended Disposition /[] [facility]/ []  Follow-up with PCP Additional Notes: Pt complaining of head/chest/ear congestion related to Covid Dx  on Friday. Pt started with symptoms on 2/22. Pt had tele-doc appt but wasn't prescribed any medication and was told to see PCP if not better through weekend. Pt states she is having to use nebulizer to help break mucus up in chest but has run out of medicaiton. Pt states she has asthma and SOB is normal for her, but this level of chest tightness is not. Pt states she has productive cough that is greenish-brown and has metal taste. Pt denies fever.  Per protocol, pt to be seen today. No PCP appt and pt didn't want to go to another office. RN advised pt to go to urgent care. RN gave care advice and pt verbalized understanding.            Copied from CRM [id number]. Topic: Clinical - Red Word Triage >> Jun 13, 2023  9:01 AM Franchot Heidelberg wrote: Red Word that prompted transfer to Nurse Triage: Copied From CRM [phone]. Reason for Triage: Pt called reporting that she is not feeling better, she has tested positive for Covid. She needs medicine, she had a virtual visit on Friday but was not prescribed anything.   She has sore throat, aches, pain, chest feels like it is on fire, congestion, has asthma.   Best contact: [phone] Reason for Disposition  [1] PERSISTING SYMPTOMS OF COVID-19 AND [2] symptoms WORSE  Answer Assessment - Initial Assessment Questions 1. COVID-19 ONSET: "When did the symptoms of COVID-19 first start?"     2/22 2. DIAGNOSIS CONFIRMATION: "How were you diagnosed?" (e.g., COVID-19 oral or nasal viral test; COVID-19 antibody test; doctor visit)      2/28 3. MAIN SYMPTOM:  "What is your main concern or symptom right now?" (e.g., breathing difficulty, cough, fatigue. loss of smell)     Congestion in head/ears/chest 4. SYMPTOM ONSET: "When did the    start?"     2/22 5. BETTER-SAME-WORSE: "Are you getting better, staying the same, or getting worse over the last 1 to 2 weeks?"     Some better but tightness and ache  6. RECENT MEDICAL VISIT: "Have you been seen by a healthcare provider (doctor, NP, PA) for these persisting COVID-19 symptoms?" If Yes, ask: "When were you seen?" (e.g., date)     Tele-visit  7. COUGH: "Do you have a cough?" If Yes, ask: "How bad is the cough?"       Yes brownish/green  8. FEVER: "Do you have a fever?" If Yes, ask: "What is your temperature, how was it measured, and when did it start?"     no 9. BREATHING DIFFICULTY: "Are you having any trouble breathing?" If Yes, ask: "How bad is your breathing?" (e.g., mild, moderate, severe)    - MILD: No SOB at rest, mild SOB with walking, speaks normally in sentences, can lie down, no retractions, pulse < 100.    - MODERATE: SOB at rest, SOB with minimal exertion and prefers to sit, cannot lie down flat, speaks in phrases, mild retractions, audible wheezing, pulse 100-120.    - SEVERE: Very SOB at rest, speaks in single words, struggling to breathe, sitting hunched forward, retractions, pulse > 120.  Moderate due to asthma  10. OTHER SYMPTOMS: "Do you have any other symptoms?"  (e.g., fatigue, headache, muscle pain, weakness)       Congestion; headache, sore throat, cough 11. HIGH RISK DISEASE: "Do you have any chronic medical problems?" (e.g., asthma, heart or lung disease, weak immune system, obesity, etc.)       Asthma  12. VACCINE: "Have you gotten the COVID-19 vaccine?" If Yes, ask: "Which one, how many shots, when did you get it?"       Pfizer-2 shots   14. O2 SATURATION MONITOR:  "Do you use an oxygen saturation monitor (pulse oximeter) at home?" If Yes, ask  "What is your reading (oxygen level) today?" "What is your usual oxygen saturation reading?" (e.g., 95%)       na  Protocols used: Coronavirus (COVID-19) Persisting Symptoms Follow-up Call-[facility]

## 2023-06-13 NOTE — ED Triage Notes (Addendum)
 Pt was triaged by Marylene Land PA at this time. Pt presents to urgent care after testing COVID+ at home on 2/21. Hx of asthma. Pt is currently reporting SOB, sore throat, headaches, head/chest congestion, fatigue, and generalized body aches. Requesting rapid flu test.

## 2023-06-13 NOTE — Telephone Encounter (Signed)
 Patient has appointment scheduled with PCP for tomorrow. Patient has also been seen at urgent care today for symptoms.

## 2023-06-13 NOTE — ED Provider Notes (Signed)
 [name]    CSN: [id number] Arrival date & time: 06/13/23  1431      History   Chief Complaint Chief Complaint  Patient presents with   Influenza    I tested positive for Covid and I have asthma. Shortness of breath, headache, body aches and congestion in my head and chest. - Entered by patient    HPI [name] is a 68 y.o. female.    Influenza Presenting symptoms: cough, fatigue, fever, headache, rhinorrhea, shortness of breath and sore throat   Presenting symptoms: no diarrhea, no nausea and no vomiting   Associated symptoms: chills, ear pain and nasal congestion   Has been sick for 9 days after attending a conference in [place].  Symptoms include cough, body aches, fatigue, had fever for several days, admits sore throat, nasal congestion and rhinorrhea.  Has a history of asthma has been using her nebulizer machine regularly, admits intermittent wheezing.  She called her doctor who told her she had the flu.  Her symptoms persisted so she took a home COVID test which was positive. Admits positive sputum green to brown, chest burns when she coughs and she has shortness of breath and wheezing. Denies nausea vomiting diarrhea. 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   HTN (hypertension)    Joint pain    Lactose intolerance    Multiple food allergies    OA (osteoarthritis)    Reflux    Sjogren's syndrome (HCC)    SOB (shortness of breath)    Swallowing difficulty    Thyroid condition    Vitamin D deficiency     Patient Active Problem List   Diagnosis Date Noted   Dry mouth 06/24/2022   Joint swelling 06/24/2022   Other specified abnormal immunological findings in serum 06/24/2022   Pain in limb 06/24/2022   Insulin  resistance 05/19/2022   Low mean corpuscular hemoglobin concentration (MCHC) 05/19/2022   Other fatigue 05/05/2022   SOBOE (shortness of breath on exertion) 05/05/2022   Right foot pain 05/05/2022   Depression screen 05/05/2022   Arthritis 03/23/2022   Hyperlipidemia associated with type 2 diabetes mellitus (HCC) 12/24/2021   Essential hypertension 10/21/2021   Type 2 diabetes mellitus with hypoglycemia without coma, without long-term current use of insulin (HCC) 10/21/2021   Primary osteoarthritis involving multiple joints 10/21/2021   Mild intermittent asthma without complication 10/21/2021   History of herpes genitalis 10/21/2021   History of gout 10/21/2021   Gastroesophageal reflux disease without esophagitis 10/21/2021   Persistent cough 04/20/2021   Chronic vertigo 12/04/2020   COVID-19 04/16/2020   Neuropathic pain 04/12/2020   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   Gout 12/19/2019   Carotid artery stenosis 11/24/2019   Multiple joint pain 11/24/2019   Allergy to food 05/05/2018   Non-toxic nodular goiter 08/31/2017   Reflux    Asthma    Sjogren syndrome with inflammatory arthritis [facility])     Past Surgical History:  Procedure Laterality Date   APPENDECTOMY     BACK SURGERY  03/12/2010   L5   BREAST SURGERY     BIOPSY   HERNIA REPAIR     groin bilateral  NECK SURGERY     PELVIC LAPAROSCOPY  9147,8295   PILONIDAL CYST EXCISION     SALPINGOOPHORECTOMY  10/11/2002   SCOPE LSO-LEFT SEROUS CYSTADENOMA   SALPINGOOPHORECTOMY  04/12/2005   RSO/TOA   SHOULDER SURGERY Right 01/2018   TONSILLECTOMY AND ADENOIDECTOMY     TUBAL LIGATION     VAGINAL HYSTERECTOMY  01/10/2005   TVH, POSTERIOR REPAIR    OB History     Gravida  5   Para  2   Term      Preterm      AB  3   Living  2      SAB  3   IAB      Ectopic      Multiple      Live Births  2            Home Medications    Prior to Admission medications   Medication Sig Start Date  End Date Taking? Authorizing Provider  acetaminophen (TYLENOL) 650 MG CR tablet Take 650 mg by mouth as needed.      [provider]  albuterol (VENTOLIN HFA) 108 (90 Base) MCG/ACT inhaler Inhale 2 puffs into the lungs every 6 (six) hours as needed for wheezing or shortness of breath. 10/20/21   Marcine Matar, MD  ascorbic acid (VITAMIN C) 500 MG tablet Take 500 mg by mouth daily.    [provider]  atorvastatin (LIPITOR) 10 MG tablet 1 tab PO Q Mon/Wed/Frid 05/02/23   Marcine Matar, MD  b complex vitamins capsule Take 1 capsule by mouth daily. 1 chewable once daily.    [provider]  benzonatate (TESSALON) 200 MG capsule Take 1 capsule by mouth three times daily as needed for cough 05/11/23   Marcine Matar, MD  BIOTIN PO Take by mouth.    [provider]  Calcium 500-2.5 MG-MCG CHEW Chew 2 tablets by mouth daily. Patient not taking: Reported on 06/08/2023    [provider]  Cetirizine HCl (ZYRTEC PO) Take 10 mg by mouth.     [provider]  Cholecalciferol (VITAMIN D PO) Take by mouth. TAKES 2000     [provider]  Cinnamon 500 MG TABS Take 2 tablets by mouth daily. Patient not taking: Reported on 06/08/2023    [provider]  Continuous Glucose Receiver (FREESTYLE LIBRE 3 READER) DEVI UAD 05/02/23   Marcine Matar, MD  Continuous Glucose Sensor (FREESTYLE LIBRE 3 PLUS SENSOR) MISC Change sensor every 15 days. 05/02/23   Marcine Matar, MD  cyclobenzaprine (FLEXERIL) 10 MG tablet Take 1 tablet (10 mg total) by mouth 3 (three) times daily as needed. 05/02/23   Marcine Matar, MD  cycloSPORINE (RESTASIS) 0.05 % ophthalmic emulsion 1 drop 2 (two) times daily. Patient not taking: Reported on 06/08/2023    [provider]  dexlansoprazole (DEXILANT) 60 MG capsule Take 1 capsule (60 mg total) by mouth every morning. 05/02/23   Marcine Matar, MD  diclofenac Sodium (VOLTAREN) 1 % GEL Apply 2 g  topically daily. Once daily in the morning as needed.    [provider]  EPINEPHrine 0.3 mg/0.3 mL IJ SOAJ injection Inject 0.3 mg into the muscle as needed for anaphylaxis. 09/01/22   Marcine Matar, MD  folic acid (FOLVITE) 1 MG tablet Take 1 mg by mouth daily. 2 tablets daily 11/02/22   [provider]  Glucose 15 g PACK Take by mouth. Chew up to 4  tablets prn. Patient not taking: Reported on 06/08/2023    [provider]  glucose blood (FREESTYLE LITE) test strip Use as instructed 04/27/22   Marcine Matar, MD  hydrochlorothiazide (HYDRODIURIL) 25 MG tablet Take 1 tablet (25 mg total) by mouth 2 (two) times daily. 05/02/23   Marcine Matar, MD  ipratropium-albuterol (DUONEB) 0.5-2.5 (3) MG/3ML SOLN Take 3 mLs by nebulization every 6 (six) hours as needed. 10/20/21   Marcine Matar, MD  Lancets (FREESTYLE) lancets Use as instructed 04/27/22   Marcine Matar, MD  meclizine (ANTIVERT) 25 MG tablet Take 1 tablet (25 mg total) by mouth 2 (two) times daily as needed for dizziness. 12/31/22   Marcine Matar, MD  meloxicam (MOBIC) 15 MG tablet Take 1 tablet (15 mg total) by mouth daily. 10/20/21   Marcine Matar, MD  methotrexate (RHEUMATREX) 2.5 MG tablet Take 2.5 mg by mouth once a week. 8 pills once a week 11/02/22   [provider]  montelukast (SINGULAIR) 10 MG tablet Take 1 tablet (10 mg total) by mouth at bedtime. 05/02/23   Marcine Matar, MD  Multiple Vitamins-Iron (MULTIVITAMIN/IRON PO) Take by mouth.      [provider]  nebivolol (BYSTOLIC) 5 MG tablet Take 1 tablet (5 mg total) by mouth every morning. 05/02/23   Marcine Matar, MD  polyethylene glycol (MIRALAX / GLYCOLAX) 17 g packet Take 17 g by mouth daily.    [provider]  triamcinolone (NASACORT) 55 MCG/ACT nasal inhaler Place 2 sprays into the nose as needed.      [provider]  valACYclovir (VALTREX) 500 MG tablet Take 1 tablet (500 mg total)  by mouth daily. 05/02/23   Marcine Matar, MD  Vitamin D, Ergocalciferol, (DRISDOL) 1.25 MG (50000 UNIT) CAPS capsule Take 1 capsule by mouth once a week 06/01/23   Marcine Matar, MD  vitamin E 180 MG (400 UNITS) capsule Take 400 Units by mouth in the morning and at bedtime.    [provider]    Family History Family History  Problem Relation Age of Onset   Hypertension Mother    Heart disease Mother    Arthritis Mother    Hyperlipidemia Mother    Stroke Mother    Heart disease Father    Hypertension Father    Cancer Father        Prostate and lung   Alcohol abuse Father    Anxiety disorder Father    Cancer Paternal Grandmother        COLON CA   Breast cancer Maternal Aunt        LATE 50'S   Ovarian cancer Maternal Aunt     Social History Social History   Tobacco Use   Smoking status: Former   Smokeless tobacco: Never  Advertising account planner   Vaping status: Never Used  Substance Use Topics   Alcohol use: Yes    Comment: Rare   Drug use: No     Allergies   Codeine, Duratuss [phenylephrine-guaifenesin], Erythromycin, Hydrocodone-acetaminophen, Iodine, Latex, Oxycodone, Peanuts [nuts], Penicillins, Shellfish allergy, Vicodin [hydrocodone-acetaminophen], Augmentin [amoxicillin-pot clavulanate], and Phentermine   Review of Systems Review of Systems  Constitutional:  Positive for chills, fatigue and fever.  HENT:  Positive for congestion, ear pain, rhinorrhea and sore throat.   Respiratory:  Positive for cough, shortness of breath and wheezing.   Gastrointestinal:  Negative for diarrhea, nausea and vomiting.  Neurological:  Positive for headaches. Negative for dizziness.  Physical Exam Triage Vital Signs ED Triage Vitals [06/13/23 1436]  Encounter Vitals Group     BP 114/67     Systolic BP Percentile      Diastolic BP Percentile      Pulse Rate 78     Resp 20     Temp 98.5 F (36.9 C)     Temp Source Oral     SpO2 97 %     Weight      Height       Head Circumference      Peak Flow      Pain Score      Pain Loc      Pain Education      Exclude from Growth Chart    No data found.  Updated Vital Signs BP 114/67 (BP Location: Right Arm)   Pulse 78   Temp 98.5 F (36.9 C) (Oral)   Resp 20   SpO2 97%   Visual Acuity Right Eye Distance:   Left Eye Distance:   Bilateral Distance:    Right Eye Near:   Left Eye Near:    Bilateral Near:     Physical Exam Vitals and nursing note reviewed.  Constitutional:      Appearance: She is not ill-appearing.  HENT:     Head: Normocephalic and atraumatic.     Right Ear: Tympanic membrane and ear canal normal.     Left Ear: Tympanic membrane and ear canal normal.     Nose: No rhinorrhea.     Mouth/Throat:     Mouth: Mucous membranes are moist.     Pharynx: Posterior oropharyngeal erythema present. No oropharyngeal exudate.  Eyes:     General:        Right eye: No discharge.        Left eye: No discharge.     Conjunctiva/sclera: Conjunctivae normal.  Cardiovascular:     Rate and Rhythm: Normal rate.     Heart sounds: Normal heart sounds.  Pulmonary:     Effort: Pulmonary effort is normal. No respiratory distress.     Breath sounds: Normal breath sounds. No wheezing, rhonchi or rales.  Musculoskeletal:     Cervical back: Neck supple.  Lymphadenopathy:     Cervical: No cervical adenopathy.  Skin:    General: Skin is warm.  Neurological:     Mental Status: She is alert and oriented to person, place, and time.  Psychiatric:        Mood and Affect: Mood normal.      UC Treatments / Results  Labs (all labs ordered are listed, but only abnormal results are displayed) Labs Reviewed - No data to display  EKG   Radiology No results found.  Procedures Procedures (including critical care time)  Medications Ordered in UC Medications - No data to display  Initial Impression / Assessment and Plan / UC Course  I have reviewed the triage vital signs and the nursing  notes.  Pertinent labs & imaging results that were available during my care of the patient were reviewed by me and considered in my medical decision making (see chart for details).     68 year old female with past medical history of asthma, arthritis, type 2 diabetes, Sjogren's and tension presents with flulike for 8 days, tested positive for COVID several days ago.  He wants to be tested for flu.  Admits increased chest tightness and wheezing has been using her nebulizer treatment, needs a refill of her inhaler. Had fever  at onset of illness is nontoxic-appearing, vital signs are stable, lungs are clear to auscultation.  Point-of-care flu test is negative.  Final Clinical Impressions(s) / UC Diagnoses   Final diagnoses:  None   Discharge Instructions   None    ED Prescriptions   None    PDMP not reviewed this encounter.   Meliton Rattan, [place] 06/13/23 1504

## 2023-06-14 ENCOUNTER — Inpatient Hospital Stay: Payer: Self-pay | Admitting: Internal Medicine

## 2023-07-26 DIAGNOSIS — M2560 Stiffness of unspecified joint, not elsewhere classified: Secondary | ICD-10-CM | POA: Diagnosis not present

## 2023-07-26 DIAGNOSIS — M109 Gout, unspecified: Secondary | ICD-10-CM | POA: Diagnosis not present

## 2023-07-26 DIAGNOSIS — M0609 Rheumatoid arthritis without rheumatoid factor, multiple sites: Secondary | ICD-10-CM | POA: Diagnosis not present

## 2023-07-26 DIAGNOSIS — M3505 Sjogren syndrome with inflammatory arthritis: Secondary | ICD-10-CM | POA: Diagnosis not present

## 2023-07-26 DIAGNOSIS — M1991 Primary osteoarthritis, unspecified site: Secondary | ICD-10-CM | POA: Diagnosis not present

## 2023-07-26 DIAGNOSIS — M2540 Effusion, unspecified joint: Secondary | ICD-10-CM | POA: Diagnosis not present

## 2023-08-30 ENCOUNTER — Ambulatory Visit: Payer: Medicare Other | Admitting: Internal Medicine

## 2023-08-30 DIAGNOSIS — M0609 Rheumatoid arthritis without rheumatoid factor, multiple sites: Secondary | ICD-10-CM | POA: Diagnosis not present

## 2023-09-26 DIAGNOSIS — M79671 Pain in right foot: Secondary | ICD-10-CM | POA: Diagnosis not present

## 2023-10-04 ENCOUNTER — Encounter: Payer: Self-pay | Admitting: Internal Medicine

## 2023-10-04 ENCOUNTER — Ambulatory Visit: Attending: Internal Medicine | Admitting: Internal Medicine

## 2023-10-04 ENCOUNTER — Ambulatory Visit: Payer: Self-pay

## 2023-10-04 VITALS — BP 118/74 | HR 75 | Temp 97.8°F | Ht 67.0 in | Wt 216.0 lb

## 2023-10-04 DIAGNOSIS — E559 Vitamin D deficiency, unspecified: Secondary | ICD-10-CM

## 2023-10-04 DIAGNOSIS — E669 Obesity, unspecified: Secondary | ICD-10-CM

## 2023-10-04 DIAGNOSIS — R221 Localized swelling, mass and lump, neck: Secondary | ICD-10-CM

## 2023-10-04 DIAGNOSIS — R002 Palpitations: Secondary | ICD-10-CM

## 2023-10-04 DIAGNOSIS — I10 Essential (primary) hypertension: Secondary | ICD-10-CM | POA: Diagnosis not present

## 2023-10-04 DIAGNOSIS — M5416 Radiculopathy, lumbar region: Secondary | ICD-10-CM

## 2023-10-04 DIAGNOSIS — E119 Type 2 diabetes mellitus without complications: Secondary | ICD-10-CM

## 2023-10-04 DIAGNOSIS — R252 Cramp and spasm: Secondary | ICD-10-CM

## 2023-10-04 DIAGNOSIS — Z6833 Body mass index (BMI) 33.0-33.9, adult: Secondary | ICD-10-CM

## 2023-10-04 DIAGNOSIS — E1169 Type 2 diabetes mellitus with other specified complication: Secondary | ICD-10-CM

## 2023-10-04 DIAGNOSIS — R600 Localized edema: Secondary | ICD-10-CM | POA: Diagnosis not present

## 2023-10-04 DIAGNOSIS — I152 Hypertension secondary to endocrine disorders: Secondary | ICD-10-CM

## 2023-10-04 DIAGNOSIS — K2100 Gastro-esophageal reflux disease with esophagitis, without bleeding: Secondary | ICD-10-CM | POA: Diagnosis not present

## 2023-10-04 DIAGNOSIS — E785 Hyperlipidemia, unspecified: Secondary | ICD-10-CM | POA: Diagnosis not present

## 2023-10-04 DIAGNOSIS — R079 Chest pain, unspecified: Secondary | ICD-10-CM | POA: Diagnosis not present

## 2023-10-04 DIAGNOSIS — L309 Dermatitis, unspecified: Secondary | ICD-10-CM

## 2023-10-04 LAB — POCT GLYCOSYLATED HEMOGLOBIN (HGB A1C): HbA1c, POC (controlled diabetic range): 6.4 % (ref 0.0–7.0)

## 2023-10-04 LAB — GLUCOSE, POCT (MANUAL RESULT ENTRY): POC Glucose: 135 mg/dL — AB (ref 70–99)

## 2023-10-04 MED ORDER — CLINDAMYCIN HCL 300 MG PO CAPS: 300.0000 mg | ORAL_CAPSULE | Freq: Three times a day (TID) | ORAL | 0 refills | Status: DC

## 2023-10-04 MED ORDER — FUROSEMIDE 20 MG PO TABS: 20.0000 mg | ORAL_TABLET | Freq: Every day | ORAL | 3 refills | Status: AC | PRN

## 2023-10-04 MED ORDER — TRAMADOL HCL 50 MG PO TABS
50.0000 mg | ORAL_TABLET | Freq: Three times a day (TID) | ORAL | 0 refills | Status: DC | PRN
Start: 2023-10-04 — End: 2023-10-05

## 2023-10-04 MED ORDER — CYCLOBENZAPRINE HCL 10 MG PO TABS: 10.0000 mg | ORAL_TABLET | Freq: Three times a day (TID) | ORAL | 1 refills | Status: DC | PRN

## 2023-10-04 NOTE — Progress Notes (Addendum)
 Patient ID: [name], female    DOB: Aug 11, 1955  MRN: [id number]  CC: Diabetes (Dm f/u. Med refill./Pain on lower R back, dark urine/High BP readings - headaches/Nausea, swelling of gland under R side of jaw, pain radiating to R eye /Rash on R leg and spreading, white blisters )   Subjective: [name] is a 68 y.o. female who presents for chronic ds management. Her concerns today include:  Patient with history of DM type II, HTN, HL, Sjogren syndrome, Raynaud's phenomenon, OA knees/ankles/shoulders/hips, HSV 2, asthma, gout, thyroid  nodules followed by Dr. Eletha, genital herpes, GERD, vaginal wall prolapse, OAB.     Discussed the use of AI scribe software for clinical note transcription with the patient, who gave verbal consent to proceed.  History of Present Illness [name] is a 68 year old female with diabetes and Sjogren's syndrome who presents with worsening acid reflux and sciatica.  In regards to her diabetes, she remains diet controlled with A1c today of 6.4 and blood sugar of 135.  Looking at her continuous glucose monitor, she has been in target range 99% of the time and elevated only 1% of the times with no low blood sugar episodes.    She experiences worsening acid reflux over the past two weeks, with persistent acid rising and burning in her throat despite taking Dexilant .  She vomited 1 time after experiencing increased nausea and burning in the throat.  Reports having been tried with omeprazole, pantoprazole and Nexium in the past all of which eventually stopped working for her.  She avoids eating spicy foods.  Admits that she has been eating tomatoes recently on her salad and likes to drink lemon water.  She has over the past 6 weeks been having to lay flat because of the flareup of sciatica.  She feels this has contributed to the worsening acid reflux as well.  Over the past several weeks, she has had intermittent episodes of heaviness in the middle of her  chest associated with some fluttering of the heart.  The fluttering can last 5 to 10 seconds then her heart will hurt that she describes as an aching sensation.  She sometimes gets pain going down the left arm.  Severe sciatica has been present for six weeks, starting after lifting her grandson intermittently while she was baby sitting for several days.  Her grandson weighs between 24 to 28 pounds. Pain is in the right lower back, radiating down posterior aspect both legs to her feet, with numbness and tingling, especially in the mornings. Movement provides relief, but sitting or lifting increases pain.  She has had surgery on her lower back around 2010.  She was told by her neurosurgeon Dr. [name] that she has arthritis, scoliosis and some bulging disc at the L3-L4 level.  Ice packs and Tylenol offer minimal relief.  Heat makes it worse.    She notes recent high blood pressure readings without dietary changes. Her feet stay swollen all the time.  She wonders if there is an issue with her heart.  For blood pressure, she is on HCTZ 25 mg twice a day and Nebivolol  5 mg daily.  She is on atorvastatin  10 mg 3 times a week for cholesterol.  Sjogren's syndrome causes occasional swelling of glands under the jawline, with recent swelling of the right submandibular gland causing pain and difficulty swallowing.  She has been using warm compresses.  It has gone down some but are not remains on the right  anterior neck just below the submandibular gland that is painful.  Hurts on the right side when she swallows.  Complains of itchy rash on the right shin that started back in September of last year when cast was removed from her lower leg.  Since then it has spread and appears as white bumps.  She has been using hydrocortisone cream over-the-counter which helps.  2 of her grandchildren had molluscum.  Vitamin D  level when checked a few months ago was 71.  She was on high-dose vitamin D  once a week.  Advised that she  cuts back to every other week.    Patient Active Problem List   Diagnosis Date Noted   Dry mouth 06/24/2022   Joint swelling 06/24/2022   Other specified abnormal immunological findings in serum 06/24/2022   Pain in limb 06/24/2022   Insulin  resistance 05/19/2022   Low mean corpuscular hemoglobin concentration (MCHC) 05/19/2022   Other fatigue 05/05/2022   SOBOE (shortness of breath on exertion) 05/05/2022   Right foot pain 05/05/2022   Depression screen 05/05/2022   Arthritis 03/23/2022   Hyperlipidemia associated with type 2 diabetes mellitus (HCC) 12/24/2021   Essential hypertension 10/21/2021   Type 2 diabetes mellitus with hypoglycemia without coma, without long-term current use of insulin  (HCC) 10/21/2021   Primary osteoarthritis involving multiple joints 10/21/2021   Mild intermittent asthma without complication 10/21/2021   History of herpes genitalis 10/21/2021   History of gout 10/21/2021   Gastroesophageal reflux disease without esophagitis 10/21/2021   Persistent cough 04/20/2021   Chronic vertigo 12/04/2020   COVID-19 04/16/2020   Neuropathic pain 04/12/2020   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   Gout 12/19/2019   Carotid artery stenosis 11/24/2019   Multiple joint pain 11/24/2019   Allergy to food 05/05/2018   Non-toxic nodular goiter 08/31/2017   Reflux    Asthma    Sjogren syndrome with inflammatory arthritis (HCC)      Current Outpatient Medications on File Prior to Visit  Medication Sig Dispense Refill   acetaminophen (TYLENOL) 650 MG CR tablet Take 650 mg by mouth as needed.       albuterol  (VENTOLIN  HFA) 108 (90 Base) MCG/ACT inhaler Inhale 2 puffs into the lungs every 6 (six) hours as needed for wheezing or shortness of breath. 8 g 1   ascorbic acid (VITAMIN C) 500 MG tablet Take 500 mg by mouth daily.     atorvastatin  (LIPITOR) 10 MG tablet 1 tab PO Q Mon/Wed/Frid 36 tablet 2   b complex vitamins capsule Take 1 capsule by mouth daily. 1  chewable once daily.     benzonatate  (TESSALON ) 200 MG capsule Take 1 capsule by mouth three times daily as needed for cough 30 capsule 0   BIOTIN PO Take by mouth.     Cetirizine HCl (ZYRTEC PO) Take 10 mg by mouth.      Cholecalciferol (VITAMIN D  PO) Take by mouth. TAKES 2000      Continuous Glucose Receiver (FREESTYLE LIBRE 3 READER) DEVI UAD 1 each 0   Continuous Glucose Sensor (FREESTYLE LIBRE 3 PLUS SENSOR) MISC Change sensor every 15 days. 2 each 12   cyclobenzaprine  (FLEXERIL ) 10 MG tablet Take 1 tablet (10 mg total) by mouth 3 (three) times daily as needed. 90 tablet 1   cycloSPORINE (RESTASIS) 0.05 % ophthalmic emulsion 1 drop 2 (two) times daily.     dexlansoprazole  (DEXILANT ) 60 MG capsule Take 1 capsule (60 mg total) by mouth every morning. 30 capsule 5  diclofenac Sodium (VOLTAREN) 1 % GEL Apply 2 g topically daily. Once daily in the morning as needed.     EPINEPHrine  0.3 mg/0.3 mL IJ SOAJ injection Inject 0.3 mg into the muscle as needed for anaphylaxis. 1 each 1   folic acid  (FOLVITE ) 1 MG tablet Take 1 mg by mouth daily. 2 tablets daily     glucose blood (FREESTYLE LITE) test strip Use as instructed 100 each 12   hydrochlorothiazide  (HYDRODIURIL ) 25 MG tablet Take 1 tablet (25 mg total) by mouth 2 (two) times daily. 180 tablet 2   ipratropium-albuterol  (DUONEB) 0.5-2.5 (3) MG/3ML SOLN Take 3 mLs by nebulization every 6 (six) hours as needed. 100 mL 3   Lancets (FREESTYLE) lancets Use as instructed 100 each 12   meclizine  (ANTIVERT ) 25 MG tablet Take 1 tablet (25 mg total) by mouth 2 (two) times daily as needed for dizziness. 30 tablet 1   meloxicam  (MOBIC ) 15 MG tablet Take 1 tablet (15 mg total) by mouth daily. 30 tablet 5   montelukast  (SINGULAIR ) 10 MG tablet Take 1 tablet (10 mg total) by mouth at bedtime. 30 tablet 11   Multiple Vitamins-Iron (MULTIVITAMIN/IRON PO) Take by mouth.       nebivolol  (BYSTOLIC ) 5 MG tablet Take 1 tablet (5 mg total) by mouth every morning. 90  tablet 3   polyethylene glycol (MIRALAX / GLYCOLAX) 17 g packet Take 17 g by mouth daily.     Povidone, PF, (IVIZIA DRY EYES) 0.5 % SOLN Apply 0.5 % to eye daily as needed.     triamcinolone (NASACORT) 55 MCG/ACT nasal inhaler Place 2 sprays into the nose as needed.       valACYclovir  (VALTREX ) 500 MG tablet Take 1 tablet (500 mg total) by mouth daily. 30 tablet 11   Vitamin D , Ergocalciferol , (DRISDOL ) 1.25 MG (50000 UNIT) CAPS capsule Take 1 capsule by mouth once a week 12 capsule 1   vitamin E 180 MG (400 UNITS) capsule Take 400 Units by mouth in the morning and at bedtime.     Calcium  500-2.5 MG-MCG CHEW Chew 2 tablets by mouth daily. (Patient not taking: Reported on 10/04/2023)     Cinnamon 500 MG TABS Take 2 tablets by mouth daily. (Patient not taking: Reported on 10/04/2023)     Glucose 15 g PACK Take by mouth. Chew up to 4 tablets prn. (Patient not taking: Reported on 10/04/2023)     methotrexate (RHEUMATREX) 2.5 MG tablet Take 2.5 mg by mouth once a week. 8 pills once a week (Patient not taking: Reported on 10/04/2023)     No current facility-administered medications on file prior to visit.    Allergies  Allergen Reactions   Codeine Swelling   Duratuss [Phenylephrine -Guaifenesin] Hives   Erythromycin Swelling and Hives    ALLERGIC TO MYCINS  ALLERGIC TO MYCINS ALLERGIC TO MYCINS    ALLERGIC TO MYCINS   Hydrocodone-Acetaminophen Swelling   Iodine Swelling   Latex Swelling   Oxycodone Swelling and Other (See Comments)   Peanuts [Nuts] Swelling   Penicillins Swelling and Other (See Comments)   Shellfish Allergy Swelling   Vicodin [Hydrocodone-Acetaminophen] Swelling   Grass Pollen(K-O-R-T-Swt Vern) Rash   Ace Inhibitors    Acetaminophen    Augmentin [Amoxicillin-Pot Clavulanate]    Clavulanic Acid    Hydrocodone    Phentermine     Other Reaction(s): Not available    Social History   Socioeconomic History   Marital status: Single    Spouse name: Not on file  Number  of children: Not on file   Years of education: Not on file   Highest education level: Bachelor's degree (e.g., BA, AB, BS)  Occupational History   Not on file  Tobacco Use   Smoking status: Former   Smokeless tobacco: Never  Vaping Use   Vaping status: Never Used  Substance and Sexual Activity   Alcohol use: Yes    Comment: Rare   Drug use: No   Sexual activity: Not Currently    Birth control/protection: Post-menopausal  Other Topics Concern   Not on file  Social History Narrative   Not on file   Social Drivers of Health   Financial Resource Strain: Low Risk  (04/29/2023)   Overall Financial Resource Strain (CARDIA)    Difficulty of Paying Living Expenses: Not hard at all  Food Insecurity: No Food Insecurity (04/29/2023)   Hunger Vital Sign    Worried About Running Out of Food in the Last Year: Never true    Ran Out of Food in the Last Year: Never true  Transportation Needs: No Transportation Needs (04/29/2023)   PRAPARE - Administrator, Civil Service (Medical): No    Lack of Transportation (Non-Medical): No  Physical Activity: Inactive (04/29/2023)   Exercise Vital Sign    Days of Exercise per Week: 0 days    Minutes of Exercise per Session: 0 min  Stress: Stress Concern Present (04/29/2023)   [employer] of Occupational Health - Occupational Stress Questionnaire    Feeling of Stress : To some extent  Social Connections: Moderately Integrated (04/29/2023)   Social Connection and Isolation Panel    Frequency of Communication with Friends and Family: More than three times a week    Frequency of Social Gatherings with Friends and Family: More than three times a week    Attends Religious Services: More than 4 times per year    Active Member of Clubs or Organizations: Yes    Attends Banker Meetings: More than 4 times per year    Marital Status: Divorced  Intimate Partner Violence: Not At Risk (11/16/2022)   Humiliation, Afraid, Rape, and Kick  questionnaire    Fear of Current or Ex-Partner: No    Emotionally Abused: No    Physically Abused: No    Sexually Abused: No    Family History  Problem Relation Age of Onset   Hypertension Mother    Heart disease Mother    Arthritis Mother    Hyperlipidemia Mother    Stroke Mother    Heart disease Father    Hypertension Father    Cancer Father        Prostate and lung   Alcohol abuse Father    Anxiety disorder Father    Cancer Paternal Grandmother        COLON CA   Breast cancer Maternal Aunt        LATE 50'S   Ovarian cancer Maternal Aunt     Past Surgical History:  Procedure Laterality Date   APPENDECTOMY     BACK SURGERY  03/12/2010   L5   BREAST SURGERY     BIOPSY   HERNIA REPAIR     groin bilateral   NECK SURGERY     PELVIC LAPAROSCOPY  8009,8006   PILONIDAL CYST EXCISION     SALPINGOOPHORECTOMY  10/11/2002   SCOPE LSO-LEFT SEROUS CYSTADENOMA   SALPINGOOPHORECTOMY  04/12/2005   RSO/TOA   SHOULDER SURGERY Right 01/2018   TONSILLECTOMY AND ADENOIDECTOMY  TUBAL LIGATION     VAGINAL HYSTERECTOMY  01/10/2005   TVH, POSTERIOR REPAIR    ROS: Review of Systems Negative except as stated above  PHYSICAL EXAM: BP 118/74 (BP Location: Right Arm, Patient Position: Sitting, Cuff Size: Large)   Pulse 75   Temp 97.8 F (36.6 C) (Oral)   Ht 5' 7 (1.702 m)   Wt 216 lb (98 kg)   SpO2 100%   BMI 33.83 kg/m   Wt Readings from Last 3 Encounters:  10/04/23 216 lb (98 kg)  06/08/23 212 lb (96.2 kg)  05/02/23 210 lb (95.3 kg)    Physical Exam  General appearance - alert, well appearing, and in no distress Mental status - normal mood, behavior, speech, dress, motor activity, and thought processes Mouth -no oral lesions seen.  She has an area of about 3 cm area that is raised and adjacent to and below the tongue on the right side that is tender to touch Neck -submandibular gland on both sides do not appear enlarged or swollen at this time.  She has a palpable  nodule about 2 to 3 cm in size on the right side that seems to distal to the right submandibular gland.  It is mildly tender to touch. Lymphatics -no other cervical or axillary lymphadenopathy appreciated. Chest - clear to auscultation, no wheezes, rales or rhonchi, symmetric air entry Heart - normal rate, regular rhythm, normal S1, S2, no murmurs, rubs, clicks or gallops Neurological -straight leg raise on both side negative.  Power in both lower extremities 5/5 bilaterally.  Slight decreased sensation in the right lower leg laterally.  Gait is stable. MSK: Slight tenderness on palpation of the lumbar spine and surrounding paraspinal muscle on the right side. Extremities - peripheral pulses normal, no pedal edema, no clubbing or cyanosis Skin -she has hypopigmented/white spots noted on the right shin      Latest Ref Rng & Units 03/29/2023   12:09 PM 05/05/2022   10:08 AM 02/11/2022   10:43 AM  CMP  Glucose 70 - 99 mg/dL 890  90    BUN 8 - 23 mg/dL 26  24    Creatinine 9.55 - 1.00 mg/dL 9.07  9.10    Sodium 864 - 145 mmol/L 137  142    Potassium 3.5 - 5.1 mmol/L 3.6  4.1    Chloride 98 - 111 mmol/L 100  101    CO2 22 - 32 mmol/L 29  27    Calcium  8.9 - 10.3 mg/dL 9.2  9.2    Total Protein 6.0 - 8.5 g/dL  6.8  6.9   Total Bilirubin 0.0 - 1.2 mg/dL  0.3  0.4   Alkaline Phos 44 - 121 IU/L  115  114   AST 0 - 40 IU/L  24  27   ALT 0 - 32 IU/L  29  24    Lipid Panel     Component Value Date/Time   CHOL 156 01/11/2022 1048   TRIG 95 01/11/2022 1048   HDL 51 01/11/2022 1048   CHOLHDL 3.1 01/11/2022 1048   LDLCALC 87 01/11/2022 1048    CBC    Component Value Date/Time   WBC 4.4 03/29/2023 1209   RBC 4.79 03/29/2023 1209   HGB 13.2 03/29/2023 1209   HGB 13.0 05/05/2022 1008   HCT 41.0 03/29/2023 1209   HCT 41.7 05/05/2022 1008   PLT 245 03/29/2023 1209   PLT 188 05/05/2022 1008   MCV 85.6 03/29/2023 1209  MCV 81 05/05/2022 1008   MCH 27.6 03/29/2023 1209   MCHC 32.2  03/29/2023 1209   RDW 14.8 03/29/2023 1209   RDW 13.2 05/05/2022 1008   LYMPHSABS 1.4 05/05/2022 1008   MONOABS 0.4 03/17/2009 1053   EOSABS 0.1 05/05/2022 1008   BASOSABS 0.0 05/05/2022 1008    ASSESSMENT AND PLAN: 1. Type 2 diabetes mellitus with obesity (HCC) (Primary) At goal.  Continue healthy eating habits. - POCT glucose (manual entry) - POCT glycosylated hemoglobin (Hb A1C)  2. Hypertension associated with diabetes (HCC) At goal.  Continue HCTZ and Nebivolol . - Comprehensive metabolic panel  3. Hyperlipidemia associated with type 2 diabetes mellitus (HCC) Continue atorvastatin  3 times a week. - Lipid panel - COMPLETE METABOLIC PANEL WITH eGFR  4. Dermatitis Of questionable etiology.  Patient reports this started since September of last year and has spread up the shin.  Grandchildren recently had molluscum but the lesions on her leg and not skin colored and umbilicated.  Will refer to dermatology - Ambulatory referral to Dermatology  5. Neck mass Suspect this may either be the tail of the submandibular gland that remains inflamed, has a stone or possible enlarged lymph node.  I recommend that she continues to use warm compresses and continue to massage the gland.  Will give a 1 week course of clindamycin  as well.  If this does not resolve, will need imaging study  6. Gastroesophageal reflux disease with esophagitis without hemorrhage Patient experiencing flareup of GERD associated with dietary indiscretions and having to lay flat due to flare of sciatica.  Encouraged her to try sleep with her head a little elevated and to eat her last meal at least 2 to 3 hours before going to bed at night.  Advised to avoid foods that have tomato based in it and avoid juices and citrus fruits/drinks.  She declines trying another PPI as she has been tried with all the others in the past.  She defers on referral to GI at this time.  If symptoms do not improve with lifestyle changes, she will let  me know so that we can submit a referral.  7. Chest pain in adult Chest pain sounds suspicious.  She does have risk factors for heart disease.  Will refer her to cardiology. - Ambulatory referral to Cardiology  8. Palpitations - TSH+T4F+T3Free  9. Muscle cramp Patient requested refill on Flexeril . - cyclobenzaprine  (FLEXERIL ) 10 MG tablet; Take 1 tablet (10 mg total) by mouth 3 (three) times daily as needed.  Dispense: 90 tablet; Refill: 1  10. Edema of both lower legs - Brain natriuretic peptide - furosemide  (LASIX ) 20 MG tablet; Take 1 tablet (20 mg total) by mouth daily as needed.  Dispense: 30 tablet; Refill: 3  11. Lumbar radiculopathy Started after she had been lifting her grandchild intermittently for several days.  Advised to avoid any lifting.  Continue meloxicam .  Will give short-term course of tramadol .  Advised that the medication can cause drowsiness.  [place]  controlled substance reporting system reviewed.  Will refer for some physical therapy.  Also advised her to call her neurosurgeon and try to get in for a follow-up appointment.  She plans to do so. - traMADol  (ULTRAM ) 50 MG tablet; Take 1 tablet (50 mg total) by mouth every 8 (eight) hours as needed.  Dispense: 30 tablet; Refill: 0 - Ambulatory referral to Physical Therapy  12. Vitamin D  deficiency - VITAMIN D  25 Hydroxy (Vit-D Deficiency, Fractures)  I spent 45 minutes dedicated to  the care of this patient on the day of this visit to include previsit review of chart, face-to-face time with patient discussing diagnosis and management and post visit entering of orders.  More than 50% of the time was spent in face-to-face discussion.   Addendum 10/19/2023: Spoke with patient today.  She states that she touched base with her neurosurgeon and he wanted her to have an MRI of the lumbar spine first before she sees him.  Patient states that she was called by physical therapy and will be touching base with them this week.   I told patient that we will move forward with ordering the MRI.  Patient was given the opportunity to ask questions.  Patient verbalized understanding of the plan and was able to repeat key elements of the plan.   This documentation was completed using Paediatric nurse.  Any transcriptional errors are unintentional.  Orders Placed This Encounter  Procedures   POCT glucose (manual entry)   POCT glycosylated hemoglobin (Hb A1C)     Requested Prescriptions    No prescriptions requested or ordered in this encounter    No follow-ups on file.  Barnie Louder, MD, FACP

## 2023-10-04 NOTE — Patient Instructions (Signed)
 VISIT SUMMARY:  During today's visit, we discussed your worsening acid reflux, severe sciatica, recent high blood pressure readings, swelling in your feet, and swelling and pain in your submandibular region. We also reviewed your well-controlled hypertension and diabetes. A comprehensive plan was developed to address each of these issues.  YOUR PLAN:  -GASTROESOPHAGEAL REFLUX DISEASE (GERD): GERD is a condition where stomach acid frequently flows back into the tube connecting your mouth and stomach, causing irritation. To help manage your symptoms, avoid tomatoes and citrus juices, and try to eat your last meal 2-3 hours before lying down. Sleeping with your head elevated may also provide relief.  -CHEST PAIN AND PALPITATIONS: Your intermittent chest pain and palpitations may be related to acid reflux, but we need to rule out any cardiac causes. You will be referred to a cardiologist for further evaluation, and we will check your thyroid  levels to rule out hyperthyroidism.  -SCIATICA: Sciatica is pain that radiates along the path of the sciatic nerve, which extends from your lower back through your hips and buttocks and down each leg. You will be prescribed a short course of tramadol and continue taking cyclobenzaprine . We will refer you to physical therapy and advise you to contact your neurosurgeon, Dr. [name]. If your symptoms do not improve, an MRI may be considered.  -SWELLING AND PAIN IN SUBMANDIBULAR REGION: The swelling and pain in your submandibular region may be due to inflammation of the lymph nodes or glands, possibly from an infection. Apply warm compresses to the area and take the prescribed antibiotics. If symptoms persist, an imaging study may be needed.  -LEG RASH: The rash on your right leg may be molluscum contagiosum, a viral infection that causes small, raised, pearl-like bumps on the skin. You will be referred to [facility] for further evaluation.  -PERIPHERAL  EDEMA: Peripheral edema is swelling in the lower limbs, often due to fluid retention. We will add a low dose of furosemide as needed and advise you to avoid shoes with straps. We will also check your BNP level to screen for congestive heart failure.  -HYPERTENSION: Your hypertension is well controlled with your current medication regimen. Continue taking your current antihypertensive medications as prescribed.  -TYPE 2 DIABETES MELLITUS: Your type 2 diabetes is well controlled with an A1c of 6.4%. Continue with your excellent glucose monitoring and current management plan.  INSTRUCTIONS:  Please follow up with the cardiologist for your chest pain and palpitations evaluation. Contact your neurosurgeon, Dr. [name], regarding your sciatica. If your symptoms do not improve, we may consider an MRI. Apply warm compresses and take the prescribed antibiotics for the swelling and pain in your submandibular region. Follow up with [facility] for the rash on your right leg. We will check your BNP level to screen for congestive heart failure. Continue with your current antihypertensive and diabetes management plans.

## 2023-10-04 NOTE — Telephone Encounter (Deleted)
[place]

## 2023-10-04 NOTE — Telephone Encounter (Signed)
 Reason for Disposition  [1] Prescription not at pharmacy AND [2] was prescribed today by PCP    Needs revision of medication order  Protocols used: PCP Call - No Triage-P-[facility]   Per [facility] pharmacy, Tramadol order needs revision. Insurance will not pay for more than 5 days for acute related symptoms. Patient needs a new order for 5 days only, then a new prescription following that for additional needs.

## 2023-10-05 ENCOUNTER — Ambulatory Visit: Payer: Self-pay | Admitting: Internal Medicine

## 2023-10-05 LAB — BRAIN NATRIURETIC PEPTIDE

## 2023-10-05 MED ORDER — TRAMADOL HCL 50 MG PO TABS: 50.0000 mg | ORAL_TABLET | Freq: Three times a day (TID) | ORAL | 0 refills | Status: DC | PRN

## 2023-10-05 NOTE — Addendum Note (Signed)
 Addended by: VICCI SOBER B on: 10/05/2023 08:07 AM   Modules accepted: Orders

## 2023-10-05 NOTE — Telephone Encounter (Signed)
 She had already picked up the original rxn for Tramadol #30 or the one I attempted to resend upon their request for the #15?

## 2023-10-05 NOTE — Telephone Encounter (Signed)
 I try to resend the rxn for 5 day supply which would be 1 tab Q 8hrs as needed #15. Even when I change the send option to NORMAL, it still prints. I tried doing it twice. Please call her pharmacy and give instructions to change as stated. If they can not take it over the phone, then save the printed prescription for me to sign tomorrow to be faxed in. Thanks.

## 2023-10-05 NOTE — Telephone Encounter (Signed)
 the one for the tramadol  #15

## 2023-10-05 NOTE — Telephone Encounter (Signed)
 Pharmacy received order for Tramadol. Patient had pick it up already.

## 2023-10-07 LAB — COMPREHENSIVE METABOLIC PANEL WITH GFR
ALT: 45 IU/L — ABNORMAL HIGH (ref 0–32)
AST: 33 IU/L (ref 0–40)
Albumin: 4.3 g/dL (ref 3.9–4.9)
Alkaline Phosphatase: 150 IU/L — ABNORMAL HIGH (ref 44–121)
BUN/Creatinine Ratio: 22 (ref 12–28)
BUN: 19 mg/dL (ref 8–27)
Bilirubin Total: 0.3 mg/dL (ref 0.0–1.2)
CO2: 25 mmol/L (ref 20–29)
Calcium: 9.3 mg/dL (ref 8.7–10.3)
Chloride: 100 mmol/L (ref 96–106)
Creatinine, Ser: 0.86 mg/dL (ref 0.57–1.00)
Globulin, Total: 2.9 g/dL (ref 1.5–4.5)
Glucose: 100 mg/dL — ABNORMAL HIGH (ref 70–99)
Potassium: 3.5 mmol/L (ref 3.5–5.2)
Sodium: 142 mmol/L (ref 134–144)
Total Protein: 7.2 g/dL (ref 6.0–8.5)
eGFR: 74 mL/min/{1.73_m2} (ref >59)

## 2023-10-07 LAB — LIPID PANEL
Chol/HDL Ratio: 3.7 ratio (ref 0.0–4.4)
Cholesterol, Total: 180 mg/dL (ref 100–199)
HDL: 49 mg/dL (ref >39)
LDL Chol Calc (NIH): 105 mg/dL — ABNORMAL HIGH (ref 0–99)
Triglycerides: 145 mg/dL (ref 0–149)
VLDL Cholesterol Cal: 26 mg/dL (ref 5–40)

## 2023-10-07 LAB — VITAMIN D 25 HYDROXY (VIT D DEFICIENCY, FRACTURES): Vit D, 25-Hydroxy: 62.9 ng/mL (ref 30.0–100.0)

## 2023-10-07 LAB — TSH+T4F+T3FREE
Free T4: 1.27 ng/dL (ref 0.82–1.77)
T3, Free: 3.1 pg/mL (ref 2.0–4.4)
TSH: 2.85 u[IU]/mL (ref 0.450–4.500)

## 2023-10-12 ENCOUNTER — Ambulatory Visit

## 2023-10-17 ENCOUNTER — Telehealth: Payer: Self-pay | Admitting: Internal Medicine

## 2023-10-17 NOTE — Telephone Encounter (Signed)
 Copied from CRM [id number]. Topic: Clinical - Medical Advice >> Oct 17, 2023 12:32 PM Delon T wrote: Reason for CRM: finished antibiotic but the lump is still on the right side of jaw, tongue is swollen underneath  please call [phone]

## 2023-10-18 DIAGNOSIS — M1991 Primary osteoarthritis, unspecified site: Secondary | ICD-10-CM | POA: Diagnosis not present

## 2023-10-18 DIAGNOSIS — M2540 Effusion, unspecified joint: Secondary | ICD-10-CM | POA: Diagnosis not present

## 2023-10-18 DIAGNOSIS — M3505 Sjogren syndrome with inflammatory arthritis: Secondary | ICD-10-CM | POA: Diagnosis not present

## 2023-10-18 DIAGNOSIS — M0609 Rheumatoid arthritis without rheumatoid factor, multiple sites: Secondary | ICD-10-CM | POA: Diagnosis not present

## 2023-10-18 DIAGNOSIS — M2560 Stiffness of unspecified joint, not elsewhere classified: Secondary | ICD-10-CM | POA: Diagnosis not present

## 2023-10-18 DIAGNOSIS — M109 Gout, unspecified: Secondary | ICD-10-CM | POA: Diagnosis not present

## 2023-10-19 NOTE — Addendum Note (Signed)
 Addended by: VICCI SOBER B on: 10/19/2023 01:51 PM   Modules accepted: Orders

## 2023-10-19 NOTE — Telephone Encounter (Signed)
 Phone call returned to patient today.  Patient reports that the swelling on the right side of the neck got a little better while she was taking the antibiotics but then returned with pain when antibiotics was completed.  Advised patient that the next step would be for us  to order a CAT scan of the neck.  She saw her rheumatologist yesterday and she said the same thing.  She states her rheumatologist got her in with an ear nose and throat specialist at [facility] and that is scheduled for this Friday.  Patient will wait until she sees ear nose and throat specialist this Friday and if they do not order the CAT scan of the neck, she will call me back and let me know so that we can move forward with ordering it.  She states she will have them send me their note as well.

## 2023-10-21 DIAGNOSIS — M3500 Sicca syndrome, unspecified: Secondary | ICD-10-CM | POA: Diagnosis not present

## 2023-10-21 DIAGNOSIS — K1120 Sialoadenitis, unspecified: Secondary | ICD-10-CM | POA: Diagnosis not present

## 2023-10-27 DIAGNOSIS — L308 Other specified dermatitis: Secondary | ICD-10-CM | POA: Diagnosis not present

## 2023-10-27 NOTE — Therapy (Signed)
 OUTPATIENT PHYSICAL THERAPY THORACOLUMBAR EVALUATION   Patient Name: [name] MRN: [id number] DOB:09-26-1955, 68 y.o., female Today's Date: 11/01/2023  END OF SESSION:  PT End of Session - 11/01/23 1141     Visit Number 1    Number of Visits 16    Date for PT Re-Evaluation 12/27/23    Authorization Type UHC MCR    PT Start Time 1103    PT Stop Time 1145    PT Time Calculation (min) 42 min    Activity Tolerance Patient tolerated treatment well;Patient limited by pain    Behavior During Therapy [facility] for tasks assessed/performed          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   HTN (hypertension)    Joint pain    Lactose intolerance    Multiple food allergies    OA (osteoarthritis)    Reflux    Sjogren's syndrome (HCC)    SOB (shortness of breath)    Swallowing difficulty    Thyroid  condition    Vitamin D  deficiency    Past Surgical History:  Procedure Laterality Date   APPENDECTOMY     BACK SURGERY  03/12/2010   L5   BREAST SURGERY     BIOPSY   HERNIA REPAIR     groin bilateral   NECK SURGERY     PELVIC LAPAROSCOPY  8009,8006   PILONIDAL CYST EXCISION     SALPINGOOPHORECTOMY  10/11/2002   SCOPE LSO-LEFT SEROUS CYSTADENOMA   SALPINGOOPHORECTOMY  04/12/2005   RSO/TOA   SHOULDER SURGERY Right 01/2018   TONSILLECTOMY AND ADENOIDECTOMY     TUBAL LIGATION     VAGINAL HYSTERECTOMY  01/10/2005   TVH, POSTERIOR REPAIR   Patient Active Problem List   Diagnosis Date Noted   Dry mouth 06/24/2022   Joint swelling 06/24/2022   Other specified abnormal immunological findings in serum 06/24/2022   Pain in limb 06/24/2022   Insulin  resistance 05/19/2022   Low mean corpuscular hemoglobin concentration (MCHC) 05/19/2022   Other fatigue 05/05/2022   SOBOE (shortness of breath on  exertion) 05/05/2022   Right foot pain 05/05/2022   Depression screen 05/05/2022   Arthritis 03/23/2022   Hyperlipidemia associated with type 2 diabetes mellitus (HCC) 12/24/2021   Essential hypertension 10/21/2021   Type 2 diabetes mellitus with hypoglycemia without coma, without long-term current use of insulin  (HCC) 10/21/2021   Primary osteoarthritis involving multiple joints 10/21/2021   Mild intermittent asthma without complication 10/21/2021   History of herpes genitalis 10/21/2021   History of gout 10/21/2021   Gastroesophageal reflux disease without esophagitis 10/21/2021   Persistent cough 04/20/2021   Chronic vertigo 12/04/2020   COVID-19 04/16/2020   Neuropathic pain 04/12/2020   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   Gout 12/19/2019   Carotid artery stenosis 11/24/2019   Multiple joint pain 11/24/2019   Allergy to food 05/05/2018   Non-toxic nodular goiter 08/31/2017   Reflux    Asthma    Sjogren syndrome with inflammatory arthritis (HCC)     PCP: Vicci Barnie NOVAK, MD   REFERRING PROVIDER: Vicci Barnie NOVAK, MD   REFERRING DIAG: M54.16 (ICD-10-CM) - Lumbar radiculopathy   Rationale for Evaluation and Treatment: Rehabilitation  THERAPY DIAG:  Chronic  bilateral low back pain with bilateral sciatica - Plan: PT plan of care cert/re-cert  Muscle weakness (generalized) - Plan: PT plan of care cert/re-cert  Difficulty in walking, not elsewhere classified - Plan: PT plan of care cert/re-cert  ONSET DATE: Aug 20, 2023  SUBJECTIVE:                                                                                                                                                                                           SUBJECTIVE STATEMENT: I lifted my grandson when I picked him up (28 lb) I felt like I hurt my back and I am going on week 11 of my pain since that incident. I went to pelvic floor therapist , cheryl Elnor. And it was helpful. But now I know the  exercises.  Pt has had several previous surgeries and admits she has not had a routine exercise for a while although she used to enjoy walking at [facility].  She is having difficulty performing household chores like washing dishes and vacuuming.  She would like to be able to exercise and reduce pain for more comfortable living and return to more active lifestyle  PERTINENT HISTORY:  DOS: 10/07/2022 Rt  triple arthrodesis (subtalar joint, the talonavicular joint, and the calcaneocuboid joint) gastrocnemius recession peroneal tendon lengthening with tibial bone marrow aspirate. Lumbar surgery in 2010 L5,  cervical surgery 2002 C 6/7 fusion, Right shoulder arthroscopy RTC 2019, DM HTN, asthma, OA, Sjogren's syndrome, RA,   PAIN:  Are you having pain? Yes: NPRS scale: at rest 5/10 and at worst 10/10 Pain location: low back bil radiating to both feet to big toes bil Pain description: numbness and tingling in Right toe. Pain in left toe Aggravating factors: Transition movements , getting in and out of bed, stepping into SUV, getting up to walking and holding on, standing for longer than 5 minutes. Can't wash dishes and mopping, vacuuming, stairs in dtr house, walking no more than 10 minutes Relieving factors: takes tylenol, heat  PRECAUTIONS: None  RED FLAGS: None   WEIGHT BEARING RESTRICTIONS: No  FALLS:  Has patient fallen in last 6 months? No  LIVING ENVIRONMENT: Lives with: lives with their family and lives alone Lives in: House/apartment Stairs: No Has following equipment at home: Single point cane  OCCUPATION: retired Child psychotherapist  PLOF: Independent  PATIENT GOALS: Be able to pick up my grandson and to get back to walking for exercise  NEXT MD VISIT: TBD  OBJECTIVE:  Note: Objective measures were completed at Evaluation unless otherwise noted.  DIAGNOSTIC FINDINGS:  Pt completed but not read yet. Please see medical records  PATIENT SURVEYS:  Modified Oswestry:  MODIFIED  OSWESTRY DISABILITY SCALE  Date: 11-01-23 Score  Pain intensity 1 = The pain is bad, but I can manage without having to take (1) I can stand as long as I want but, it increases my pain. pain medication.  2. Personal care (washing, dressing, etc.) 0 =  I can take care of myself normally without causing increased pain.  3. Lifting 4 = I can lift only very light weights  4. Walking 1 = Pain prevents me from walking more than 1 mile.  5. Sitting 2 =  Pain prevents me from sitting more than 1 hour.  6. Standing 3 =  Pain prevents me from standing more than 1/2 hour.  7. Sleeping 3 =  Even when I take pain medication, I sleep less than 4 hours.  8. Social Life 2 = Pain prevents me from participating in more energetic activities (eg. sports, dancing).  9. Traveling 1 =  I can travel anywhere, but it increases my pain.  10. Employment/ Homemaking 2 = I can perform most of my homemaking/job duties, but pain prevents me from performing more physically stressful activities (eg, lifting, vacuuming).  Total 19/50  38%   Interpretation of scores: Score Category Description  0-20% Minimal Disability The patient can cope with most living activities. Usually no treatment is indicated apart from advice on lifting, sitting and exercise  21-40% Moderate Disability The patient experiences more pain and difficulty with sitting, lifting and standing. Travel and social life are more difficult and they may be disabled from work. Personal care, sexual activity and sleeping are not grossly affected, and the patient can usually be managed by conservative means  41-60% Severe Disability Pain remains the main problem in this group, but activities of daily living are affected. These patients require a detailed investigation  61-80% Crippled Back pain impinges on all aspects of the patient's life. Positive intervention is required  81-100% Bed-bound  These patients are either bed-bound or exaggerating their symptoms  Bluford FORBES Zoe DELENA Karon DELENA, et al. Surgery versus conservative management of stable thoracolumbar fracture: the PRESTO feasibility RCT. [place] ([place]): [employer]; 2021 Nov. [facility] Technology Assessment, No. 25.62.) Appendix 3, Oswestry Disability Index category descriptors. Available from: FindJewelers.cz  Minimally Clinically Important Difference (MCID) = 12.8%  COGNITION: Overall cognitive status: Within functional limits for tasks assessed     SENSATION: Numbness in bil LE with pain down to bil Great toes  R > L  MUSCLE LENGTH: Hamstrings: bil tighteness about 60 degrees Thomas test:  Positive Right 20 degrees from horizontal and Left 20 degrees from horizontal   POSTURE: rounded shoulders, forward head, weight shift left, and scoliosis and obesity  PALPATION: TTP over bil SI joints r > L, also TTP over L 5, s1  LUMBAR ROM:   AROM eval  Flexion Fingertips to ankle bil *  Extension 50 % *  Right lateral flexion 25 %  Left lateral flexion 25%  Right rotation 50%  Left rotation 50%   Key: WFL = within functional limits not formally assessed, * = concordant pain, s = stiffness/stretching sensation, NT = not tested)   (Blank rows = not tested, score listed is out of 5 possible points.  N = WNL, D = diminished, C = clear for gross weakness with myotome testing, * = concordant pain with testing)   LOWER EXTREMITY ROM:     Active  Right eval Left eval  Hip flexion  Hip extension    Hip abduction    Hip adduction    Hip internal rotation 23 30  Hip external rotation 45 49  Knee flexion 120 120  Knee extension    Ankle dorsiflexion 6 9  Ankle plantarflexion    Ankle inversion    Ankle eversion     Key: WFL = within functional limits not formally assessed, * = concordant pain, s = stiffness/stretching sensation, NT = not tested)    LOWER EXTREMITY MMT:    MMT Right eval Left eval  Hip flexion 4- 4  Hip extension 4- 4   Hip abduction 3+ 4-  Hip adduction    Hip internal rotation    Hip external rotation    Knee flexion 4 4+  Knee extension 4 4+  Ankle dorsiflexion 4- 4  Ankle plantarflexion    Ankle inversion    Ankle eversion    Blank rows = not tested, score listed is out of 5 possible points.  N = WNL, D = diminished, C = clear for gross weakness with myotome testing, * = concordant pain with testing)   LUMBAR SPECIAL TESTS:   SLR test R +  SLump test negative  bil SI distraction and compression + bil   FABER - negative FADIR - Negative  FUNCTIONAL TESTS:  5 times sit to stand: 21.92 sec 2 minute walk test: 309.3 ft ( norm 509 ft)  GAIT: Distance walked: 309.3 ft ( norm 509 ft) Assistive device utilized: None Level of assistance: Complete Independence Comments: Pt with wide based gait and antalgic more on R than left  Trendelenberg Rgith  TREATMENT DATE: EVAL and issue HEP and explanation of condition management                                                                                                                                 PATIENT EDUCATION:  Education details: POC Explanation of findings, issue HEP, condition managment Person educated: Patient Education method: Explanation, Demonstration, Tactile cues, Verbal cues, and Handouts Education comprehension: verbalized understanding, returned demonstration, verbal cues required, tactile cues required, and needs further education  HOME EXERCISE PROGRAM: Access Code: [email or web address] URL: [email or web address] Date: 11/01/2023 Prepared by: Graydon Dingwall  Exercises - Supine Pelvic Tilt  - 1 x daily - 7 x weekly - 3 sets - 10 reps - Supine Single Knee to Chest Stretch  - 2 x daily - 7 x weekly - 1 sets - 5 reps - 10 hold - Supine Lower Trunk Rotation  - 2 x daily - 7 x weekly - 1 sets - 5 reps - 20 hold - Supine Bridge  - 1 x daily - 7 x weekly - 2 sets - 10 reps - Seated Hamstring Stretch  - 1 x daily - 7 x  weekly - 1 sets - 2 reps - 60 sec hold - Hip flexor stretch at edge of bed with SLR resisted  -  2 x daily - 7 x weekly - 1 sets - 1-2 reps - 60 hold  ASSESSMENT:  CLINICAL IMPRESSION: Patient is a 68 y.o. female who was seen today for physical therapy evaluation and treatment for bil low back pain with bil radiculopathy into bil Great toes with numbness in  R> L. Pt also with TTP over bil SI with R > L.  Luvia notes  increased LBP and constant bil  radicular pain  following injury after lifting 28 lb grandson May 2025.  Pt presents with signs and symptoms compatible with lumbar radiculopathy. Pt also has scoliosis. Pt would benefit from skilled PT for 1-2 times a week for 8 weeks to address above impariments and functional limitations and return to pain-free PLOF.   OBJECTIVE IMPAIRMENTS: decreased activity tolerance, decreased balance, decreased mobility, difficulty walking, decreased ROM, decreased strength, impaired sensation, improper body mechanics, postural dysfunction, obesity, and pain.   ACTIVITY LIMITATIONS: carrying, lifting, bending, standing, squatting, stairs, transfers, locomotion level, and caring for others  PARTICIPATION LIMITATIONS: meal prep, cleaning, laundry, driving, community activity, church, and caring for grandchild  PERSONAL FACTORS: DOS: 10/07/2022 Rt  triple arthrodesis (subtalar joint, the talonavicular joint, and the calcaneocuboid joint) gastrocnemius recession peroneal tendon lengthening with tibial bone marrow aspirate. Lumbar surgery in 2010 L5,  cervical surgery 2002 C 6/7 fusion, Right shoulder arthroscopy RTC 2019, DM HTN, asthma, OA, Sjogren's syndrome, RA,  are also affecting patient's functional outcome.   REHAB POTENTIAL: Good  CLINICAL DECISION MAKING: Evolving/moderate complexity  EVALUATION COMPLEXITY: Moderate   GOALS: Goals reviewed with patient? Yes  SHORT TERM GOALS: Target date: 11-29-23  Independent with initial HEP Baseline:no  knowledge Goal status: INITIAL  2.  Sit and stand with RT=LT wt bearing to reduce lumbar strain and allow for increased tolerance for these positions for home tasks Baseline: wt shift to left Goal status: INITIAL  3.  Demonstrate and verbalize techniques to reduce the risk of re-injury including: lifting, posture, body mechanics.  Baseline: no knowledge Goal status: INITIAL  4.  Pt will perform 5xSTS in <19.0 sec sec in order to demonstrate reduced fall risk and improved functional independence. (MCID of 2.3sec) Baseline: 21.92 Goal status: INITIAL    LONG TERM GOALS: Target date: 12-27-23  Pt will be independent with advanced HEP.  Baseline: no knowlege Goal status: INITIAL  2.  Carollyn will be able to carry grandson with good body mechanics without exacerbating pain. Amedeo is 30lb) Baseline: Pt injured herself picking up grandson in May 2025 Goal status: INITIAL  3.  Walk for 30 minutes with minimal pain in order to develop healthy habit Baseline: No routine exercise due to pain Goal status: INITIAL  4.  Pt. will show a >/= 12 pt improvement in their ODI score (MCID is 12 pts) as a proxy for functional improvement Baseline: 19/50  38 % Goal status: INITIAL  5.  Pt.  will improve two minute walk test to 35feet with =< SPC (MCID 40 ft). Baseline: 309.3 ft  Goal status: INITIAL  6.  Pt. Will be able to stand for 25 minutes with  pain no greater than 3/10 in order to complete household chores.   Baseline: Pt is not doing any household chores presently since injury Goal status: INITIAL  PLAN:  PT FREQUENCY: 1-2x/week  PT DURATION: 8 weeks  PLANNED INTERVENTIONS: 97164- PT Re-evaluation, 97750- Physical Performance Testing, 97110-Therapeutic exercises, 97530- Therapeutic activity, [id number]- Neuromuscular re-education, 97535- Self Care, [id number]- Manual therapy, [id number]- Gait training, [id number]- Aquatic Therapy,  02967- Electrical stimulation (manual), 79439 (1-2 muscles), 20561  (3+ muscles)- Dry Needling, Patient/Family education, Balance training, Stair training, Taping, Joint mobilization, Spinal mobilization, Cryotherapy, and Moist heat.  PLAN FOR NEXT SESSION: Progress HEP, Pharmacist, community training for caring for grandchildren. Check LLD and SI levels   Graydon Dingwall, PT, [facility] Certified Exercise Expert for the Aging Adult  11/01/23 4:27 PM Phone: [phone] Fax: [phone]   Date of referral: 10-04-23 Referring provider: Vicci Barnie NOVAK, MD  Referring diagnosis? M54.16 (ICD-10-CM) - Lumbar radiculopathy  Treatment diagnosis? (if different than referring diagnosis) muscle weakness, and difficulty walking,   What was this (referring dx) caused by? Arthritis and Other: lifting injury(of grandchild  Lysle of Condition: Initial Onset (within last 3 months)   Laterality: Both  Current Functional Measure Score: Other ODI  19/50  38%  Objective measurements identify impairments when they are compared to normal values, the uninvolved extremity, and prior level of function.  [x]  Yes  []  No  Objective assessment of functional ability: Moderate functional limitations   Briefly describe symptoms: bil back pain with radiating pain to Great toes bil R > L  How did symptoms start: lifting 28 lb grandchild 11 weeks ago  Average pain intensity:  Last 24 hours: 7/10  Past week: 7/10 to 10/10  How often does the pt experience symptoms? Constantly  How much have the symptoms interfered with usual daily activities? A little bit  How has condition changed since care began at this facility? NA - initial visit  In general, how is the patients overall health? Good   BACK PAIN (STarT Back Screening Tool) Has pain spread down the leg(s) at some time in the last 2 weeks? Yes bil LE's Has there been pain in the shoulder or neck at some time in the last 2 weeks? Yes with rain and OA past surgery in neck Has the pt only walked short distances because  of back pain? Yes Has patient dressed more slowly because of back pain in the past 2 weeks? Yes Does patient think it's not safe for a person with this condition to be physically active? yes Does patient have worrying thoughts a lot of the time? yes Does patient feel back pain is terrible and will never get any better? no Has patient stopped enjoying things they usually enjoy? Yes because I can't dance and run or walk in the park

## 2023-10-31 ENCOUNTER — Ambulatory Visit
Admission: RE | Admit: 2023-10-31 | Discharge: 2023-10-31 | Disposition: A | Discharge status: Home / Self Care | Source: Ambulatory Visit | Attending: Internal Medicine | Admitting: Internal Medicine

## 2023-10-31 DIAGNOSIS — M48061 Spinal stenosis, lumbar region without neurogenic claudication: Secondary | ICD-10-CM | POA: Diagnosis not present

## 2023-10-31 DIAGNOSIS — M4186 Other forms of scoliosis, lumbar region: Secondary | ICD-10-CM | POA: Diagnosis not present

## 2023-10-31 DIAGNOSIS — M4317 Spondylolisthesis, lumbosacral region: Secondary | ICD-10-CM | POA: Diagnosis not present

## 2023-10-31 DIAGNOSIS — M5416 Radiculopathy, lumbar region: Secondary | ICD-10-CM

## 2023-11-01 ENCOUNTER — Ambulatory Visit: Attending: Internal Medicine | Admitting: Physical Therapy

## 2023-11-01 ENCOUNTER — Encounter: Payer: Self-pay | Admitting: Physical Therapy

## 2023-11-01 DIAGNOSIS — G8929 Other chronic pain: Secondary | ICD-10-CM | POA: Diagnosis not present

## 2023-11-01 DIAGNOSIS — M5441 Lumbago with sciatica, right side: Secondary | ICD-10-CM | POA: Insufficient documentation

## 2023-11-01 DIAGNOSIS — R262 Difficulty in walking, not elsewhere classified: Secondary | ICD-10-CM | POA: Insufficient documentation

## 2023-11-01 DIAGNOSIS — M5442 Lumbago with sciatica, left side: Secondary | ICD-10-CM | POA: Insufficient documentation

## 2023-11-01 DIAGNOSIS — M5416 Radiculopathy, lumbar region: Secondary | ICD-10-CM | POA: Diagnosis not present

## 2023-11-01 DIAGNOSIS — M6281 Muscle weakness (generalized): Secondary | ICD-10-CM | POA: Diagnosis not present

## 2023-11-04 DIAGNOSIS — M5416 Radiculopathy, lumbar region: Secondary | ICD-10-CM | POA: Diagnosis not present

## 2023-11-08 DIAGNOSIS — M0609 Rheumatoid arthritis without rheumatoid factor, multiple sites: Secondary | ICD-10-CM | POA: Diagnosis not present

## 2023-11-08 NOTE — Therapy (Signed)
 OUTPATIENT PHYSICAL THERAPY THORACOLUMBAR TREATMENT   Patient Name: [name] MRN: [id number] DOB:October 08, 1955, 68 y.o., female Today's Date: 11/09/2023  END OF SESSION:  PT End of Session - 11/09/23 1015     Visit Number 2    Number of Visits 16    Date for PT Re-Evaluation 12/27/23    Authorization Type [employer] MCR    PT Start Time 1015    PT Stop Time 1100    PT Time Calculation (min) 45 min    Activity Tolerance Patient tolerated treatment well;Patient limited by pain    Behavior During Therapy [facility] for tasks assessed/performed           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   HTN (hypertension)    Joint pain    Lactose intolerance    Multiple food allergies    OA (osteoarthritis)    Reflux    Sjogren's syndrome (HCC)    SOB (shortness of breath)    Swallowing difficulty    Thyroid  condition    Vitamin D  deficiency    Past Surgical History:  Procedure Laterality Date   APPENDECTOMY     BACK SURGERY  03/12/2010   L5   BREAST SURGERY     BIOPSY   HERNIA REPAIR     groin bilateral   NECK SURGERY     PELVIC LAPAROSCOPY  8009,8006   PILONIDAL CYST EXCISION     SALPINGOOPHORECTOMY  10/11/2002   SCOPE LSO-LEFT SEROUS CYSTADENOMA   SALPINGOOPHORECTOMY  04/12/2005   RSO/TOA   SHOULDER SURGERY Right 01/2018   TONSILLECTOMY AND ADENOIDECTOMY     TUBAL LIGATION     VAGINAL HYSTERECTOMY  01/10/2005   TVH, POSTERIOR REPAIR   Patient Active Problem List   Diagnosis Date Noted   Dry mouth 06/24/2022   Joint swelling 06/24/2022   Other specified abnormal immunological findings in serum 06/24/2022   Pain in limb 06/24/2022   Insulin  resistance 05/19/2022   Low mean corpuscular hemoglobin concentration (MCHC) 05/19/2022   Other fatigue 05/05/2022   SOBOE (shortness of breath on  exertion) 05/05/2022   Right foot pain 05/05/2022   Depression screen 05/05/2022   Arthritis 03/23/2022   Hyperlipidemia associated with type 2 diabetes mellitus (HCC) 12/24/2021   Essential hypertension 10/21/2021   Type 2 diabetes mellitus with hypoglycemia without coma, without long-term current use of insulin  (HCC) 10/21/2021   Primary osteoarthritis involving multiple joints 10/21/2021   Mild intermittent asthma without complication 10/21/2021   History of herpes genitalis 10/21/2021   History of gout 10/21/2021   Gastroesophageal reflux disease without esophagitis 10/21/2021   Persistent cough 04/20/2021   Chronic vertigo 12/04/2020   COVID-19 04/16/2020   Neuropathic pain 04/12/2020   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   Gout 12/19/2019   Carotid artery stenosis 11/24/2019   Multiple joint pain 11/24/2019   Allergy to food 05/05/2018   Non-toxic nodular goiter 08/31/2017   Reflux    Asthma    Sjogren syndrome with inflammatory arthritis (HCC)     PCP: Vicci Barnie NOVAK, MD   REFERRING PROVIDER: Vicci Barnie NOVAK, MD   REFERRING DIAG: M54.16 (ICD-10-CM) - Lumbar radiculopathy   Rationale for Evaluation and Treatment: Rehabilitation  THERAPY DIAG:  Chronic bilateral low back pain with bilateral sciatica  Muscle weakness (generalized)  Difficulty in walking, not elsewhere classified  ONSET DATE: Aug 20, 2023  SUBJECTIVE:                                                                                                                                                                                           SUBJECTIVE STATEMENT: I am needing to get scheduled for injections for back and hip. I am waiting for the MD.  Pain today is 5-6/10 today.  EVAL I lifted my grandson when I picked him up (28 lb) I felt like I hurt my back and I am going on week 11 of my pain since that incident. I went to pelvic floor therapist , cheryl Elnor. And it was helpful. But now I  know the exercises.  Pt has had several previous surgeries and admits she has not had a routine exercise for a while although she used to enjoy walking at [place].  She is having difficulty performing household chores like washing dishes and vacuuming.  She would like to be able to exercise and reduce pain for more comfortable living and return to more active lifestyle  PERTINENT HISTORY:  DOS: 10/07/2022 Rt  triple arthrodesis (subtalar joint, the talonavicular joint, and the calcaneocuboid joint) gastrocnemius recession peroneal tendon lengthening with tibial bone marrow aspirate. Lumbar surgery in 2010 L5,  cervical surgery 2002 C 6/7 fusion, Right shoulder arthroscopy RTC 2019, DM HTN, asthma, OA, Sjogren's syndrome, RA,   PAIN:  Are you having pain? Yes: NPRS scale: at rest 5/10 and at worst 10/10 Pain location: low back bil radiating to both feet to big toes bil Pain description: numbness and tingling in Right toe. Pain in left toe Aggravating factors: Transition movements , getting in and out of bed, stepping into SUV, getting up to walking and holding on, standing for longer than 5 minutes. Can't wash dishes and mopping, vacuuming, stairs in dtr house, walking no more than 10 minutes Relieving factors: takes tylenol, heat  PRECAUTIONS: None  RED FLAGS: None   WEIGHT BEARING RESTRICTIONS: No  FALLS:  Has patient fallen in last 6 months? No  LIVING ENVIRONMENT: Lives with: lives with their family and lives alone Lives in: House/apartment Stairs: No Has following equipment at home: Single point cane  OCCUPATION: retired Child psychotherapist  PLOF: Independent  PATIENT GOALS: Be able to pick up my grandson and to get back to walking for exercise  NEXT MD VISIT: TBD  OBJECTIVE:  Note: Objective measures were completed at Evaluation unless otherwise noted.  DIAGNOSTIC FINDINGS:  Pt completed but not  read yet. Please see medical records  PATIENT SURVEYS:  Modified Oswestry:   MODIFIED OSWESTRY DISABILITY SCALE  Date: 11-01-23 Score  Pain intensity 1 = The pain is bad, but I can manage without having to take (1) I can stand as long as I want but, it increases my pain. pain medication.  2. Personal care (washing, dressing, etc.) 0 =  I can take care of myself normally without causing increased pain.  3. Lifting 4 = I can lift only very light weights  4. Walking 1 = Pain prevents me from walking more than 1 mile.  5. Sitting 2 =  Pain prevents me from sitting more than 1 hour.  6. Standing 3 =  Pain prevents me from standing more than 1/2 hour.  7. Sleeping 3 =  Even when I take pain medication, I sleep less than 4 hours.  8. Social Life 2 = Pain prevents me from participating in more energetic activities (eg. sports, dancing).  9. Traveling 1 =  I can travel anywhere, but it increases my pain.  10. Employment/ Homemaking 2 = I can perform most of my homemaking/job duties, but pain prevents me from performing more physically stressful activities (eg, lifting, vacuuming).  Total 19/50  38%   Interpretation of scores: Score Category Description  0-20% Minimal Disability The patient can cope with most living activities. Usually no treatment is indicated apart from advice on lifting, sitting and exercise  21-40% Moderate Disability The patient experiences more pain and difficulty with sitting, lifting and standing. Travel and social life are more difficult and they may be disabled from work. Personal care, sexual activity and sleeping are not grossly affected, and the patient can usually be managed by conservative means  41-60% Severe Disability Pain remains the main problem in this group, but activities of daily living are affected. These patients require a detailed investigation  61-80% Crippled Back pain impinges on all aspects of the patient's life. Positive intervention is required  81-100% Bed-bound  These patients are either bed-bound or exaggerating their symptoms   Bluford FORBES Zoe DELENA Karon DELENA, et al. Surgery versus conservative management of stable thoracolumbar fracture: the PRESTO feasibility RCT. [place]): [employer]; 2021 Nov. [facility] Technology Assessment, No. 25.62.) Appendix 3, Oswestry Disability Index category descriptors. Available from: FindJewel[employer].cz  Minimally Clinically Important Difference (MCID) = 12.8%  COGNITION: Overall cognitive status: Within functional limits for tasks assessed     SENSATION: Numbness in bil LE with pain down to bil Great toes  R > L  MUSCLE LENGTH: Hamstrings: bil tighteness about 60 degrees Thomas test:  Positive Right 20 degrees from horizontal and Left 20 degrees from horizontal   POSTURE: rounded shoulders, forward head, weight shift left, and scoliosis and obesity  PALPATION: TTP over bil SI joints r > L, also TTP over L 5, s1  LUMBAR ROM:   AROM eval  Flexion Fingertips to ankle bil *  Extension 50 % *  Right lateral flexion 25 %  Left lateral flexion 25%  Right rotation 50%  Left rotation 50%   Key: WFL = within functional limits not formally assessed, * = concordant pain, s = stiffness/stretching sensation, NT = not tested)   (Blank rows = not tested, score listed is out of 5 possible points.  N = WNL, D = diminished, C = clear for gross weakness with myotome testing, * = concordant pain with testing)   LOWER EXTREMITY ROM:     Active  Right  eval Left eval 11-09-23 R/L  Hip flexion     Hip extension     Hip abduction     Hip adduction     Hip internal rotation 23 30   Hip external rotation 45 49   Knee flexion 120 120  Left  prone 100 Right prone 112  Knee extension     Ankle dorsiflexion 6 9   Ankle plantarflexion     Ankle inversion     Ankle eversion      Key: WFL = within functional limits not formally assessed, * = concordant pain, s = stiffness/stretching sensation, NT = not tested)    LOWER EXTREMITY MMT:     MMT Right eval Left eval  Hip flexion 4- 4  Hip extension 4- 4  Hip abduction 3+ 4-  Hip adduction    Hip internal rotation    Hip external rotation    Knee flexion 4 4+  Knee extension 4 4+  Ankle dorsiflexion 4- 4  Ankle plantarflexion    Ankle inversion    Ankle eversion    Blank rows = not tested, score listed is out of 5 possible points.  N = WNL, D = diminished, C = clear for gross weakness with myotome testing, * = concordant pain with testing)   LUMBAR SPECIAL TESTS:   SLR test R +  SLump test negative  bil SI distraction and compression + bil   FABER - negative FADIR - Negative  FUNCTIONAL TESTS:  5 times sit to stand: 21.92 sec 2 minute walk test: 309.3 ft ( norm 509 ft)  GAIT: Distance walked: 309.3 ft ( norm 509 ft) Assistive device utilized: None Level of assistance: Complete Independence Comments: Pt with wide based gait and antalgic more on R than left  Trendelenberg Rgith  TREATMENT DATE: [facility] Adult PT Treatment:                                                DATE: 11-09-23 Therapeutic Exercise: Supine Pelvic Tilt  10 x 5 sec hold SKTC 5 x 5-10 sec hold LTR  3 reps each side- 20 hold Supine Bridge  2 x 10 Supine bridge on pball 4 x but with neuro signs of ankle thigh and calf with cramping so DC Hip flexor stretch at edge of bed with SLR resisted  Seated Hamstring Stretch   VC and TC Quad stretch prone bil Manual Therapy: STW of Vastus Lateralis and rectus IASTYM of Vastus lateralis and rectus on Left LE Contract relax of Left quad muscle   Therapeutic Activity: NuStep UE/LE and 6 min level 4 while taking subjective STS with15 # KB   4 x only Self Care: Posture sitting and standing Body mechanics and household posture     EVAL and issue HEP and explanation of condition management  PATIENT EDUCATION:  Education  details: POC Explanation of findings, issue HEP, condition managment Person educated: Patient Education method: Explanation, Demonstration, Tactile cues, Verbal cues, and Handouts Education comprehension: verbalized understanding, returned demonstration, verbal cues required, tactile cues required, and needs further education  HOME EXERCISE PROGRAM: Access Code: [email or web address] URL: [email or web address] Date: 11/01/2023 Prepared by: Graydon Dingwall  Exercises - Supine Pelvic Tilt  - 1 x daily - 7 x weekly - 3 sets - 10 reps - Supine Single Knee to Chest Stretch  - 2 x daily - 7 x weekly - 1 sets - 5 reps - 10 hold - Supine Lower Trunk Rotation  - 2 x daily - 7 x weekly - 1 sets - 5 reps - 20 hold - Supine Bridge  - 1 x daily - 7 x weekly - 2 sets - 10 reps - Seated Hamstring Stretch  - 1 x daily - 7 x weekly - 1 sets - 2 reps - 60 sec hold - Hip flexor stretch at edge of bed with SLR resisted  - 2 x daily - 7 x weekly - 1 sets - 1-2 reps - 60 hold Added 11-08-33 - Supine Figure 4 Piriformis Stretch  - 1 x daily - 7 x weekly - 1 sets - 2-3 reps - 30 sec hold - Sit to stand with sink support Movement snack  - 1 x daily - 7 x weekly - 3 sets - 10 reps - Prone Quadriceps Stretch with Strap  - 1 x daily - 7 x weekly - 1 sets - 2 reps - 60 sec hold ASSESSMENT:  CLINICAL IMPRESSION: Ms. Kinch returns for 2nd visit and explains she is planning to get injections for hips and back when MD schedules.  Pt today educated on body mechanics and postures for sleeping, standing and sitting.  HEP updated today and modifications for hip flexor stretching including adding quad stretching for tight left quad.  Will continue to progress as pt is able. TPDN next visit over painful areas of hip next visit    EVAL- Patient is a 68 y.o. female who was seen today for physical therapy evaluation and treatment for bil low back pain with bil radiculopathy into bil Great toes with numbness in  R> L. Pt also  with TTP over bil SI with R > L.  Reyah notes  increased LBP and constant bil  radicular pain  following injury after lifting 28 lb grandson May 2025.  Pt presents with signs and symptoms compatible with lumbar radiculopathy. Pt also has scoliosis. Pt would benefit from skilled PT for 1-2 times a week for 8 weeks to address above impariments and functional limitations and return to pain-free PLOF.   OBJECTIVE IMPAIRMENTS: decreased activity tolerance, decreased balance, decreased mobility, difficulty walking, decreased ROM, decreased strength, impaired sensation, improper body mechanics, postural dysfunction, obesity, and pain.   ACTIVITY LIMITATIONS: carrying, lifting, bending, standing, squatting, stairs, transfers, locomotion level, and caring for others  PARTICIPATION LIMITATIONS: meal prep, cleaning, laundry, driving, community activity, church, and caring for grandchild  PERSONAL FACTORS: DOS: 10/07/2022 Rt  triple arthrodesis (subtalar joint, the talonavicular joint, and the calcaneocuboid joint) gastrocnemius recession peroneal tendon lengthening with tibial bone marrow aspirate. Lumbar surgery in 2010 L5,  cervical surgery 2002 C 6/7 fusion, Right shoulder arthroscopy RTC 2019, DM HTN, asthma, OA, Sjogren's syndrome, RA,  are also affecting patient's functional outcome.   REHAB POTENTIAL: Good  CLINICAL DECISION MAKING: Evolving/moderate complexity  EVALUATION COMPLEXITY: Moderate   GOALS: Goals reviewed  with patient? Yes  SHORT TERM GOALS: Target date: 11-29-23  Independent with initial HEP Baseline:no knowledge Goal status: INITIAL  2.  Sit and stand with RT=LT wt bearing to reduce lumbar strain and allow for increased tolerance for these positions for home tasks Baseline: wt shift to left Goal status: INITIAL  3.  Demonstrate and verbalize techniques to reduce the risk of re-injury including: lifting, posture, body mechanics.  Baseline: no knowledge Goal status:  INITIAL  4.  Pt will perform 5xSTS in <19.0 sec sec in order to demonstrate reduced fall risk and improved functional independence. (MCID of 2.3sec) Baseline: 21.92 Goal status: INITIAL    LONG TERM GOALS: Target date: 12-27-23  Pt will be independent with advanced HEP.  Baseline: no knowlege Goal status: INITIAL  2.  Kenady will be able to carry grandson with good body mechanics without exacerbating pain. Amedeo is 30lb) Baseline: Pt injured herself picking up grandson in May 2025 Goal status: INITIAL  3.  Walk for 30 minutes with minimal pain in order to develop healthy habit Baseline: No routine exercise due to pain Goal status: INITIAL  4.  Pt. will show a >/= 12 pt improvement in their ODI score (MCID is 12 pts) as a proxy for functional improvement Baseline: 19/50  38 % Goal status: INITIAL  5.  Pt.  will improve two minute walk test to 334feet with =< SPC (MCID 40 ft). Baseline: 309.3 ft  Goal status: INITIAL  6.  Pt. Will be able to stand for 25 minutes with  pain no greater than 3/10 in order to complete household chores.   Baseline: Pt is not doing any household chores presently since injury Goal status: INITIAL  PLAN:  PT FREQUENCY: 1-2x/week  PT DURATION: 8 weeks  PLANNED INTERVENTIONS: 97164- PT Re-evaluation, 97750- Physical Performance Testing, 97110-Therapeutic exercises, 97530- Therapeutic activity, [id number]- Neuromuscular re-education, 97535- Self Care, [id number]- Manual therapy, [id number]- Gait training, [phone]- Aquatic Therapy, [phone]- Electrical stimulation (manual), [phone] (1-2 muscles), 20561 (3+ muscles)- Dry Needling, Patient/Family education, Balance training, Stair training, Taping, Joint mobilization, Spinal mobilization, Cryotherapy, and Moist heat.  PLAN FOR NEXT SESSION: Progress HEP, Pharmacist, community training for caring for grandchildren. Check LLD and SI levels   Graydon Dingwall, PT, [facility] Certified Exercise Expert for the Aging Adult  11/09/23  12:09 PM Phone: [phone] Fax: [phone]   Date of referral: 10-04-23 Referring provider: Vicci Barnie NOVAK, MD  Referring diagnosis? M54.16 (ICD-10-CM) - Lumbar radiculopathy  Treatment diagnosis? (if different than referring diagnosis) muscle weakness, and difficulty walking,   What was this (referring dx) caused by? Arthritis and Other: lifting injury(of grandchild  Lysle of Condition: Initial Onset (within last 3 months)   Laterality: Both  Current Functional Measure Score: Other ODI  19/50  38%  Objective measurements identify impairments when they are compared to normal values, the uninvolved extremity, and prior level of function.  [x]  Yes  []  No  Objective assessment of functional ability: Moderate functional limitations   Briefly describe symptoms: bil back pain with radiating pain to Great toes bil R > L  How did symptoms start: lifting 28 lb grandchild 11 weeks ago  Average pain intensity:  Last 24 hours: 7/10  Past week: 7/10 to 10/10  How often does the pt experience symptoms? Constantly  How much have the symptoms interfered with usual daily activities? A little bit  How has condition changed since care began at this facility? NA - initial visit  In general, how is the patients  overall health? Good   BACK PAIN (STarT Back Screening Tool) Has pain spread down the leg(s) at some time in the last 2 weeks? Yes bil LE's Has there been pain in the shoulder or neck at some time in the last 2 weeks? Yes with rain and OA past surgery in neck Has the pt only walked short distances because of back pain? Yes Has patient dressed more slowly because of back pain in the past 2 weeks? Yes Does patient think it's not safe for a person with this condition to be physically active? yes Does patient have worrying thoughts a lot of the time? yes Does patient feel back pain is terrible and will never get any better? no Has patient stopped enjoying things they usually  enjoy? Yes because I can't dance and run or walk in the park

## 2023-11-09 ENCOUNTER — Ambulatory Visit: Admitting: Physical Therapy

## 2023-11-09 ENCOUNTER — Encounter: Payer: Self-pay | Admitting: Physical Therapy

## 2023-11-09 DIAGNOSIS — M5442 Lumbago with sciatica, left side: Secondary | ICD-10-CM | POA: Diagnosis not present

## 2023-11-09 DIAGNOSIS — M5416 Radiculopathy, lumbar region: Secondary | ICD-10-CM | POA: Diagnosis not present

## 2023-11-09 DIAGNOSIS — R262 Difficulty in walking, not elsewhere classified: Secondary | ICD-10-CM

## 2023-11-09 DIAGNOSIS — G8929 Other chronic pain: Secondary | ICD-10-CM | POA: Diagnosis not present

## 2023-11-09 DIAGNOSIS — M6281 Muscle weakness (generalized): Secondary | ICD-10-CM | POA: Diagnosis not present

## 2023-11-09 DIAGNOSIS — M5441 Lumbago with sciatica, right side: Secondary | ICD-10-CM | POA: Diagnosis not present

## 2023-11-09 NOTE — Telephone Encounter (Signed)
 Phone call placed to patient 11/08/2023 to go over the results of the MRI of the lumbar spine.  Patient informed that the MRI showed moderate spinal stenosis, moderate scoliosis and some degenerative disc.  Patient states that she saw Dr. Hugo PA one week ago and she went over results. They plan to do inj to L4-L5 and to the hip.

## 2023-11-09 NOTE — Patient Instructions (Signed)
 Posture Tips DO: - stand tall and erect - keep chin tucked in - keep head and shoulders in alignment - check posture regularly in mirror or large window - pull head back against headrest in car seat;  Change your position often.  Sit with lumbar support. DON'T: - slouch or slump while watching TV or reading - sit, stand or lie in one position  for too long;  Sitting is especially hard on the spine so if you sit at a desk/use the computer, then stand up often!   Copyright  VHI. All rights reserved.  Posture - Standing   Good posture is important. Avoid slouching and forward head thrust. Maintain curve in low back and align ears over shoul- ders, hips over ankles.  Pull your belly button in toward your back bone.   Copyright  VHI. All rights reserved.  Posture - Sitting   Sit upright, head facing forward. Try using a roll to support lower back. Keep shoulders relaxed, and avoid rounded back. Keep hips level with knees. Avoid crossing legs for long periods.   Copyright  VHI. All rights reserved.   Sleeping on Back  Place pillow under knees. A pillow with cervical support and a roll around waist are also helpful. Copyright  VHI. All rights reserved.  Sleeping on Side Place pillow between knees. Use cervical support under neck and a roll around waist as needed. Copyright  VHI. All rights reserved.   Sleeping on Stomach   If this is the only desirable sleeping position, place pillow under lower legs, and under stomach or chest as needed.  Posture - Sitting   Sit upright, head facing forward. Try using a roll to support lower back. Keep shoulders relaxed, and avoid rounded back. Keep hips level with knees. Avoid crossing legs for long periods. Stand to Sit / Sit to Stand   To sit: Bend knees to lower self onto front edge of chair, then scoot back on seat. To stand: Reverse sequence by placing one foot forward, and scoot to front of seat. Use rocking motion to stand up.   Work  Height and Reach  Ideal work height is no more than 2 to 4 inches below elbow level when standing, and at elbow level when sitting. Reaching should be limited to arm's length, with elbows slightly bent.  Bending  Bend at hips and knees, not back. Keep feet shoulder-width apart.    Posture - Standing   Good posture is important. Avoid slouching and forward head thrust. Maintain curve in low back and align ears over shoul- ders, hips over ankles.  Alternating Positions   Alternate tasks and change positions frequently to reduce fatigue and muscle tension. Take rest breaks. Computer Work   Position work to Art gallery manager. Use proper work and seat height. Keep shoulders back and down, wrists straight, and elbows at right angles. Use chair that provides full back support. Add footrest and lumbar roll as needed.  Getting Into / Out of Car  Lower self onto seat, scoot back, then bring in one leg at a time. Reverse sequence to get out.  Dressing  Lie on back to pull socks or slacks over feet, or sit and bend leg while keeping back straight.    Housework - Sink  Place one foot on ledge of cabinet under sink when standing at sink for prolonged periods.   Pushing / Pulling  Pushing is preferable to pulling. Keep back in proper alignment, and use leg muscles to do  the work.  Deep Squat   Squat and lift with both arms held against upper trunk. Tighten stomach muscles without holding breath. Use smooth movements to avoid jerking.  Avoid Twisting   Avoid twisting or bending back. Pivot around using foot movements, and bend at knees if needed when reaching for articles.  Carrying Luggage   Distribute weight evenly on both sides. Use a cart whenever possible. Do not twist trunk. Move body as a unit.   Lifting Principles Maintain proper posture and head alignment. Slide object as close as possible before lifting. Move obstacles out of the way. Test before lifting; ask for  help if too heavy. Tighten stomach muscles without holding breath. Use smooth movements; do not jerk. Use legs to do the work, and pivot with feet. Distribute the work load symmetrically and close to the center of trunk. Push instead of pull whenever possible.   Ask For Help   Ask for help and delegate to others when possible. Coordinate your movements when lifting together, and maintain the low back curve.  Log Roll   Lying on back, bend left knee and place left arm across chest. Roll all in one movement to the right. Reverse to roll to the left. Always move as one unit. Housework - Sweeping  Use long-handled equipment to avoid stooping.   Housework - Wiping  Position yourself as close as possible to reach work surface. Avoid straining your back.  Laundry - Unloading Wash   To unload small items at bottom of washer, lift leg opposite to arm being used to reach.  Gardening - Raking  Move close to area to be raked. Use arm movements to do the work. Keep back straight and avoid twisting.     Cart  When reaching into cart with one arm, lift opposite leg to keep back straight.   Getting Into / Out of Bed  Lower self to lie down on one side by raising legs and lowering head at the same time. Use arms to assist moving without twisting. Bend both knees to roll onto back if desired. To sit up, start from lying on side, and use same move-ments in reverse. Housework - Vacuuming  Hold the vacuum with arm held at side. Step back and forth to move it, keeping head up. Avoid twisting.   Laundry - Armed forces training and education officer so that bending and twisting can be avoided.   Laundry - Unloading Dryer  Squat down to reach into clothes dryer or use a reacher.  Gardening - Weeding / Psychiatric nurse or Kneel. Knee pads may be helpful.                    Graydon Dingwall, PT, ATRIC Certified Exercise Expert for the Aging Adult  11/09/23 10:52 AM Phone:  [phone] Fax: [phone]

## 2023-11-10 ENCOUNTER — Other Ambulatory Visit: Payer: Self-pay | Admitting: Internal Medicine

## 2023-11-10 DIAGNOSIS — K219 Gastro-esophageal reflux disease without esophagitis: Secondary | ICD-10-CM

## 2023-11-14 ENCOUNTER — Encounter: Payer: Self-pay | Admitting: Cardiology

## 2023-11-14 ENCOUNTER — Ambulatory Visit: Attending: Cardiology | Admitting: Cardiology

## 2023-11-14 VITALS — BP 120/76 | HR 70 | Ht 66.0 in | Wt 216.0 lb

## 2023-11-14 DIAGNOSIS — R0789 Other chest pain: Secondary | ICD-10-CM | POA: Diagnosis not present

## 2023-11-14 DIAGNOSIS — E785 Hyperlipidemia, unspecified: Secondary | ICD-10-CM

## 2023-11-14 DIAGNOSIS — I10 Essential (primary) hypertension: Secondary | ICD-10-CM

## 2023-11-14 DIAGNOSIS — E1169 Type 2 diabetes mellitus with other specified complication: Secondary | ICD-10-CM

## 2023-11-14 DIAGNOSIS — R600 Localized edema: Secondary | ICD-10-CM | POA: Diagnosis not present

## 2023-11-14 DIAGNOSIS — M3505 Sjogren syndrome with inflammatory arthritis: Secondary | ICD-10-CM

## 2023-11-14 NOTE — Patient Instructions (Addendum)
 Medication Instructions:   No changes *If you need a refill on your cardiac medications before your next appointment, please call your pharmacy*   Lab Work: Not needed    Testing/Procedures:  not needed  Follow-Up: At [facility], you and your health needs are our priority.  As part of our continuing mission to provide you with exceptional heart care, we have created designated Provider Care Teams.  These Care Teams include your primary Cardiologist (physician) and Advanced Practice Providers (APPs -  Physician Assistants and Nurse Practitioners) who all work together to provide you with the care you need, when you need it.     Your next appointment:   4 month(s)  The format for your next appointment:   In Person  Provider:   Alm Clay, MD   Other instruction  Will obtain records from  [facility].

## 2023-11-14 NOTE — Progress Notes (Deleted)
  Cardiology Office Note:  .   Date:  11/14/2023  ID:  [name], DOB 1955-05-06, MRN [id number] PCP: Vicci Barnie NOVAK, MD  [facility] Providers Cardiologist:  None { Click to update primary MD,subspecialty MD or APP then REFRESH:1}    No chief complaint on file.   Patient Profile: .     [name] is a *** 68 y.o. female *** with a PMH notable for *** who presents here for *** at the request of Vicci Barnie NOVAK, MD.  {There is no content from the last Narrative History section.}      [name] was last seen on ***  Subjective  Discussed the use of AI scribe software for clinical note transcription with the patient, who gave verbal consent to proceed.  History of Present Illness      Cardiovascular ROS: {roscv:310661}  ROS:  Review of Systems - {ros master:310782}    Objective    Studies Reviewed: SABRA   EKG Interpretation Date/Time:  Monday November 14 2023 09:35:06 EDT Ventricular Rate:  70 PR Interval:  126 QRS Duration:  86 QT Interval:  398 QTC Calculation: 429 R Axis:   -28  Text Interpretation: Normal sinus rhythm Normal ECG When compared with ECG of 29-Mar-2023 11:24, Premature atrial complexes NO LONGER PRESENT Confirmed by Anner Lenis ([id number]) on 11/14/2023 9:39:57 AM    ECHO: *** CATH: *** MONITOR: *** CT: ***  Risk Assessment/Calculations:   {Does this patient have ATRIAL FIBRILLATION?:[id number]}           Physical Exam:   VS:  BP 120/76   Pulse 70   Ht 5' 6 (1.676 m)   Wt 216 lb (98 kg)   SpO2 96%   BMI 34.86 kg/m    Wt Readings from Last 3 Encounters:  11/14/23 216 lb (98 kg)  10/04/23 216 lb (98 kg)  06/08/23 212 lb (96.2 kg)    GEN: Well nourished, well developed in no acute distress; *** NECK: No JVD; No carotid bruits CARDIAC: Normal S1, S2; RRR, no murmurs, rubs, gallops RESPIRATORY:  Clear to auscultation without rales, wheezing or rhonchi ; nonlabored, good air movement. ABDOMEN: Soft,  non-tender, non-distended EXTREMITIES:  No edema; No deformity      ASSESSMENT AND PLAN: .    Problem List Items Addressed This Visit       Cardiology Problems   Essential hypertension - Primary   Relevant Orders   EKG 12-Lead (Completed)    Assessment and Plan Assessment & Plan        {Are you ordering a CV Procedure (e.g. stress test, cath, DCCV, TEE, etc)?   Press F2        :[id number]}   Follow-Up: No follow-ups on file.  Total time spent: *** min spent with patient + *** min spent charting = *** min    Signed, Lenis MICAEL Anner, MD, MS Lenis Anner, M.D., M.S. Interventional Chartered certified accountant  Pager # [phone]

## 2023-11-14 NOTE — Progress Notes (Signed)
 Cardiology Office Note:  .   Date:  11/21/2023  ID:  [name], DOB Feb 02, 1956, MRN [id number] PCP: Vicci Barnie NOVAK, MD  [facility] Providers Cardiologist:  Alm Clay, MD     No chief complaint on file.   Patient Profile: .     [name] is a 68 y.o. female with a PMH notable for Sjogren Syndrome, GERD,, DM-2, HTN, HLD, Raynaud's and chronic chest pain who presents here to establish local cardiology care for management of hypertension, chest pain and palpitations as well as edema at the request of Vicci Barnie NOVAK, MD.    [name] was seen by Dr. Vicci on October 04, 2022 in follow-up noting worsening GERD/chest discomfort with a sensation of acid rising into her chest and throat.  She failed omeprazole, metoprolol and Nexium.  Avoiding spicy foods.  She also had noted episodes of heaviness in her lower chest with some fluttering.  Fluttering can last 5 to 10 seconds.  Sometimes it goes down her left arm.  Also noted some recent high blood pressure readings. => Cardiology referral placed  Subjective  Discussed the use of AI scribe software for clinical note transcription with the patient, who gave verbal consent to proceed.  History of Present Illness  [name] is a 68 year old female with GERD and Sjogren's syndrome who presents with chest discomfort and swelling.  She experiences chest discomfort described as a burning sensation, sometimes feeling like her heart 'just hurts'. Her GERD has been particularly severe over the last three months, exacerbated by consuming lemon or lime water. Acid reflux has caused acid to come up into her throat for two to three days, leading to persistent burning and discomfort. She has undergone multiple evaluations, including stress tests and a heart catheterization in 2019, which showed no blockages.  She has a history of Sjogren's syndrome, which has recently led to gland issues since May. She was prescribed  pilocarpine, which has helped with her symptoms. She has experienced swelling in her feet and legs, for which she takes furosemide  as needed, in addition to her regular hydrochlorothiazide . She reports that her swelling and weight gain have increased when she takes steroids, which she has been prescribed for gland issues.  She reports episodes of lightheadedness and near syncope, with one instance of complete syncope. These episodes are characterized by a sensation of everything going black and an inability to hold herself up. She has worn a heart monitor several times, but no arrhythmias were detected during these episodes.  She has a history of high blood pressure, for which she takes Bystolic  5 mg and hydrochlorothiazide  twice daily. She reports being told she has a heart murmur. She underwent ankle and knee surgery a year ago, which has impacted her ability to exercise, contributing to weight gain.  She experiences sciatic nerve pain, which radiates from her back to her feet, causing tingling and what she describes as neuropathy in her feet. This pain has been a source of stress and discomfort.     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 - Partner Status: Married - Living Situation: Lives in her hometown   Studies  Reviewed: SABRA   EKG Interpretation Date/Time:  Monday November 14 2023 09:35:06 EDT Ventricular Rate:  70 PR Interval:  126 QRS Duration:  86 QT Interval:  398 QTC Calculation: 429 R Axis:   -28  Text Interpretation: Normal sinus rhythm Normal ECG When compared with ECG of 29-Mar-2023 11:24, Premature atrial complexes NO LONGER PRESENT Confirmed by Anner Lenis ([id number]) on 11/14/2023 9:39:57 AM    Labs via [facility] 10/04/2023: TC 190, TG 145, HDL 49, LDL 105.  A1c 6.4. Cr 0.86, K+ 3.5.  Hgb 13.9.;  TSH 2.85.  BMP 9.7  Per her report: Cardiac catheterization (2019): No blockages Echocardiogram: No valvular abnormalities, possible mild aortic valve calcification Nuclear stress test: Normal, advised to exercise  Risk Assessment/Calculations:         Physical Exam:   VS:  BP 120/76   Pulse 70   Ht 5' 6 (1.676 m)   Wt 216 lb (98 kg)   SpO2 96%   BMI 34.86 kg/m    Wt Readings from Last 3 Encounters:  11/14/23 216 lb (98 kg)  10/04/23 216 lb (98 kg)  06/08/23 212 lb (96.2 kg)    GEN: Well nourished, well groomed in no acute distress; mildly obese but otherwise healthy NECK: No JVD; No carotid bruits CARDIAC: Normal S1, S2; RRR, no murmurs, rubs, gallops RESPIRATORY:  Clear to auscultation without rales, wheezing or rhonchi ; nonlabored, good air movement. ABDOMEN: Soft, non-tender, non-distended EXTREMITIES:  No edema; No deformity      ASSESSMENT AND PLAN: .    Problem List Items Addressed This Visit       Cardiology Problems   Essential hypertension (Chronic)   Well-controlled with Bystolic  5 mg daily and hydrochlorothiazide  25 mg twice daily (however twice daily dosing is somewhat unusual.  Recommended if she uses furosemide  for edema that she does not do well with dose of HCTZ to avoid hypokalemia.  Otherwise continue current doses..      Relevant Orders   EKG 12-Lead (Completed)   Hyperlipidemia associated with type 2 diabetes mellitus (HCC) (Chronic)   LDL 105.   Difficult to note goal about 4 neurology for cardiac issues.  Based on the fact she had normal coronaries on cath, would suspect of the LDL goal of less than the 899 is reasonable. She is currently on atorvastatin  10 mg daily which could be increased if necessary for additional control versus switch to rosuvastatin.  Not currently on medications for diabetes with most recent A1c of 6.4..        Other   Discomfort in chest - Primary (Chronic)   Very atypical sounding features but sometimes symptoms having at rest but some then sometimes with exertion or certain movements.  She has clearly had ischemic evaluations in the past and I would like to see what has been done prior to considering further evaluation.  Chest pain is not consistent or persistent.      Peripheral edema (Chronic)   Intermittent peripheral edema, likely exacerbated by steroid use.  Not associated PND orthopnea to suggest CHF. - Use furosemide  as needed, avoid concurrent use with hydrochlorothiazide  to prevent hypokalemia.      Sjogren syndrome with  inflammatory arthritis (HCC) (Chronic)   Now back following with [facility] for otology.  She says that she is now on pilocarpine.  Will need to get echo results just to ensure there is no cardiac abnormalities associate with Sjogren's.  She is not having any heart failure symptoms.  Once we get records from [facility] [address] [facility], we can determine what else needs to be ordered.               Follow-Up: Return in about 4 months (around 03/15/2024) for Routine follow up with me, To discuss test results.  After we are able to review outside records.  Total time spent: 23 min spent with patient + 25 min spent charting = 48 min    Signed, Alm MICAEL Clay, MD, MS Alm Clay, M.D., M.S. Interventional Chartered certified accountant  Pager # [phone]

## 2023-11-15 NOTE — Therapy (Signed)
 OUTPATIENT PHYSICAL THERAPY THORACOLUMBAR TREATMENT   Patient Name: [name] MRN: [id number] DOB:1955/06/30, 68 y.o., female Today's Date: 11/16/2023  END OF SESSION:  PT End of Session - 11/16/23 1019     Visit Number 3    Number of Visits 16    Date for PT Re-Evaluation 12/27/23    Authorization Type U[employer] MCR    PT Start Time 1018    PT Stop Time 1100    PT Time Calculation (min) 42 min    Activity Tolerance Patient tolerated treatment well;Patient limited by pain    Behavior During Therapy [facility] for tasks assessed/performed            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   HTN (hypertension)    Joint pain    Lactose intolerance    Multiple food allergies    OA (osteoarthritis)    Reflux    Sjogren's syndrome (HCC)    SOB (shortness of breath)    Swallowing difficulty    Thyroid  condition    Vitamin D  deficiency    Past Surgical History:  Procedure Laterality Date   APPENDECTOMY     BACK SURGERY  03/12/2010   L5   BREAST SURGERY     BIOPSY   HERNIA REPAIR     groin bilateral   NECK SURGERY     PELVIC LAPAROSCOPY  8009,8006   PILONIDAL CYST EXCISION     SALPINGOOPHORECTOMY  10/11/2002   SCOPE LSO-LEFT SEROUS CYSTADENOMA   SALPINGOOPHORECTOMY  04/12/2005   RSO/TOA   SHOULDER SURGERY Right 01/2018   TONSILLECTOMY AND ADENOIDECTOMY     TUBAL LIGATION     VAGINAL HYSTERECTOMY  01/10/2005   TVH, POSTERIOR REPAIR   Patient Active Problem List   Diagnosis Date Noted   Dry mouth 06/24/2022   Joint swelling 06/24/2022   Other specified abnormal immunological findings in serum 06/24/2022   Pain in limb 06/24/2022   Insulin  resistance 05/19/2022   Low mean corpuscular hemoglobin concentration (MCHC) 05/19/2022   Other fatigue 05/05/2022   SOBOE (shortness of breath on  exertion) 05/05/2022   Right foot pain 05/05/2022   Depression screen 05/05/2022   Arthritis 03/23/2022   Hyperlipidemia associated with type 2 diabetes mellitus (HCC) 12/24/2021   Essential hypertension 10/21/2021   Type 2 diabetes mellitus with hypoglycemia without coma, without long-term current use of insulin  (HCC) 10/21/2021   Primary osteoarthritis involving multiple joints 10/21/2021   Mild intermittent asthma without complication 10/21/2021   History of herpes genitalis 10/21/2021   History of gout 10/21/2021   Gastroesophageal reflux disease without esophagitis 10/21/2021   Persistent cough 04/20/2021   Chronic vertigo 12/04/2020   COVID-19 04/16/2020   Neuropathic pain 04/12/2020   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   Gout 12/19/2019   Carotid artery stenosis 11/24/2019   Multiple joint pain 11/24/2019   Allergy to food 05/05/2018   Non-toxic nodular goiter 08/31/2017   Reflux    Asthma    Sjogren syndrome with inflammatory arthritis (HCC)     PCP: Vicci Barnie NOVAK, MD   REFERRING PROVIDER: Vicci Barnie NOVAK, MD   REFERRING DIAG: M54.16 (ICD-10-CM) - Lumbar radiculopathy   Rationale for Evaluation and Treatment: Rehabilitation  THERAPY DIAG:  Chronic bilateral low back pain with bilateral sciatica  Muscle weakness (generalized)  Difficulty in walking, not elsewhere classified  ONSET DATE: Aug 20, 2023  SUBJECTIVE:                                                                                                                                                                                           SUBJECTIVE STATEMENT: I am scheduled for injections for back and hip on December 06, 2023. I was a 10/10 and now I am a 6-7/10.  I need to go easy today. Exercise just makes me worse.  Today pain really hits me and today I urinated on myself.  (PT insisted she call MD about her pain intensity and urination after session.  EVAL I lifted my grandson when I  picked him up (28 lb) I felt like I hurt my back and I am going on week 11 of my pain since that incident. I went to pelvic floor therapist , cheryl Elnor. And it was helpful. But now I know the exercises.  Pt has had several previous surgeries and admits she has not had a routine exercise for a while although she used to enjoy walking at [facility].  She is having difficulty performing household chores like washing dishes and vacuuming.  She would like to be able to exercise and reduce pain for more comfortable living and return to more active lifestyle  PERTINENT HISTORY:  DOS: 10/07/2022 Rt  triple arthrodesis (subtalar joint, the talonavicular joint, and the calcaneocuboid joint) gastrocnemius recession peroneal tendon lengthening with tibial bone marrow aspirate. Lumbar surgery in 2010 L5,  cervical surgery 2002 C 6/7 fusion, Right shoulder arthroscopy RTC 2019, DM HTN, asthma, OA, Sjogren's syndrome, RA,   PAIN:  Are you having pain? Yes: NPRS scale: at rest 5/10 and at worst 10/10 Pain location: low back bil radiating to both feet to big toes bil Pain description: numbness and tingling in Right toe. Pain in left toe Aggravating factors: Transition movements , getting in and out of bed, stepping into SUV, getting up to walking and holding on, standing for longer than 5 minutes. Can't wash dishes and mopping, vacuuming, stairs in dtr house, walking no more than 10 minutes Relieving factors: takes tylenol, heat  PRECAUTIONS: None  RED FLAGS: None   WEIGHT BEARING RESTRICTIONS: No  FALLS:  Has patient fallen in last 6 months? No  LIVING ENVIRONMENT: Lives with: lives with their family and lives alone Lives in: House/apartment Stairs: No Has following equipment at home: Single point cane  OCCUPATION: retired Child psychotherapist  PLOF: Independent  PATIENT GOALS: Be able to pick  up my grandson and to get back to walking for exercise  NEXT MD VISIT: TBD  OBJECTIVE:  Note: Objective  measures were completed at Evaluation unless otherwise noted.  DIAGNOSTIC FINDINGS:  Pt completed but not read yet. Please see medical records  PATIENT SURVEYS:  Modified Oswestry:  MODIFIED OSWESTRY DISABILITY SCALE  Date: 11-01-23 Score  Pain intensity 1 = The pain is bad, but I can manage without having to take (1) I can stand as long as I want but, it increases my pain. pain medication.  2. Personal care (washing, dressing, etc.) 0 =  I can take care of myself normally without causing increased pain.  3. Lifting 4 = I can lift only very light weights  4. Walking 1 = Pain prevents me from walking more than 1 mile.  5. Sitting 2 =  Pain prevents me from sitting more than 1 hour.  6. Standing 3 =  Pain prevents me from standing more than 1/2 hour.  7. Sleeping 3 =  Even when I take pain medication, I sleep less than 4 hours.  8. Social Life 2 = Pain prevents me from participating in more energetic activities (eg. sports, dancing).  9. Traveling 1 =  I can travel anywhere, but it increases my pain.  10. Employment/ Homemaking 2 = I can perform most of my homemaking/job duties, but pain prevents me from performing more physically stressful activities (eg, lifting, vacuuming).  Total 19/50  38%   Interpretation of scores: Score Category Description  0-20% Minimal Disability The patient can cope with most living activities. Usually no treatment is indicated apart from advice on lifting, sitting and exercise  21-40% Moderate Disability The patient experiences more pain and difficulty with sitting, lifting and standing. Travel and social life are more difficult and they may be disabled from work. Personal care, sexual activity and sleeping are not grossly affected, and the patient can usually be managed by conservative means  41-60% Severe Disability Pain remains the main problem in this group, but activities of daily living are affected. These patients require a detailed investigation  61-80%  Crippled Back pain impinges on all aspects of the patient's life. Positive intervention is required  81-100% Bed-bound  These patients are either bed-bound or exaggerating their symptoms  Bluford FORBES Zoe DELENA Karon DELENA, et al. Surgery versus conservative management of stable thoracolumbar fracture: the PRESTO feasibility RCT. [place]): [employer]; 2021 Nov. [facility] [employer] Assessment, No. 25.62.) Appendix 3, Oswestry Disability Index category descriptors. Available from: FindJewelers.cz  Minimally Clinically Important Difference (MCID) = 12.8%  COGNITION: Overall cognitive status: Within functional limits for tasks assessed     SENSATION: Numbness in bil LE with pain down to bil Great toes  R > L  MUSCLE LENGTH: Hamstrings: bil tighteness about 60 degrees Thomas test:  Positive Right 20 degrees from horizontal and Left 20 degrees from horizontal   POSTURE: rounded shoulders, forward head, weight shift left, and scoliosis and obesity  PALPATION: TTP over bil SI joints r > L, also TTP over L 5, s1  LUMBAR ROM:   AROM eval  Flexion Fingertips to ankle bil *  Extension 50 % *  Right lateral flexion 25 %  Left lateral flexion 25%  Right rotation 50%  Left rotation 50%   Key: WFL = within functional limits not formally assessed, * = concordant pain, s = stiffness/stretching sensation, NT = not tested)   (Blank rows = not tested, score listed is out of  5 possible points.  N = WNL, D = diminished, C = clear for gross weakness with myotome testing, * = concordant pain with testing)   LOWER EXTREMITY ROM:     Active  Right eval Left eval 11-09-23 R/L  Hip flexion     Hip extension     Hip abduction     Hip adduction     Hip internal rotation 23 30   Hip external rotation 45 49   Knee flexion 120 120  Left  prone 100 Right prone 112  Knee extension     Ankle dorsiflexion 6 9   Ankle plantarflexion     Ankle inversion      Ankle eversion      Key: WFL = within functional limits not formally assessed, * = concordant pain, s = stiffness/stretching sensation, NT = not tested)    LOWER EXTREMITY MMT:    MMT Right eval Left eval  Hip flexion 4- 4  Hip extension 4- 4  Hip abduction 3+ 4-  Hip adduction    Hip internal rotation    Hip external rotation    Knee flexion 4 4+  Knee extension 4 4+  Ankle dorsiflexion 4- 4  Ankle plantarflexion    Ankle inversion    Ankle eversion    Blank rows = not tested, score listed is out of 5 possible points.  N = WNL, D = diminished, C = clear for gross weakness with myotome testing, * = concordant pain with testing)   LUMBAR SPECIAL TESTS:   SLR test R +  SLump test negative  bil SI distraction and compression + bil   FABER - negative FADIR - Negative  FUNCTIONAL TESTS:  5 times sit to stand: 21.92 sec 2 minute walk test: 309.3 ft ( norm 509 ft)  GAIT: Distance walked: 309.3 ft ( norm 509 ft) Assistive device utilized: None Level of assistance: Complete Independence Comments: Pt with wide based gait and antalgic more on R than left  Trendelenberg Rgith  TREATMENT DATE: [facility] Adult PT Treatment:                                                DATE: 11-16-23 Therapeutic Exercise: Hip flexion with 15 # KB  2 x 5 R and L Supine Pelvic Tilt  10 x 5 sec hold SKTC 5 x 5-10 sec hold LTR  3 reps each side- 20 hold Supine Bridge with ball  2 x 10 Manual Therapy: STW of Vastus Lateralis and rectus IASTYM of Vastus lateralis and rectus on Left LE Contract relax of Left quad muscle  MET of Right SI with decrease of pain Modalities Moist hot pack Therapeutic Activity: NuStep UE/LE and 6 min level 5 while taking subjective STS with15 # KB   10 x only  [facility] Adult PT Treatment:                                                DATE: 11-09-23 Therapeutic Exercise: Supine Pelvic Tilt  10 x 5 sec hold SKTC 5 x 5-10 sec hold LTR  3 reps each side- 20 hold Supine  Bridge  2 x 10 Supine bridge on pball 4 x but with neuro  signs of ankle thigh and calf with cramping so DC Hip flexor stretch at edge of bed with SLR resisted  Seated Hamstring Stretch   VC and TC Quad stretch prone bil Manual Therapy: STW of Vastus Lateralis and rectus IASTYM of Vastus lateralis and rectus on Left LE Contract relax of Left quad muscle    Therapeutic Activity: NuStep UE/LE and 6 min level 4 while taking subjective STS with15 # KB   4 x only Self Care: Posture sitting and standing Body mechanics and household posture     EVAL and issue HEP and explanation of condition management                                                                                                                                 PATIENT EDUCATION:  Education details: POC Explanation of findings, issue HEP, condition managment Person educated: Patient Education method: Explanation, Demonstration, Tactile cues, Verbal cues, and Handouts Education comprehension: verbalized understanding, returned demonstration, verbal cues required, tactile cues required, and needs further education  HOME EXERCISE PROGRAM: Access Code: [email or web address] URL: [email or web address] Date: 11/01/2023 Prepared by: Graydon Dingwall  Exercises - Supine Pelvic Tilt  - 1 x daily - 7 x weekly - 3 sets - 10 reps - Supine Single Knee to Chest Stretch  - 2 x daily - 7 x weekly - 1 sets - 5 reps - 10 hold - Supine Lower Trunk Rotation  - 2 x daily - 7 x weekly - 1 sets - 5 reps - 20 hold - Supine Bridge  - 1 x daily - 7 x weekly - 2 sets - 10 reps - Seated Hamstring Stretch  - 1 x daily - 7 x weekly - 1 sets - 2 reps - 60 sec hold - Hip flexor stretch at edge of bed with SLR resisted  - 2 x daily - 7 x weekly - 1 sets - 1-2 reps - 60 hold Added 11-08-33 - Supine Figure 4 Piriformis Stretch  - 1 x daily - 7 x weekly - 1 sets - 2-3 reps - 30 sec hold - Sit to stand with sink support Movement snack  - 1 x daily -  7 x weekly - 3 sets - 10 reps - Prone Quadriceps Stretch with Strap  - 1 x daily - 7 x weekly - 1 sets - 2 reps - 60 sec hold ASSESSMENT:  CLINICAL IMPRESSION: Ms. Meland returns for 3rd visit and explains she is planning to get injections  August 26th for hips and back. She explains that she had 10/10 last night and 6-7/10 now and she had so much pain she urinated on herself. Pt explained that she needs to call MD soon and inform him. Although she denied having lack of control of bowels, PT still encouraged her to seek MD care and attention.  Session today concentrated on exercises that will decrease pain and worked on pain management with manual and  there ex/there act. Will continue to progress as pt is able. TPDN next visit over painful areas of hip as pt tolerates and requests after being educted on benefit.    EVAL- Patient is a 68 y.o. female who was seen today for physical therapy evaluation and treatment for bil low back pain with bil radiculopathy into bil Great toes with numbness in  R> L. Pt also with TTP over bil SI with R > L.  Pegeen notes  increased LBP and constant bil  radicular pain  following injury after lifting 28 lb grandson May 2025.  Pt presents with signs and symptoms compatible with lumbar radiculopathy. Pt also has scoliosis. Pt would benefit from skilled PT for 1-2 times a week for 8 weeks to address above impariments and functional limitations and return to pain-free PLOF.   OBJECTIVE IMPAIRMENTS: decreased activity tolerance, decreased balance, decreased mobility, difficulty walking, decreased ROM, decreased strength, impaired sensation, improper body mechanics, postural dysfunction, obesity, and pain.   ACTIVITY LIMITATIONS: carrying, lifting, bending, standing, squatting, stairs, transfers, locomotion level, and caring for others  PARTICIPATION LIMITATIONS: meal prep, cleaning, laundry, driving, community activity, church, and caring for grandchild  PERSONAL FACTORS:  DOS: 10/07/2022 Rt  triple arthrodesis (subtalar joint, the talonavicular joint, and the calcaneocuboid joint) gastrocnemius recession peroneal tendon lengthening with tibial bone marrow aspirate. Lumbar surgery in 2010 L5,  cervical surgery 2002 C 6/7 fusion, Right shoulder arthroscopy RTC 2019, DM HTN, asthma, OA, Sjogren's syndrome, RA,  are also affecting patient's functional outcome.   REHAB POTENTIAL: Good  CLINICAL DECISION MAKING: Evolving/moderate complexity  EVALUATION COMPLEXITY: Moderate   GOALS: Goals reviewed with patient? Yes  SHORT TERM GOALS: Target date: 11-29-23  Independent with initial HEP Baseline:no knowledge Goal status: INITIAL  2.  Sit and stand with RT=LT wt bearing to reduce lumbar strain and allow for increased tolerance for these positions for home tasks Baseline: wt shift to left Goal status: INITIAL  3.  Demonstrate and verbalize techniques to reduce the risk of re-injury including: lifting, posture, body mechanics.  Baseline: no knowledge Goal status: INITIAL  4.  Pt will perform 5xSTS in <19.0 sec sec in order to demonstrate reduced fall risk and improved functional independence. (MCID of 2.3sec) Baseline: 21.92 Goal status: INITIAL    LONG TERM GOALS: Target date: 12-27-23  Pt will be independent with advanced HEP.  Baseline: no knowlege Goal status: INITIAL  2.  Enora will be able to carry grandson with good body mechanics without exacerbating pain. Amedeo is 30lb) Baseline: Pt injured herself picking up grandson in May 2025 Goal status: INITIAL  3.  Walk for 30 minutes with minimal pain in order to develop healthy habit Baseline: No routine exercise due to pain Goal status: INITIAL  4.  Pt. will show a >/= 12 pt improvement in their ODI score (MCID is 12 pts) as a proxy for functional improvement Baseline: 19/50  38 % Goal status: INITIAL  5.  Pt.  will improve two minute walk test to 327feet with =< SPC (MCID 40 ft). Baseline:  309.3 ft  Goal status: INITIAL  6.  Pt. Will be able to stand for 25 minutes with  pain no greater than 3/10 in order to complete household chores.   Baseline: Pt is not doing any household chores presently since injury Goal status: INITIAL  PLAN:  PT FREQUENCY: 1-2x/week  PT DURATION: 8 weeks  PLANNED INTERVENTIONS: 97164- PT Re-evaluation, 97750- Physical Performance Testing, 97110-Therapeutic exercises, 97530- Therapeutic activity, [id number]- Neuromuscular  re-education, [phone]- Self Care, 02859- Manual therapy, [phone]- Gait training, [phone]- Aquatic Therapy, [phone]- Electrical stimulation (manual), [phone] (1-2 muscles), 20561 (3+ muscles)- Dry Needling, Patient/Family education, Balance training, Stair training, Taping, Joint mobilization, Spinal mobilization, Cryotherapy, and Moist heat.  PLAN FOR NEXT SESSION: Progress HEP, Pharmacist, community training for caring for grandchildren. Check LLD and SI levels   Graydon Dingwall, PT, [facility] Certified Exercise Expert for the Aging Adult  11/16/23 12:08 PM Phone: [phone] Fax: [phone]   Date of referral: 10-04-23 Referring provider: Vicci Barnie NOVAK, MD  Referring diagnosis? M54.16 (ICD-10-CM) - Lumbar radiculopathy  Treatment diagnosis? (if different than referring diagnosis) muscle weakness, and difficulty walking,   What was this (referring dx) caused by? Arthritis and Other: lifting injury(of grandchild  Lysle of Condition: Initial Onset (within last 3 months)   Laterality: Both  Current Functional Measure Score: Other ODI  19/50  38%  Objective measurements identify impairments when they are compared to normal values, the uninvolved extremity, and prior level of function.  [x]  Yes  []  No  Objective assessment of functional ability: Moderate functional limitations   Briefly describe symptoms: bil back pain with radiating pain to Great toes bil R > L  How did symptoms start: lifting 28 lb grandchild 11 weeks  ago  Average pain intensity:  Last 24 hours: 7/10  Past week: 7/10 to 10/10  How often does the pt experience symptoms? Constantly  How much have the symptoms interfered with usual daily activities? A little bit  How has condition changed since care began at this facility? NA - initial visit  In general, how is the patients overall health? Good   BACK PAIN (STarT Back Screening Tool) Has pain spread down the leg(s) at some time in the last 2 weeks? Yes bil LE's Has there been pain in the shoulder or neck at some time in the last 2 weeks? Yes with rain and OA past surgery in neck Has the pt only walked short distances because of back pain? Yes Has patient dressed more slowly because of back pain in the past 2 weeks? Yes Does patient think it's not safe for a person with this condition to be physically active? yes Does patient have worrying thoughts a lot of the time? yes Does patient feel back pain is terrible and will never get any better? no Has patient stopped enjoying things they usually enjoy? Yes because I can't dance and run or walk in the park

## 2023-11-16 ENCOUNTER — Ambulatory Visit: Attending: Internal Medicine | Admitting: Physical Therapy

## 2023-11-16 DIAGNOSIS — M6281 Muscle weakness (generalized): Secondary | ICD-10-CM | POA: Insufficient documentation

## 2023-11-16 DIAGNOSIS — G8929 Other chronic pain: Secondary | ICD-10-CM | POA: Diagnosis not present

## 2023-11-16 DIAGNOSIS — M5442 Lumbago with sciatica, left side: Secondary | ICD-10-CM | POA: Diagnosis not present

## 2023-11-16 DIAGNOSIS — R262 Difficulty in walking, not elsewhere classified: Secondary | ICD-10-CM | POA: Diagnosis not present

## 2023-11-16 DIAGNOSIS — M5441 Lumbago with sciatica, right side: Secondary | ICD-10-CM | POA: Diagnosis not present

## 2023-11-16 NOTE — Therapy (Signed)
 OUTPATIENT PHYSICAL THERAPY THORACOLUMBAR TREATMENT   Patient Name: [name] MRN: [id number] DOB:1956/01/28, 68 y.o., female Today's Date: 11/17/2023  END OF SESSION:  PT End of Session - 11/17/23 1138     Visit Number 4    Number of Visits 16    Date for PT Re-Evaluation 12/27/23    Authorization Type [employer] MCR    PT Start Time 1103    PT Stop Time 1145    PT Time Calculation (min) 42 min    Activity Tolerance Patient tolerated treatment well;Patient limited by pain    Behavior During Therapy [facility] for tasks assessed/performed             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   HTN (hypertension)    Joint pain    Lactose intolerance    Multiple food allergies    OA (osteoarthritis)    Reflux    Sjogren's syndrome (HCC)    SOB (shortness of breath)    Swallowing difficulty    Thyroid  condition    Vitamin D  deficiency    Past Surgical History:  Procedure Laterality Date   APPENDECTOMY     BACK SURGERY  03/12/2010   L5   BREAST SURGERY     BIOPSY   HERNIA REPAIR     groin bilateral   NECK SURGERY     PELVIC LAPAROSCOPY  8009,8006   PILONIDAL CYST EXCISION     SALPINGOOPHORECTOMY  10/11/2002   SCOPE LSO-LEFT SEROUS CYSTADENOMA   SALPINGOOPHORECTOMY  04/12/2005   RSO/TOA   SHOULDER SURGERY Right 01/2018   TONSILLECTOMY AND ADENOIDECTOMY     TUBAL LIGATION     VAGINAL HYSTERECTOMY  01/10/2005   TVH, POSTERIOR REPAIR   Patient Active Problem List   Diagnosis Date Noted   Dry mouth 06/24/2022   Joint swelling 06/24/2022   Other specified abnormal immunological findings in serum 06/24/2022   Pain in limb 06/24/2022   Insulin  resistance 05/19/2022   Low mean corpuscular hemoglobin concentration (MCHC) 05/19/2022   Other fatigue 05/05/2022   SOBOE (shortness of breath on  exertion) 05/05/2022   Right foot pain 05/05/2022   Depression screen 05/05/2022   Arthritis 03/23/2022   Hyperlipidemia associated with type 2 diabetes mellitus (HCC) 12/24/2021   Essential hypertension 10/21/2021   Type 2 diabetes mellitus with hypoglycemia without coma, without long-term current use of insulin  (HCC) 10/21/2021   Primary osteoarthritis involving multiple joints 10/21/2021   Mild intermittent asthma without complication 10/21/2021   History of herpes genitalis 10/21/2021   History of gout 10/21/2021   Gastroesophageal reflux disease without esophagitis 10/21/2021   Persistent cough 04/20/2021   Chronic vertigo 12/04/2020   COVID-19 04/16/2020   Neuropathic pain 04/12/2020   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   Gout 12/19/2019   Carotid artery stenosis 11/24/2019   Multiple joint pain 11/24/2019   Allergy to food 05/05/2018   Non-toxic nodular goiter 08/31/2017   Reflux    Asthma    Sjogren syndrome with inflammatory arthritis (HCC)     PCP: Vicci Barnie NOVAK, MD   REFERRING PROVIDER: Vicci Barnie NOVAK, MD   REFERRING DIAG: M54.16 (ICD-10-CM) - Lumbar radiculopathy   Rationale for Evaluation and Treatment: Rehabilitation  THERAPY  DIAG:  Chronic bilateral low back pain with bilateral sciatica  Muscle weakness (generalized)  Difficulty in walking, not elsewhere classified  ONSET DATE: Aug 20, 2023  SUBJECTIVE:                                                                                                                                                                                           SUBJECTIVE STATEMENT: I am scheduled for injections for back and hip on December 06, 2023. I was  am a 8/10.  Today pain really hits me and I would like to try dry needling.    EVAL I lifted my grandson when I picked him up (28 lb) I felt like I hurt my back and I am going on week 11 of my pain since that incident. I went to pelvic floor therapist , [name]. And it was helpful. But now I know the exercises.  Pt has had several previous surgeries and admits she has not had a routine exercise for a while although she used to enjoy walking at [facility].  She is having difficulty performing household chores like washing dishes and vacuuming.  She would like to be able to exercise and reduce pain for more comfortable living and return to more active lifestyle  PERTINENT HISTORY:  DOS: 10/07/2022 Rt  triple arthrodesis (subtalar joint, the talonavicular joint, and the calcaneocuboid joint) gastrocnemius recession peroneal tendon lengthening with tibial bone marrow aspirate. Lumbar surgery in 2010 L5,  cervical surgery 2002 C 6/7 fusion, Right shoulder arthroscopy RTC 2019, DM HTN, asthma, OA, Sjogren's syndrome, RA,   PAIN:  Are you having pain? Yes: NPRS scale: at rest 5/10 and at worst 10/10 Pain location: low back bil radiating to both feet to big toes bil Pain description: numbness and tingling in Right toe. Pain in left toe Aggravating factors: Transition movements , getting in and out of bed, stepping into SUV, getting up to walking and holding on, standing for longer than 5 minutes. Can't wash dishes and mopping, vacuuming, stairs in dtr house, walking no more than 10 minutes Relieving factors: takes tylenol, heat  PRECAUTIONS: None  RED FLAGS: None   WEIGHT BEARING RESTRICTIONS: No  FALLS:  Has patient fallen in last 6 months? No  LIVING ENVIRONMENT: Lives with: lives with their family and lives alone Lives in: House/apartment Stairs: No Has following equipment at home: Single point cane  OCCUPATION: retired Child psychotherapist  PLOF: Independent  PATIENT GOALS: Be able to pick up my grandson and to get back to walking for exercise  NEXT MD VISIT: TBD  OBJECTIVE:  Note: Objective measures were completed  at Evaluation unless otherwise noted.  DIAGNOSTIC FINDINGS:  Pt completed but not read yet. Please see medical  records  PATIENT SURVEYS:  Modified Oswestry:  MODIFIED OSWESTRY DISABILITY SCALE  Date: 11-01-23 Score  Pain intensity 1 = The pain is bad, but I can manage without having to take (1) I can stand as long as I want but, it increases my pain. pain medication.  2. Personal care (washing, dressing, etc.) 0 =  I can take care of myself normally without causing increased pain.  3. Lifting 4 = I can lift only very light weights  4. Walking 1 = Pain prevents me from walking more than 1 mile.  5. Sitting 2 =  Pain prevents me from sitting more than 1 hour.  6. Standing 3 =  Pain prevents me from standing more than 1/2 hour.  7. Sleeping 3 =  Even when I take pain medication, I sleep less than 4 hours.  8. Social Life 2 = Pain prevents me from participating in more energetic activities (eg. sports, dancing).  9. Traveling 1 =  I can travel anywhere, but it increases my pain.  10. Employment/ Homemaking 2 = I can perform most of my homemaking/job duties, but pain prevents me from performing more physically stressful activities (eg, lifting, vacuuming).  Total 19/50  38%   Interpretation of scores: Score Category Description  0-20% Minimal Disability The patient can cope with most living activities. Usually no treatment is indicated apart from advice on lifting, sitting and exercise  21-40% Moderate Disability The patient experiences more pain and difficulty with sitting, lifting and standing. Travel and social life are more difficult and they may be disabled from work. Personal care, sexual activity and sleeping are not grossly affected, and the patient can usually be managed by conservative means  41-60% Severe Disability Pain remains the main problem in this group, but activities of daily living are affected. These patients require a detailed investigation  61-80% Crippled Back pain impinges on all aspects of the patient's life. Positive intervention is required  81-100% Bed-bound  These patients are  either bed-bound or exaggerating their symptoms  Bluford FORBES Zoe DELENA Karon DELENA, et al. Surgery versus conservative management of stable thoracolumbar fracture: the PRESTO feasibility RCT. [place] ([place]): [employer]; 2021 Nov. [facility] Technology Assessment, No. 25.62.) Appendix 3, Oswestry Disability Index category descriptors. Available from: FindJewelers.cz  Minimally Clinically Important Difference (MCID) = 12.8%  COGNITION: Overall cognitive status: Within functional limits for tasks assessed     SENSATION: Numbness in bil LE with pain down to bil Great toes  R > L  MUSCLE LENGTH: Hamstrings: bil tighteness about 60 degrees Thomas test:  Positive Right 20 degrees from horizontal and Left 20 degrees from horizontal   POSTURE: rounded shoulders, forward head, weight shift left, and scoliosis and obesity  PALPATION: TTP over bil SI joints r > L, also TTP over L 5, s1  LUMBAR ROM:   AROM eval  Flexion Fingertips to ankle bil *  Extension 50 % *  Right lateral flexion 25 %  Left lateral flexion 25%  Right rotation 50%  Left rotation 50%   Key: WFL = within functional limits not formally assessed, * = concordant pain, s = stiffness/stretching sensation, NT = not tested)   (Blank rows = not tested, score listed is out of 5 possible points.  N = WNL, D = diminished, C = clear for gross weakness with myotome testing, * = concordant pain with  testing)   LOWER EXTREMITY ROM:     Active  Right eval Left eval 11-09-23 R/L  Hip flexion     Hip extension     Hip abduction     Hip adduction     Hip internal rotation 23 30   Hip external rotation 45 49   Knee flexion 120 120  Left  prone 100 Right prone 112  Knee extension     Ankle dorsiflexion 6 9   Ankle plantarflexion     Ankle inversion     Ankle eversion      Key: WFL = within functional limits not formally assessed, * = concordant pain, s = stiffness/stretching sensation, NT  = not tested)    LOWER EXTREMITY MMT:    MMT Right eval Left eval  Hip flexion 4- 4  Hip extension 4- 4  Hip abduction 3+ 4-  Hip adduction    Hip internal rotation    Hip external rotation    Knee flexion 4 4+  Knee extension 4 4+  Ankle dorsiflexion 4- 4  Ankle plantarflexion    Ankle inversion    Ankle eversion    Blank rows = not tested, score listed is out of 5 possible points.  N = WNL, D = diminished, C = clear for gross weakness with myotome testing, * = concordant pain with testing)   LUMBAR SPECIAL TESTS:   SLR test R +  SLump test negative  bil SI distraction and compression + bil   FABER - negative FADIR - Negative  FUNCTIONAL TESTS:  5 times sit to stand: 21.92 sec 2 minute walk test: 309.3 ft ( norm 509 ft)  GAIT: Distance walked: 309.3 ft ( norm 509 ft) Assistive device utilized: None Level of assistance: Complete Independence Comments: Pt with wide based gait and antalgic more on R than left  Trendelenberg Rgith  TREATMENT DATE: [facility] Adult PT Treatment:                                                DATE: 11-17-23 Check for TPDN Goals Therapeutic Exercise: Supine Pelvic Tilt  10 x 5 sec hold SKTC 5 x 5-10 sec hold LTR  3 reps each side- 20 hold Prone presses and abdominal engagement Manual Therapy: STW  bil Quadratus lumborum, bil gluteals by iliac crest, bil piriformis, bil lumbar L-3 to L-5 on right and bil thoracic t-8 to L- 1 on R Myofasical release of bil QL both in S/L LAD  Right  Trigger Point Dry Needling  Initial Treatment: Pt instructed on Dry Needling rational, procedures, and possible side effects. Pt instructed to expect mild to moderate muscle soreness later in the day and/or into the next day.  Pt instructed in methods to reduce muscle soreness. Pt instructed to continue prescribed HEP. Because Dry Needling was performed over or adjacent to a lung field, pt was educated on S/S of pneumothorax and to seek immediate medical  attention should they occur.  Patient was educated on signs and symptoms of infection and other risk factors and advised to seek medical attention should they occur.  Patient verbalized understanding of these instructions and education.   Patient Verbal Consent Given: Yes Education Handout Provided: Yes Muscles Treated: bil Quadratus lumborum, bil gluteals by iliac crest, bil piriformis, bil lumbar L-3 to L-5 on right and thoracic t-8 to  L- 1 on R Electrical Stimulation Performed: No Treatment Response/Outcome: multiple muscle twitch and decreased muscle tension and pain  Modalities: Moist heat pack   [facility] Adult PT Treatment:                                                DATE: 11-16-23 Therapeutic Exercise: Hip flexion with 15 # KB  2 x 5 R and L Supine Pelvic Tilt  10 x 5 sec hold SKTC 5 x 5-10 sec hold LTR  3 reps each side- 20 hold Supine Bridge with ball  2 x 10 Manual Therapy: STW of Vastus Lateralis and rectus IASTYM of Vastus lateralis and rectus on Left LE Contract relax of Left quad muscle  MET of Right SI with decrease of pain Modalities Moist hot pack Therapeutic Activity: NuStep UE/LE and 6 min level 5 while taking subjective STS with15 # KB   10 x only  [facility] Adult PT Treatment:                                                DATE: 11-09-23 Therapeutic Exercise: Supine Pelvic Tilt  10 x 5 sec hold SKTC 5 x 5-10 sec hold LTR  3 reps each side- 20 hold Supine Bridge  2 x 10 Supine bridge on pball 4 x but with neuro signs of ankle thigh and calf with cramping so DC Hip flexor stretch at edge of bed with SLR resisted  Seated Hamstring Stretch   VC and TC Quad stretch prone bil Manual Therapy: STW of Vastus Lateralis and rectus IASTYM of Vastus lateralis and rectus on Left LE Contract relax of Left quad muscle    Therapeutic Activity: NuStep UE/LE and 6 min level 4 while taking subjective STS with15 # KB   4 x only Self Care: Posture sitting and standing Body  mechanics and household posture     EVAL and issue HEP and explanation of condition management                                                                                                                                 PATIENT EDUCATION:  Education details: POC Explanation of findings, issue HEP, condition managment Person educated: Patient Education method: Explanation, Demonstration, Tactile cues, Verbal cues, and Handouts Education comprehension: verbalized understanding, returned demonstration, verbal cues required, tactile cues required, and needs further education  HOME EXERCISE PROGRAM: Access Code: [email or web address] URL: [email or web address] Date: 11/01/2023 Prepared by: Graydon Dingwall  Exercises - Supine Pelvic Tilt  - 1 x daily - 7 x weekly - 3 sets - 10 reps - Supine Single Knee to Chest Stretch  - 2 x daily -  7 x weekly - 1 sets - 5 reps - 10 hold - Supine Lower Trunk Rotation  - 2 x daily - 7 x weekly - 1 sets - 5 reps - 20 hold - Supine Bridge  - 1 x daily - 7 x weekly - 2 sets - 10 reps - Seated Hamstring Stretch  - 1 x daily - 7 x weekly - 1 sets - 2 reps - 60 sec hold - Hip flexor stretch at edge of bed with SLR resisted  - 2 x daily - 7 x weekly - 1 sets - 1-2 reps - 60 hold Added 11-08-33 - Supine Figure 4 Piriformis Stretch  - 1 x daily - 7 x weekly - 1 sets - 2-3 reps - 30 sec hold - Sit to stand with sink support Movement snack  - 1 x daily - 7 x weekly - 3 sets - 10 reps - Prone Quadriceps Stretch with Strap  - 1 x daily - 7 x weekly - 1 sets - 2 reps - 60 sec hold ASSESSMENT:  CLINICAL IMPRESSION: Ms. Moncada returns for 3rd visit and explains she is planning to get injections  August 26th for hips and back. She explains that she has 8/10  Pt has been put on cancellation list with MD to get injections earlier as she contacted MD after last session.   Session today concentrated on exercises that will decrease pain  and had manual and TPDN for muscle  spasm release.  Pt was closely monitored throughout session and had decreased muscle tension in low back and hips when she left clinic.  Will continue to progress as pt is able. TPDN next visit over painful areas of hip as pt tolerates and requests after being educted on benefit.    EVAL- Patient is a 68 y.o. female who was seen today for physical therapy evaluation and treatment for bil low back pain with bil radiculopathy into bil Great toes with numbness in  R> L. Pt also with TTP over bil SI with R > L.  Kiana notes  increased LBP and constant bil  radicular pain  following injury after lifting 28 lb grandson May 2025.  Pt presents with signs and symptoms compatible with lumbar radiculopathy. Pt also has scoliosis. Pt would benefit from skilled PT for 1-2 times a week for 8 weeks to address above impariments and functional limitations and return to pain-free PLOF.   OBJECTIVE IMPAIRMENTS: decreased activity tolerance, decreased balance, decreased mobility, difficulty walking, decreased ROM, decreased strength, impaired sensation, improper body mechanics, postural dysfunction, obesity, and pain.   ACTIVITY LIMITATIONS: carrying, lifting, bending, standing, squatting, stairs, transfers, locomotion level, and caring for others  PARTICIPATION LIMITATIONS: meal prep, cleaning, laundry, driving, community activity, church, and caring for grandchild  PERSONAL FACTORS: DOS: 10/07/2022 Rt  triple arthrodesis (subtalar joint, the talonavicular joint, and the calcaneocuboid joint) gastrocnemius recession peroneal tendon lengthening with tibial bone marrow aspirate. Lumbar surgery in 2010 L5,  cervical surgery 2002 C 6/7 fusion, Right shoulder arthroscopy RTC 2019, DM HTN, asthma, OA, Sjogren's syndrome, RA,  are also affecting patient's functional outcome.   REHAB POTENTIAL: Good  CLINICAL DECISION MAKING: Evolving/moderate complexity  EVALUATION COMPLEXITY: Moderate   GOALS: Goals reviewed with  patient? Yes  SHORT TERM GOALS: Target date: 11-29-23  Independent with initial HEP Baseline:no knowledge Goal status: INITIAL  2.  Sit and stand with RT=LT wt bearing to reduce lumbar strain and allow for increased tolerance for these positions for home tasks Baseline:  wt shift to left Goal status: INITIAL  3.  Demonstrate and verbalize techniques to reduce the risk of re-injury including: lifting, posture, body mechanics.  Baseline: no knowledge Goal status: INITIAL  4.  Pt will perform 5xSTS in <19.0 sec sec in order to demonstrate reduced fall risk and improved functional independence. (MCID of 2.3sec) Baseline: 21.92 Goal status: INITIAL    LONG TERM GOALS: Target date: 12-27-23  Pt will be independent with advanced HEP.  Baseline: no knowlege Goal status: INITIAL  2.  Tarnisha will be able to carry grandson with good body mechanics without exacerbating pain. Amedeo is 30lb) Baseline: Pt injured herself picking up grandson in May 2025 Goal status: INITIAL  3.  Walk for 30 minutes with minimal pain in order to develop healthy habit Baseline: No routine exercise due to pain Goal status: INITIAL  4.  Pt. will show a >/= 12 pt improvement in their ODI score (MCID is 12 pts) as a proxy for functional improvement Baseline: 19/50  38 % Goal status: INITIAL  5.  Pt.  will improve two minute walk test to 341feet with =< SPC (MCID 40 ft). Baseline: 309.3 ft  Goal status: INITIAL  6.  Pt. Will be able to stand for 25 minutes with  pain no greater than 3/10 in order to complete household chores.   Baseline: Pt is not doing any household chores presently since injury Goal status: INITIAL  PLAN:  PT FREQUENCY: 1-2x/week  PT DURATION: 8 weeks  PLANNED INTERVENTIONS: [id number]- PT Re-evaluation, 97750- Physical Performance Testing, 97110-Therapeutic exercises, 97530- Therapeutic activity, [id number]- Neuromuscular re-education, 97535- Self Care, [id number]- Manual therapy, [id number]- Gait  training, [phone]- Aquatic Therapy, [phone]- Electrical stimulation (manual), [phone] (1-2 muscles), [id number] (3+ muscles)- Dry Needling, Patient/Family education, Balance training, Stair training, Taping, Joint mobilization, Spinal mobilization, Cryotherapy, and Moist heat.  PLAN FOR NEXT SESSION: Progress HEP, Pharmacist, community training for caring for grandchildren. Check LLD and SI levels   Graydon Dingwall, PT, [facility] Certified Exercise Expert for the Aging Adult  11/17/23 2:43 PM Phone: [phone] Fax: [phone]   Date of referral: 10-04-23 Referring provider: Vicci Barnie NOVAK, MD  Referring diagnosis? M54.16 (ICD-10-CM) - Lumbar radiculopathy  Treatment diagnosis? (if different than referring diagnosis) muscle weakness, and difficulty walking,   What was this (referring dx) caused by? Arthritis and Other: lifting injury(of grandchild  Lysle of Condition: Initial Onset (within last 3 months)   Laterality: Both  Current Functional Measure Score: Other ODI  19/50  38%  Objective measurements identify impairments when they are compared to normal values, the uninvolved extremity, and prior level of function.  [x]  Yes  []  No  Objective assessment of functional ability: Moderate functional limitations   Briefly describe symptoms: bil back pain with radiating pain to Great toes bil R > L  How did symptoms start: lifting 28 lb grandchild 11 weeks ago  Average pain intensity:  Last 24 hours: 7/10  Past week: 7/10 to 10/10  How often does the pt experience symptoms? Constantly  How much have the symptoms interfered with usual daily activities? A little bit  How has condition changed since care began at this facility? NA - initial visit  In general, how is the patients overall health? Good   BACK PAIN (STarT Back Screening Tool) Has pain spread down the leg(s) at some time in the last 2 weeks? Yes bil LE's Has there been pain in the shoulder or neck at some time in the last  2 weeks? Yes  with rain and OA past surgery in neck Has the pt only walked short distances because of back pain? Yes Has patient dressed more slowly because of back pain in the past 2 weeks? Yes Does patient think it's not safe for a person with this condition to be physically active? yes Does patient have worrying thoughts a lot of the time? yes Does patient feel back pain is terrible and will never get any better? no Has patient stopped enjoying things they usually enjoy? Yes because I can't dance and run or walk in the park

## 2023-11-17 ENCOUNTER — Ambulatory Visit: Admitting: Physical Therapy

## 2023-11-17 DIAGNOSIS — M6281 Muscle weakness (generalized): Secondary | ICD-10-CM

## 2023-11-17 DIAGNOSIS — M5442 Lumbago with sciatica, left side: Secondary | ICD-10-CM | POA: Diagnosis not present

## 2023-11-17 DIAGNOSIS — G8929 Other chronic pain: Secondary | ICD-10-CM | POA: Diagnosis not present

## 2023-11-17 DIAGNOSIS — M5441 Lumbago with sciatica, right side: Secondary | ICD-10-CM | POA: Diagnosis not present

## 2023-11-17 DIAGNOSIS — R262 Difficulty in walking, not elsewhere classified: Secondary | ICD-10-CM

## 2023-11-17 NOTE — Patient Instructions (Addendum)
 Trigger Point Dry Needling  What is Trigger Point Dry Needling (DN)? DN is a physical therapy technique used to treat muscle pain and dysfunction. Specifically, DN helps deactivate muscle trigger points (muscle knots).  A thin filiform needle is used to penetrate the skin and stimulate the underlying trigger point. The goal is for a local twitch response (LTR) to occur and for the trigger point to relax. No medication of any kind is injected during the procedure.  Benefits of Trigger Point Dry Needling Reduces localized tension and stiffness promoting muscle function. Promotes range of motion and flexibility of the area being treated. Relieves localized and referred muscle related pain.  Promotes localized blood flow. Benefits of DN increase when performed with formal therapy including strengthening and stretching.   What Does Trigger Point Dry Needling Feel Like?  The procedure feels different for each individual patient. Some patients report that they do not actually feel the needle enter the skin and overall the process is not painful. Very mild bleeding may occur. However, many patients feel a deep cramping in the muscle in which the needle was inserted. This is the local twitch response.   How Will I feel after the treatment? Soreness is normal, and the onset of soreness may not occur for a few hours. Typically this soreness does not last longer than two days.  Bruising is uncommon, however; ice can be used to decrease any possible bruising.  In rare cases feeling tired or nauseous after the treatment is normal. In addition, your symptoms may get worse before they get better, this period will typically not last longer than 24 hours.   What Can I do After My Treatment? Increase your hydration by drinking more water for the next 24 hours.  You may place ice or heat on the areas treated that have become sore, however, do not use heat on inflamed or bruised areas. Heat often brings more  relief post needling. You can continue your regular activities, but vigorous activity is not recommended initially after the treatment for 24 hours. DN is best combined with other physical therapy such as strengthening, stretching, and other therapies.   What are the complications? While your therapist has had extensive training in minimizing the risks of trigger point dry needling, it is important to understand the risks of any procedure.  Risks include bleeding, pain, fatigue, hematoma, infection, vertigo, nausea or nerve involvement. Monitor for any changes to your skin or sensation. Contact your therapist or MD with concerns.  A rare but serious complication is a pneumothorax over or near your middle and upper chest and back If you have dry needling in this area, monitor for the following symptoms: Shortness of breath on exertion and/or Difficulty taking a deep breath and/or Chest Pain and/or A dry cough If any of the above symptoms develop, please go to the nearest emergency room or call 911. Tell them you had dry needling over your thorax and report any symptoms you are having. Please follow-up with your treating therapist after you complete the medical evaluation.    Brit PT with Nor[facility] Piedra    [phone]  Daril Berg massage therapist [phone]  Graydon Dingwall, PT, [facility] Certified Exercise Expert for the Aging Adult  11/17/23 11:42 AM Phone: [phone] Fax: [phone]

## 2023-11-21 ENCOUNTER — Encounter: Payer: Self-pay | Admitting: Cardiology

## 2023-11-21 DIAGNOSIS — R600 Localized edema: Secondary | ICD-10-CM | POA: Insufficient documentation

## 2023-11-21 DIAGNOSIS — R0789 Other chest pain: Secondary | ICD-10-CM | POA: Insufficient documentation

## 2023-11-21 NOTE — Assessment & Plan Note (Signed)
 Now back following with [facility] for otology.  She says that she is now on pilocarpine.  Will need to get echo results just to ensure there is no cardiac abnormalities associate with Sjogren's.  She is not having any heart failure symptoms.  Once we get records from [facility] [address], we can determine what else needs to be ordered.

## 2023-11-21 NOTE — Assessment & Plan Note (Addendum)
 LDL 105.  Difficult to note goal about 4 neurology for cardiac issues.  Based on the fact she had normal coronaries on cath, would suspect of the LDL goal of less than the 899 is reasonable. She is currently on atorvastatin  10 mg daily which could be increased if necessary for additional control versus switch to rosuvastatin.  Not currently on medications for diabetes with most recent A1c of 6.4.SABRA

## 2023-11-21 NOTE — Assessment & Plan Note (Addendum)
 Intermittent peripheral edema, likely exacerbated by steroid use.  Not associated PND orthopnea to suggest CHF. - Use furosemide  as needed, avoid concurrent use with hydrochlorothiazide  to prevent hypokalemia.

## 2023-11-21 NOTE — Assessment & Plan Note (Signed)
 Well-controlled with Bystolic  5 mg daily and hydrochlorothiazide  25 mg twice daily (however twice daily dosing is somewhat unusual.  Recommended if she uses furosemide  for edema that she does not do well with dose of HCTZ to avoid hypokalemia.  Otherwise continue current doses.SABRA

## 2023-11-21 NOTE — Assessment & Plan Note (Signed)
 Very atypical sounding features but sometimes symptoms having at rest but some then sometimes with exertion or certain movements.  She has clearly had ischemic evaluations in the past and I would like to see what has been done prior to considering further evaluation.  Chest pain is not consistent or persistent.

## 2023-11-22 ENCOUNTER — Ambulatory Visit: Payer: Medicare Other | Attending: Internal Medicine

## 2023-11-22 VITALS — Ht 66.0 in | Wt 213.0 lb

## 2023-11-22 DIAGNOSIS — Z0000 Encounter for general adult medical examination without abnormal findings: Secondary | ICD-10-CM | POA: Diagnosis not present

## 2023-11-22 NOTE — Patient Instructions (Signed)
 Ms. Ficek , Thank you for taking time out of your busy schedule to complete your Annual Wellness Visit with me. I enjoyed our conversation and look forward to speaking with you again next year. I, as well as your care team,  appreciate your ongoing commitment to your health goals. Please review the following plan we discussed and let me know if I can assist you in the future. Your Game plan/ To Do List    Referrals: If you haven't heard from the office you've been referred to, please reach out to them at the phone provided.   Follow up Visits: We will see or speak with you next year for your Next Medicare AWV with our clinical staff Have you seen your provider in the last 6 months (3 months if uncontrolled diabetes)? Yes  Clinician Recommendations:  Aim for 30 minutes of exercise or brisk walking, 6-8 glasses of water, and 5 servings of fruits and vegetables each day.       This is a list of the screenings recommended for you:  Health Maintenance  Topic Date Due   Eye exam for diabetics  10/07/2022   Complete foot exam   04/28/2023   Flu Shot  11/11/2023   Hemoglobin A1C  04/04/2024   Yearly kidney health urinalysis for diabetes  05/01/2024   Yearly kidney function blood test for diabetes  10/03/2024   Medicare Annual Wellness Visit  11/21/2024   Mammogram  02/08/2025   Colon Cancer Screening  05/14/2031   DTaP/Tdap/Td vaccine (3 - Td or Tdap) 01/30/2032   Pneumococcal Vaccine for age over 80  Completed   DEXA scan (bone density measurement)  Completed   Hepatitis C Screening  Completed   Zoster (Shingles) Vaccine  Completed   Hepatitis B Vaccine  Aged Out   HPV Vaccine  Aged Out   Meningitis B Vaccine  Aged Out   COVID-19 Vaccine  Discontinued    Advanced directives: (Declined) Advance directive discussed with you today. Even though you declined this today, please call our office should you change your mind, and we can give you the proper paperwork for you to fill out. Advance  Care Planning is important because it:  [x]  Makes sure you receive the medical care that is consistent with your values, goals, and preferences  [x]  It provides guidance to your family and loved ones and reduces their decisional burden about whether or not they are making the right decisions based on your wishes.  Follow the link provided in your after visit summary or read over the paperwork we have mailed to you to help you started getting your Advance Directives in place. If you need assistance in completing these, please reach out to us  so that we can help you!  See attachments for Preventive Care and Fall Prevention Tips.

## 2023-11-22 NOTE — Progress Notes (Signed)
 Because this visit was a virtual/telehealth visit,  certain criteria was not obtained, such a blood pressure, CBG if applicable, and timed get up and go. Any medications not marked as taking were not mentioned during the medication reconciliation part of the visit. Any vitals not documented were not able to be obtained due to this being a telehealth visit or patient was unable to self-report a recent blood pressure reading due to a lack of equipment at home via telehealth. Vitals that have been documented are verbally provided by the patient.   Subjective:   [name] is a 68 y.o. who presents for a Medicare Wellness preventive visit.  As a reminder, Annual Wellness Visits don't include a physical exam, and some assessments may be limited, especially if this visit is performed virtually. We may recommend an in-person follow-up visit with your provider if needed.  Visit Complete: Virtual I connected with  [name] on 11/22/23 by a audio enabled telemedicine application and verified that I am speaking with the correct person using two identifiers.  Patient Location: Home  Provider Location: Home Office  I discussed the limitations of evaluation and management by telemedicine. The patient expressed understanding and agreed to proceed.  Vital Signs: Because this visit was a virtual/telehealth visit, some criteria may be missing or patient reported. Any vitals not documented were not able to be obtained and vitals that have been documented are patient reported.  VideoDeclined- This patient declined Librarian, academic. Therefore the visit was completed with audio only.  Persons Participating in Visit: Patient.  AWV Questionnaire: No: Patient Medicare AWV questionnaire was not completed prior to this visit.  Cardiac Risk Factors include: advanced age (>82men, >36 women);sedentary lifestyle;diabetes mellitus;dyslipidemia;hypertension;obesity (BMI  >30kg/m2);family history of premature cardiovascular disease     Objective:    Today's Vitals   11/22/23 1533  Weight: 213 lb (96.6 kg)  Height: 5' 6 (1.676 m)  PainSc: 5   PainLoc: Back   Body mass index is 34.38 kg/m.     11/01/2023   11:41 AM 03/29/2023   11:18 AM 12/09/2022    2:38 PM 11/16/2022   10:40 PM 01/29/2022   11:03 AM 12/15/2021    1:06 PM  Advanced Directives  Does Patient Have a Medical Advance Directive? Yes No No No Yes Yes  Type of Estate agent of [place];Living will    Living will Healthcare Power of [place];Living will  Does patient want to make changes to medical advance directive? No - Patient declined     No - Patient declined  Copy of Healthcare Power of Attorney in Chart? No - copy requested     No - copy requested  Would patient like information on creating a medical advance directive?  No - Patient declined No - Patient declined Yes (MAU/Ambulatory/Procedural Areas - Information given)      Current Medications (verified) Outpatient Encounter Medications as of 11/22/2023  Medication Sig   acetaminophen (TYLENOL) 650 MG CR tablet Take 650 mg by mouth as needed.     albuterol  (VENTOLIN  HFA) 108 (90 Base) MCG/ACT inhaler Inhale 2 puffs into the lungs every 6 (six) hours as needed for wheezing or shortness of breath.   ascorbic acid (VITAMIN C) 500 MG tablet Take 500 mg by mouth daily.   atorvastatin  (LIPITOR) 10 MG tablet 1 tab PO Q Mon/Wed/Frid   b complex vitamins capsule Take 1 capsule by mouth daily. 1 chewable once daily.   benzonatate  (TESSALON ) 200  MG capsule Take 1 capsule by mouth three times daily as needed for cough   BIOTIN PO Take by mouth.   Calcium  500-2.5 MG-MCG CHEW Chew 2 tablets by mouth daily.   Cetirizine HCl (ZYRTEC PO) Take 10 mg by mouth.    Cholecalciferol (VITAMIN D  PO) Take by mouth. TAKES 2000    Cinnamon 500 MG TABS Take 2 tablets by mouth daily.   clindamycin  (CLEOCIN ) 300 MG capsule Take 1 capsule  (300 mg total) by mouth 3 (three) times daily.   Continuous Glucose Receiver (FREESTYLE LIBRE 3 READER) DEVI UAD   Continuous Glucose Sensor (FREESTYLE LIBRE 3 PLUS SENSOR) MISC Change sensor every 15 days.   cyclobenzaprine  (FLEXERIL ) 10 MG tablet Take 1 tablet (10 mg total) by mouth 3 (three) times daily as needed.   cycloSPORINE (RESTASIS) 0.05 % ophthalmic emulsion 1 drop 2 (two) times daily.   dexlansoprazole  (DEXILANT ) 60 MG capsule TAKE 1 TABLET BY MOUTH IN THE MORNING   diclofenac Sodium (VOLTAREN) 1 % GEL Apply 2 g topically daily. Once daily in the morning as needed.   EPINEPHrine  0.3 mg/0.3 mL IJ SOAJ injection Inject 0.3 mg into the muscle as needed for anaphylaxis.   folic acid  (FOLVITE ) 1 MG tablet Take 1 mg by mouth daily. 2 tablets daily   furosemide  (LASIX ) 20 MG tablet Take 1 tablet (20 mg total) by mouth daily as needed.   Glucose 15 g PACK Take by mouth. Chew up to 4 tablets prn.   glucose blood (FREESTYLE LITE) test strip Use as instructed   hydrochlorothiazide  (HYDRODIURIL ) 25 MG tablet Take 1 tablet (25 mg total) by mouth 2 (two) times daily.   ipratropium-albuterol  (DUONEB) 0.5-2.5 (3) MG/3ML SOLN Take 3 mLs by nebulization every 6 (six) hours as needed.   Lancets (FREESTYLE) lancets Use as instructed   leflunomide (ARAVA) 20 MG tablet Take 20 mg by mouth daily.   meclizine  (ANTIVERT ) 25 MG tablet Take 1 tablet (25 mg total) by mouth 2 (two) times daily as needed for dizziness.   meloxicam  (MOBIC ) 15 MG tablet Take 1 tablet (15 mg total) by mouth daily.   montelukast  (SINGULAIR ) 10 MG tablet Take 1 tablet (10 mg total) by mouth at bedtime.   Multiple Vitamins-Iron (MULTIVITAMIN/IRON PO) Take by mouth.     nebivolol  (BYSTOLIC ) 5 MG tablet Take 1 tablet (5 mg total) by mouth every morning.   polyethylene glycol (MIRALAX / GLYCOLAX) 17 g packet Take 17 g by mouth daily.   Povidone, PF, (IVIZIA DRY EYES) 0.5 % SOLN Apply 0.5 % to eye daily as needed.   traMADol  (ULTRAM ) 50  MG tablet Take 1 tablet (50 mg total) by mouth every 8 (eight) hours as needed.   triamcinolone (NASACORT) 55 MCG/ACT nasal inhaler Place 2 sprays into the nose as needed.     valACYclovir  (VALTREX ) 500 MG tablet Take 1 tablet (500 mg total) by mouth daily.   Vitamin D , Ergocalciferol , (DRISDOL ) 1.25 MG (50000 UNIT) CAPS capsule Take 1 capsule by mouth once a week   vitamin E 180 MG (400 UNITS) capsule Take 400 Units by mouth in the morning and at bedtime.   No facility-administered encounter medications on file as of 11/22/2023.    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  Normal   NECK SURGERY     PELVIC LAPAROSCOPY  8009,8006   PILONIDAL CYST EXCISION     SALPINGOOPHORECTOMY  10/11/2002   SCOPE LSO-LEFT SEROUS CYSTADENOMA    SALPINGOOPHORECTOMY  04/12/2005   RSO/TOA   SHOULDER SURGERY Right 01/2018   TONSILLECTOMY AND ADENOIDECTOMY     TUBAL LIGATION     VAGINAL HYSTERECTOMY  01/10/2005   [facility], POSTERIOR REPAIR   Family History  Problem Relation Age of Onset   Hypertension Mother    Heart disease Mother    Arthritis Mother    Hyperlipidemia Mother    Stroke Mother    Heart disease Father    Hypertension Father    Cancer Father        Prostate and lung   Alcohol abuse Father    Anxiety disorder Father    Cancer Paternal Grandmother        COLON CA   Breast cancer Maternal Aunt        LATE 50'S   Ovarian cancer Maternal Aunt    Social History   Socioeconomic History   Marital status: Single    Spouse name: Not on file   Number of children: Not on file   Years of education: Not on file   Highest education level: Bachelor's degree (e.g., BA, AB, BS)  Occupational History   Not on file  Tobacco Use   Smoking status: Former   Smokeless tobacco: Never  Vaping Use   Vaping status: Never Used  Substance and Sexual Activity   Alcohol use: Yes    Comment: Rare   Drug use: No   Sexual activity: Not Currently    Birth control/protection: Post-menopausal  Other Topics Concern   Not on file  Social History Narrative   - Employment: 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   - Partner Status: Married   - Living Situation: Lives in her hometown   Social Drivers of Health   Financial Resource Strain: Low Risk  (11/22/2023)   Overall Financial Resource Strain (CARDIA)    Difficulty of Paying Living Expenses: Not hard at all  Food Insecurity: No Food Insecurity (11/22/2023)   Hunger Vital Sign    Worried About Running Out of Food in the Last Year: Never true    Ran Out of Food in the Last Year: Never true  Transportation Needs: No Transportation Needs (11/22/2023)   PRAPARE - Transportation  Lack of Transportation  (Medical): No    Lack of Transportation (Non-Medical): No  Physical Activity: Inactive (11/22/2023)   Exercise Vital Sign    Days of Exercise per Week: 0 days    Minutes of Exercise per Session: 0 min  Stress: No Stress Concern Present (11/22/2023)   [employer] of Occupational Health - Occupational Stress Questionnaire    Feeling of Stress: Only a little  Social Connections: Moderately Integrated (11/22/2023)   Social Connection and Isolation Panel    Frequency of Communication with Friends and Family: More than three times a week    Frequency of Social Gatherings with Friends and Family: More than three times a week    Attends Religious Services: More than 4 times per year    Active Member of [employer] or Organizations: Yes    Attends Engineer, structural: More than 4 times per year    Marital Status: Divorced    Tobacco Counseling Counseling given: Not Answered    Clinical Intake:  Pre-visit preparation completed: Yes  Pain : 0-10 Pain Score: 5  (WHILE SITTING, WHEN WALKING ITS A 7) Pain Type: Neuropathic pain Pain Location: Back Pain Orientation: Lower Pain Radiating Towards: Bilateral Lower Extremities Pain Descriptors / Indicators: Shooting     BMI - recorded: 34.38 Nutritional Status: BMI > 30  Obese Nutritional Risks: None Diabetes: Yes CBG done?: No Did pt. bring in CBG monitor from home?: No  Lab Results  Component Value Date   HGBA1C 6.4 10/04/2023   HGBA1C 6.3 05/02/2023   HGBA1C 5.9 12/31/2022     How often do you need to have someone help you when you read instructions, pamphlets, or other written materials from your doctor or pharmacy?: 1 - Never  Interpreter Needed?: No  Information entered by :: Catherine Fuller, LPN.   Activities of Daily Living     11/22/2023    3:39 PM  In your present state of health, do you have any difficulty performing the following activities:  Hearing? 0  Vision? 0  Difficulty concentrating or making  decisions? 1  Comment Some brain fog due to dx of covid in March 2025  Walking or climbing stairs? 0  Dressing or bathing? 0  Doing errands, shopping? 0  Preparing Food and eating ? N  Using the Toilet? N  In the past six months, have you accidently leaked urine? Y  Do you have problems with loss of bowel control? N  Managing your Medications? N  Managing your Finances? N  Housekeeping or managing your Housekeeping? N    Patient Care Team: Vicci Barnie NOVAK, MD as PCP - General (Internal Medicine) Anner Alm ORN, MD as PCP - Cardiology (Cardiology) Jadine Sieving, MD as Referring Physician (Orthopedic Surgery) [facility], [facility], [facility] (Rheumatology) Robinson Mayo, OD as Referring Physician (Optometry)  I have updated your Care Teams any recent Medical Services you may have received from other providers in the past year.     Assessment:   This is a routine wellness examination for [name].  Hearing/Vision screen Hearing Screening - Comments:: Denies hearing difficulties.  Vision Screening - Comments:: Wears bifocal glasses - up to date with routine eye exams with Dr. Mayo Robinson    Goals Addressed             This Visit's Progress    11/22/2023: Prevent falls and continue to do pelvic exercises and sciatic nerve exercises.         Depression Screen  10/04/2023   10:49 AM 05/02/2023    2:05 PM 12/31/2022    1:47 PM 11/16/2022   10:37 PM 08/30/2022   10:55 AM 04/27/2022   10:23 AM 01/29/2022   11:04 AM  PHQ 2/9 Scores  PHQ - 2 Score 0 0 0 0 0 0 1  PHQ- 9 Score 7 7 2  6 5 1     Fall Risk     11/22/2023    3:36 PM 11/16/2022   10:38 PM 08/30/2022   11:07 AM 04/27/2022   10:13 AM 01/29/2022   11:04 AM  Fall Risk   Falls in the past year? 1 1  0 0  Number falls in past yr: 1 0 0 0 0  Injury with Fall? 1 1 0 0 0  Risk for fall due to : History of fall(s);Impaired balance/gait;Orthopedic patient History of fall(s) No Fall  Risks No Fall Risks   Follow up Falls evaluation completed;Education provided Falls prevention discussed;Education provided;Falls evaluation completed Falls evaluation completed  Falls evaluation completed;Education provided;Falls prevention discussed      Data saved with a previous flowsheet row definition    MEDICARE RISK AT HOME:  Medicare Risk at Home Any stairs in or around the home?: Yes (3 steps at front entrance) If so, are there any without handrails?: No Home free of loose throw rugs in walkways, pet beds, electrical cords, etc?: Yes Adequate lighting in your home to reduce risk of falls?: Yes Life alert?: No Use of a cane, walker or w/c?: Yes (prn) Grab bars in the bathroom?: Yes (small grab bar) Shower chair or bench in shower?: Yes Elevated toilet seat or a handicapped toilet?: Yes  TIMED UP AND GO:  Was the test performed?  No  Cognitive Function: Declined/Normal: No cognitive concerns noted by patient or family. Patient alert, oriented, able to answer questions appropriately and recall recent events. No signs of memory loss or confusion.    11/22/2023    3:42 PM 01/29/2022   11:09 AM  MMSE - Mini Mental State Exam  Not completed: Unable to complete   Orientation to time  5  Orientation to Place  5  Registration  3  Attention/ Calculation  5  Recall  3  Language- name 2 objects  2  Language- repeat  1  Language- follow 3 step command  3  Language- read & follow direction  1  Write a sentence  1  Copy design  1  Total score  30        11/22/2023    3:37 PM 11/16/2022   10:40 PM  6CIT Screen  What Year? 0 points 0 points  What month? 0 points 0 points  What time? 0 points 0 points  Count back from 20 0 points 0 points  Months in reverse 0 points 0 points  Repeat phrase 0 points 0 points  Total Score 0 points 0 points    Immunizations Immunization History  Administered Date(s) Administered   Fluad Trivalent(High Dose 65+) 12/31/2022   Influenza Inj  Mdck Quad With Preservative 02/26/2019   Influenza, Seasonal, Injecte, Preservative Fre 12/24/2021   Influenza,inj,Quad PF,6+ Mos 01/16/2018, 12/24/2021   Influenza,inj,quad, With Preservative 04/13/2015, 02/21/2020   PNEUMOCOCCAL CONJUGATE-20 12/24/2021   Pneumococcal Conjugate,unspecified 12/24/2021   Respiratory Syncytial Virus Vaccine ,Recomb Aduvanted(Arexvy ) 05/02/2023   Tdap 04/13/2011, 01/29/2022   Unspecified SARS-COV-2 Vaccination 06/06/2019, 07/04/2019   Zoster Recombinant(Shingrix) 04/29/2022, 08/04/2022    Screening Tests Health Maintenance  Topic Date Due   OPHTHALMOLOGY EXAM  10/07/2022   FOOT EXAM  04/28/2023   INFLUENZA VACCINE  11/11/2023   HEMOGLOBIN A1C  04/04/2024   Diabetic kidney evaluation - Urine ACR  05/01/2024   Diabetic kidney evaluation - eGFR measurement  10/03/2024   Medicare Annual Wellness (AWV)  11/21/2024   MAMMOGRAM  02/08/2025   Colonoscopy  05/14/2031   DTaP/Tdap/Td (3 - Td or Tdap) 01/30/2032   Pneumococcal Vaccine: 50+ Years  Completed   DEXA SCAN  Completed   Hepatitis C Screening  Completed   Zoster Vaccines- Shingrix  Completed   Hepatitis B Vaccines  Aged Out   HPV VACCINES  Aged Out   Meningococcal B Vaccine  Aged Out   COVID-19 Vaccine  Discontinued    Health Maintenance  Health Maintenance Due  Topic Date Due   OPHTHALMOLOGY EXAM  10/07/2022   FOOT EXAM  04/28/2023   INFLUENZA VACCINE  11/11/2023   Health Maintenance Items Addressed: Yes Patient due for Diabetic Foot Exam and Flu Vaccine in the Fall.  Additional Screening:  Vision Screening: Recommended annual ophthalmology exams for early detection of glaucoma and other disorders of the eye. Would you like a referral to an eye doctor? No    Dental Screening: Recommended annual dental exams for proper oral hygiene  Community Resource Referral / Chronic Care Management: CRR required this visit?  No   CCM required this visit?  No   Plan:    I have personally  reviewed and noted the following in the patient's chart:   Medical and social history Use of alcohol, tobacco or illicit drugs  Current medications and supplements including opioid prescriptions. Patient is not currently taking opioid prescriptions. Functional ability and status Nutritional status Physical activity Advanced directives List of other physicians Hospitalizations, surgeries, and ER visits in previous 12 months Vitals Screenings to include cognitive, depression, and falls Referrals and appointments  In addition, I have reviewed and discussed with patient certain preventive protocols, quality metrics, and best practice recommendations. A written personalized care plan for preventive services as well as general preventive health recommendations were provided to patient.   Catherine LOISE Fuller, LPN   [id number]   After Visit Summary: (MyChart) Due to this being a telephonic visit, the after visit summary with patients personalized plan was offered to patient via MyChart   Notes: This nurse will request notes from Dr. Selinda Reusing.SABRA

## 2023-11-23 ENCOUNTER — Ambulatory Visit: Admitting: Physical Therapy

## 2023-11-23 ENCOUNTER — Encounter: Payer: Self-pay | Admitting: Physical Therapy

## 2023-11-23 DIAGNOSIS — M6281 Muscle weakness (generalized): Secondary | ICD-10-CM | POA: Diagnosis not present

## 2023-11-23 DIAGNOSIS — M5441 Lumbago with sciatica, right side: Secondary | ICD-10-CM | POA: Diagnosis not present

## 2023-11-23 DIAGNOSIS — G8929 Other chronic pain: Secondary | ICD-10-CM | POA: Diagnosis not present

## 2023-11-23 DIAGNOSIS — M5442 Lumbago with sciatica, left side: Secondary | ICD-10-CM | POA: Diagnosis not present

## 2023-11-23 DIAGNOSIS — R262 Difficulty in walking, not elsewhere classified: Secondary | ICD-10-CM | POA: Diagnosis not present

## 2023-11-23 NOTE — Therapy (Signed)
 OUTPATIENT PHYSICAL THERAPY THORACOLUMBAR TREATMENT   Patient Name: [name] MRN: [id number] DOB:1956-03-30, 68 y.o., female Today's Date: 11/23/2023  END OF SESSION:  PT End of Session - 11/23/23 1019     Visit Number 5    Number of Visits 16    Date for PT Re-Evaluation 12/27/23    Authorization Type U[employer] MCR    PT Start Time 1018    PT Stop Time 1058    PT Time Calculation (min) 40 min    Activity Tolerance Patient tolerated treatment well;Patient limited by pain    Behavior During Therapy [facility] for tasks assessed/performed             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   HTN (hypertension)    Joint pain    Lactose intolerance    Multiple food allergies    OA (osteoarthritis)    Reflux    Sjogren's syndrome (HCC)    SOB (shortness of breath)    Swallowing difficulty    Thyroid  condition    Vitamin D  deficiency    Past Surgical History:  Procedure Laterality Date   APPENDECTOMY     BACK SURGERY  03/12/2010   L5   BREAST SURGERY     BIOPSY   HERNIA REPAIR     groin bilateral   LEFT HEART CATH AND CORONARY ANGIOGRAPHY  2019   [facility]: Normal coronaries   LEFT HEART CATH AND CORONARY ANGIOGRAPHY  2020   [place], [place].  Normal   NECK SURGERY     PELVIC LAPAROSCOPY  8009,8006   PILONIDAL CYST EXCISION     SALPINGOOPHORECTOMY  10/11/2002   SCOPE LSO-LEFT SEROUS CYSTADENOMA   SALPINGOOPHORECTOMY  04/12/2005   RSO/TOA   SHOULDER SURGERY Right 01/2018   TONSILLECTOMY AND ADENOIDECTOMY     TUBAL LIGATION     VAGINAL HYSTERECTOMY  01/10/2005   [facility], POSTERIOR REPAIR   Patient Active Problem List   Diagnosis Date Noted   Discomfort in chest 11/21/2023   Peripheral edema 11/21/2023   Dry mouth 06/24/2022   Joint swelling  06/24/2022   Other specified abnormal immunological findings in serum 06/24/2022   Pain in limb 06/24/2022   Insulin  resistance 05/19/2022   Low mean corpuscular hemoglobin concentration (MCHC) 05/19/2022   Other fatigue 05/05/2022   SOBOE (shortness of breath on exertion) 05/05/2022   Right foot pain 05/05/2022   Depression screen 05/05/2022   Arthritis 03/23/2022   Hyperlipidemia associated with type 2 diabetes mellitus (HCC) 12/24/2021   Essential hypertension 10/21/2021   Type 2 diabetes mellitus with hypoglycemia without coma, without long-term current use of insulin  (HCC) 10/21/2021   Primary osteoarthritis involving multiple joints 10/21/2021   Mild intermittent asthma without complication 10/21/2021   History of herpes genitalis 10/21/2021   History of gout 10/21/2021   Gastroesophageal reflux disease without esophagitis 10/21/2021   Persistent cough 04/20/2021   Chronic vertigo 12/04/2020   COVID-19 04/16/2020   Neuropathic pain 04/12/2020   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   Gout 12/19/2019   Carotid artery stenosis 11/24/2019   Multiple joint pain 11/24/2019   Allergy to food 05/05/2018   Non-toxic nodular goiter 08/31/2017   Reflux  Asthma    Sjogren syndrome with inflammatory arthritis (HCC)     PCP: Vicci Barnie NOVAK, MD   REFERRING PROVIDER: Vicci Barnie NOVAK, MD   REFERRING DIAG: [phone] (ICD-10-CM) - Lumbar radiculopathy   Rationale for Evaluation and Treatment: Rehabilitation  THERAPY DIAG:  Chronic bilateral low back pain with bilateral sciatica  Muscle weakness (generalized)  Difficulty in walking, not elsewhere classified  ONSET DATE: Aug 20, 2023  SUBJECTIVE:                                                                                                                                                                                           SUBJECTIVE STATEMENT: Pt reports that she continues to have sciatic nerve pain and low  back stiffness, particularly when she first wakes up.   EVAL I lifted my grandson when I picked him up (28 lb) I felt like I hurt my back and I am going on week 11 of my pain since that incident. I went to pelvic floor therapist , cheryl Elnor. And it was helpful. But now I know the exercises.  Pt has had several previous surgeries and admits she has not had a routine exercise for a while although she used to enjoy walking at [facility].  She is having difficulty performing household chores like washing dishes and vacuuming.  She would like to be able to exercise and reduce pain for more comfortable living and return to more active lifestyle  PERTINENT HISTORY:  DOS: 10/07/2022 Rt  triple arthrodesis (subtalar joint, the talonavicular joint, and the calcaneocuboid joint) gastrocnemius recession peroneal tendon lengthening with tibial bone marrow aspirate. Lumbar surgery in 2010 L5,  cervical surgery 2002 C 6/7 fusion, Right shoulder arthroscopy RTC 2019, DM HTN, asthma, OA, Sjogren's syndrome, RA,   PAIN:  Are you having pain? Yes: NPRS scale: at rest 5/10 and at worst 10/10 Pain location: low back bil radiating to both feet to big toes bil Pain description: numbness and tingling in Right toe. Pain in left toe Aggravating factors: Transition movements , getting in and out of bed, stepping into SUV, getting up to walking and holding on, standing for longer than 5 minutes. Can't wash dishes and mopping, vacuuming, stairs in dtr house, walking no more than 10 minutes Relieving factors: takes tylenol, heat  PRECAUTIONS: None  RED FLAGS: None   WEIGHT BEARING RESTRICTIONS: No  FALLS:  Has patient fallen in last 6 months? No  LIVING ENVIRONMENT: Lives with: lives with their family and lives alone Lives in: House/apartment Stairs: No Has following equipment at home: Single point cane  OCCUPATION: retired Child psychotherapist  PLOF:  Independent  PATIENT GOALS: Be able to pick up my grandson and to  get back to walking for exercise  NEXT MD VISIT: TBD  OBJECTIVE:  Note: Objective measures were completed at Evaluation unless otherwise noted.  DIAGNOSTIC FINDINGS:  Pt completed but not read yet. Please see medical records  PATIENT SURVEYS:  Modified Oswestry:  MODIFIED OSWESTRY DISABILITY SCALE  Date: 11-01-23 Score  Pain intensity 1 = The pain is bad, but I can manage without having to take (1) I can stand as long as I want but, it increases my pain. pain medication.  2. Personal care (washing, dressing, etc.) 0 =  I can take care of myself normally without causing increased pain.  3. Lifting 4 = I can lift only very light weights  4. Walking 1 = Pain prevents me from walking more than 1 mile.  5. Sitting 2 =  Pain prevents me from sitting more than 1 hour.  6. Standing 3 =  Pain prevents me from standing more than 1/2 hour.  7. Sleeping 3 =  Even when I take pain medication, I sleep less than 4 hours.  8. Social Life 2 = Pain prevents me from participating in more energetic activities (eg. sports, dancing).  9. Traveling 1 =  I can travel anywhere, but it increases my pain.  10. Employment/ Homemaking 2 = I can perform most of my homemaking/job duties, but pain prevents me from performing more physically stressful activities (eg, lifting, vacuuming).  Total 19/50  38%   Interpretation of scores: Score Category Description  0-20% Minimal Disability The patient can cope with most living activities. Usually no treatment is indicated apart from advice on lifting, sitting and exercise  21-40% Moderate Disability The patient experiences more pain and difficulty with sitting, lifting and standing. Travel and social life are more difficult and they may be disabled from work. Personal care, sexual activity and sleeping are not grossly affected, and the patient can usually be managed by conservative means  41-60% Severe Disability Pain remains the main problem in this group, but activities of  daily living are affected. These patients require a detailed investigation  61-80% Crippled Back pain impinges on all aspects of the patient's life. Positive intervention is required  81-100% Bed-bound  These patients are either bed-bound or exaggerating their symptoms  Bluford FORBES Zoe DELENA Karon DELENA, et al. Surgery versus conservative management of stable thoracolumbar fracture: the PRESTO feasibility RCT. [place]): [employer]; 2021 Nov. [facility] Technology Assessment, No. 25.62.) Appendix 3, Oswestry Disability Index category descriptors. Available from: FindJewelers.cz  Minimally Clinically Important Difference (MCID) = 12.8%  COGNITION: Overall cognitive status: Within functional limits for tasks assessed     SENSATION: Numbness in bil LE with pain down to bil Great toes  R > L  MUSCLE LENGTH: Hamstrings: bil tighteness about 60 degrees Thomas test:  Positive Right 20 degrees from horizontal and Left 20 degrees from horizontal   POSTURE: rounded shoulders, forward head, weight shift left, and scoliosis and obesity  PALPATION: TTP over bil SI joints r > L, also TTP over L 5, s1  LUMBAR ROM:   AROM eval  Flexion Fingertips to ankle bil *  Extension 50 % *  Right lateral flexion 25 %  Left lateral flexion 25%  Right rotation 50%  Left rotation 50%   Key: WFL = within functional limits not formally assessed, * = concordant pain, s = stiffness/stretching sensation, NT = not tested)   (Blank rows =  not tested, score listed is out of 5 possible points.  N = WNL, D = diminished, C = clear for gross weakness with myotome testing, * = concordant pain with testing)   LOWER EXTREMITY ROM:     Active  Right eval Left eval 11-09-23 R/L  Hip flexion     Hip extension     Hip abduction     Hip adduction     Hip internal rotation 23 30   Hip external rotation 45 49   Knee flexion 120 120  Left  prone 100 Right prone 112  Knee  extension     Ankle dorsiflexion 6 9   Ankle plantarflexion     Ankle inversion     Ankle eversion      Key: WFL = within functional limits not formally assessed, * = concordant pain, s = stiffness/stretching sensation, NT = not tested)    LOWER EXTREMITY MMT:    MMT Right eval Left eval  Hip flexion 4- 4  Hip extension 4- 4  Hip abduction 3+ 4-  Hip adduction    Hip internal rotation    Hip external rotation    Knee flexion 4 4+  Knee extension 4 4+  Ankle dorsiflexion 4- 4  Ankle plantarflexion    Ankle inversion    Ankle eversion    Blank rows = not tested, score listed is out of 5 possible points.  N = WNL, D = diminished, C = clear for gross weakness with myotome testing, * = concordant pain with testing)   LUMBAR SPECIAL TESTS:   SLR test R +  SLump test negative  bil SI distraction and compression + bil   FABER - negative FADIR - Negative  FUNCTIONAL TESTS:  5 times sit to stand: 21.92 sec 2 minute walk test: 309.3 ft ( norm 509 ft)  GAIT: Distance walked: 309.3 ft ( norm 509 ft) Assistive device utilized: None Level of assistance: Complete Independence Comments: Pt with wide based gait and antalgic more on R than left  Trendelenberg Rgith  TREATMENT DATE: [facility] Adult PT Treatment:                                                DATE: 11-23-23  Therapeutic Exercise: Supine Pelvic Tilt  10 x 5 sec hold With resistance with belt and foam roller LTR Bridge on ball - 5'' - 5x Piriformis stretch - 30'' x2 ea Sidelying clam - RTB - 2x10 S/L clam - RTB - 2x10 ea  Therapeutic Activity  Hip hike - 4'' step - not tolerated standing on R Step up 4'' step - not tolerated d/t back pain  [facility] Adult PT Treatment:                                                DATE: 11-16-23 Therapeutic Exercise: Hip flexion with 15 # KB  2 x 5 R and L Supine Pelvic Tilt  10 x 5 sec hold SKTC 5 x 5-10 sec hold LTR  3 reps each side- 20 hold Supine Bridge with ball  2 x 10 Manual  Therapy: STW of Vastus Lateralis and rectus IASTYM of Vastus lateralis and rectus on Left LE Contract relax  of Left quad muscle  MET of Right SI with decrease of pain Modalities Moist hot pack Therapeutic Activity: NuStep UE/LE and 6 min level 5 while taking subjective STS with15 # KB   10 x only  [facility] Adult PT Treatment:                                                DATE: 11-09-23 Therapeutic Exercise: Supine Pelvic Tilt  10 x 5 sec hold SKTC 5 x 5-10 sec hold LTR  3 reps each side- 20 hold Supine Bridge  2 x 10 Supine bridge on pball 4 x but with neuro signs of ankle thigh and calf with cramping so DC Hip flexor stretch at edge of bed with SLR resisted  Seated Hamstring Stretch   VC and TC Quad stretch prone bil Manual Therapy: STW of Vastus Lateralis and rectus IASTYM of Vastus lateralis and rectus on Left LE Contract relax of Left quad muscle    Therapeutic Activity: NuStep UE/LE and 6 min level 4 while taking subjective STS with15 # KB   4 x only Self Care: Posture sitting and standing Body mechanics and household posture     EVAL and issue HEP and explanation of condition management                                                                                                                                 PATIENT EDUCATION:  Education details: POC Explanation of findings, issue HEP, condition managment Person educated: Patient Education method: Explanation, Demonstration, Tactile cues, Verbal cues, and Handouts Education comprehension: verbalized understanding, returned demonstration, verbal cues required, tactile cues required, and needs further education  HOME EXERCISE PROGRAM: Access Code: [email or web address] URL: [email or web address] Date: 11/01/2023 Prepared by: Graydon Dingwall  Exercises - Supine Pelvic Tilt  - 1 x daily - 7 x weekly - 3 sets - 10 reps - Supine Single Knee to Chest Stretch  - 2 x daily - 7 x weekly - 1 sets - 5 reps - 10 hold -  Supine Lower Trunk Rotation  - 2 x daily - 7 x weekly - 1 sets - 5 reps - 20 hold - Supine Bridge  - 1 x daily - 7 x weekly - 2 sets - 10 reps - Seated Hamstring Stretch  - 1 x daily - 7 x weekly - 1 sets - 2 reps - 60 sec hold - Hip flexor stretch at edge of bed with SLR resisted  - 2 x daily - 7 x weekly - 1 sets - 1-2 reps - 60 hold Added 11-08-33 - Supine Figure 4 Piriformis Stretch  - 1 x daily - 7 x weekly - 1 sets - 2-3 reps - 30 sec hold - Sit to stand with sink support Movement snack  - 1  x daily - 7 x weekly - 3 sets - 10 reps - Prone Quadriceps Stretch with Strap  - 1 x daily - 7 x weekly - 1 sets - 2 reps - 60 sec hold ASSESSMENT:  CLINICAL IMPRESSION: Pt with fair tolerance today d/t pain.  Worked mainly on mat exercises in neutral and flexed positions for hip and core strengthening.  Attempted hip hike and step up which was not tolerated on R d/t immediate increase in radicular pain.  Updated HEP.  Will progress as able.    EVAL- Patient is a 69 y.o. female who was seen today for physical therapy evaluation and treatment for bil low back pain with bil radiculopathy into bil Great toes with numbness in  R> L. Pt also with TTP over bil SI with R > L.  Dechelle notes  increased LBP and constant bil  radicular pain  following injury after lifting 28 lb grandson May 2025.  Pt presents with signs and symptoms compatible with lumbar radiculopathy. Pt also has scoliosis. Pt would benefit from skilled PT for 1-2 times a week for 8 weeks to address above impariments and functional limitations and return to pain-free PLOF.   OBJECTIVE IMPAIRMENTS: decreased activity tolerance, decreased balance, decreased mobility, difficulty walking, decreased ROM, decreased strength, impaired sensation, improper body mechanics, postural dysfunction, obesity, and pain.   ACTIVITY LIMITATIONS: carrying, lifting, bending, standing, squatting, stairs, transfers, locomotion level, and caring for  others  PARTICIPATION LIMITATIONS: meal prep, cleaning, laundry, driving, community activity, church, and caring for grandchild  PERSONAL FACTORS: DOS: 10/07/2022 Rt  triple arthrodesis (subtalar joint, the talonavicular joint, and the calcaneocuboid joint) gastrocnemius recession peroneal tendon lengthening with tibial bone marrow aspirate. Lumbar surgery in 2010 L5,  cervical surgery 2002 C 6/7 fusion, Right shoulder arthroscopy RTC 2019, DM HTN, asthma, OA, Sjogren's syndrome, RA,  are also affecting patient's functional outcome.   REHAB POTENTIAL: Good  CLINICAL DECISION MAKING: Evolving/moderate complexity  EVALUATION COMPLEXITY: Moderate   GOALS: Goals reviewed with patient? Yes  SHORT TERM GOALS: Target date: 11-29-23  Independent with initial HEP Baseline:no knowledge Goal status: INITIAL  2.  Sit and stand with RT=LT wt bearing to reduce lumbar strain and allow for increased tolerance for these positions for home tasks Baseline: wt shift to left Goal status: INITIAL  3.  Demonstrate and verbalize techniques to reduce the risk of re-injury including: lifting, posture, body mechanics.  Baseline: no knowledge Goal status: INITIAL  4.  Pt will perform 5xSTS in <19.0 sec sec in order to demonstrate reduced fall risk and improved functional independence. (MCID of 2.3sec) Baseline: 21.92 Goal status: INITIAL    LONG TERM GOALS: Target date: 12-27-23  Pt will be independent with advanced HEP.  Baseline: no knowlege Goal status: INITIAL  2.  Tylisa will be able to carry grandson with good body mechanics without exacerbating pain. Amedeo is 30lb) Baseline: Pt injured herself picking up grandson in May 2025 Goal status: INITIAL  3.  Walk for 30 minutes with minimal pain in order to develop healthy habit Baseline: No routine exercise due to pain Goal status: INITIAL  4.  Pt. will show a >/= 12 pt improvement in their ODI score (MCID is 12 pts) as a proxy for functional  improvement Baseline: 19/50  38 % Goal status: INITIAL  5.  Pt.  will improve two minute walk test to 314feet with =< SPC (MCID 40 ft). Baseline: 309.3 ft  Goal status: INITIAL  6.  Pt. Will be able  to stand for 25 minutes with  pain no greater than 3/10 in order to complete household chores.   Baseline: Pt is not doing any household chores presently since injury Goal status: INITIAL  PLAN:  PT FREQUENCY: 1-2x/week  PT DURATION: 8 weeks  PLANNED INTERVENTIONS: 97164- PT Re-evaluation, 97750- Physical Performance Testing, 97110-Therapeutic exercises, 97530- Therapeutic activity, [id number]- Neuromuscular re-education, 97535- Self Care, [id number]- Manual therapy, [id number]- Gait training, [phone]- Aquatic Therapy, [phone]- Electrical stimulation (manual), [phone] (1-2 muscles), 20561 (3+ muscles)- Dry Needling, Patient/Family education, Balance training, Stair training, Taping, Joint mobilization, Spinal mobilization, Cryotherapy, and Moist heat.  PLAN FOR NEXT SESSION: Progress HEP, Pharmacist, community training for caring for grandchildren. Check LLD and SI levels   Graydon Dingwall, PT, [facility] Certified Exercise Expert for the Aging Adult  11/23/23 1:18 PM Phone: [phone] Fax: [phone]   Date of referral: 10-04-23 Referring provider: Vicci Barnie NOVAK, MD  Referring diagnosis? M54.16 (ICD-10-CM) - Lumbar radiculopathy  Treatment diagnosis? (if different than referring diagnosis) muscle weakness, and difficulty walking,   What was this (referring dx) caused by? Arthritis and Other: lifting injury(of grandchild  Lysle of Condition: Initial Onset (within last 3 months)   Laterality: Both  Current Functional Measure Score: Other ODI  19/50  38%  Objective measurements identify impairments when they are compared to normal values, the uninvolved extremity, and prior level of function.  [x]  Yes  []  No  Objective assessment of functional ability: Moderate functional  limitations   Briefly describe symptoms: bil back pain with radiating pain to Great toes bil R > L  How did symptoms start: lifting 28 lb grandchild 11 weeks ago  Average pain intensity:  Last 24 hours: 7/10  Past week: 7/10 to 10/10  How often does the pt experience symptoms? Constantly  How much have the symptoms interfered with usual daily activities? A little bit  How has condition changed since care began at this facility? NA - initial visit  In general, how is the patients overall health? Good   BACK PAIN (STarT Back Screening Tool) Has pain spread down the leg(s) at some time in the last 2 weeks? Yes bil LE's Has there been pain in the shoulder or neck at some time in the last 2 weeks? Yes with rain and OA past surgery in neck Has the pt only walked short distances because of back pain? Yes Has patient dressed more slowly because of back pain in the past 2 weeks? Yes Does patient think it's not safe for a person with this condition to be physically active? yes Does patient have worrying thoughts a lot of the time? yes Does patient feel back pain is terrible and will never get any better? no Has patient stopped enjoying things they usually enjoy? Yes because I can't dance and run or walk in the park

## 2023-12-02 ENCOUNTER — Encounter: Payer: Self-pay | Admitting: Physical Therapy

## 2023-12-02 ENCOUNTER — Ambulatory Visit: Admitting: Physical Therapy

## 2023-12-02 DIAGNOSIS — R262 Difficulty in walking, not elsewhere classified: Secondary | ICD-10-CM | POA: Diagnosis not present

## 2023-12-02 DIAGNOSIS — M5442 Lumbago with sciatica, left side: Secondary | ICD-10-CM | POA: Diagnosis not present

## 2023-12-02 DIAGNOSIS — M6281 Muscle weakness (generalized): Secondary | ICD-10-CM | POA: Diagnosis not present

## 2023-12-02 DIAGNOSIS — G8929 Other chronic pain: Secondary | ICD-10-CM

## 2023-12-02 DIAGNOSIS — M5441 Lumbago with sciatica, right side: Secondary | ICD-10-CM | POA: Diagnosis not present

## 2023-12-02 NOTE — Therapy (Signed)
 OUTPATIENT PHYSICAL THERAPY THORACOLUMBAR TREATMENT   Patient Name: [name] MRN: [id number] DOB:06-06-1955, 68 y.o., female Today's Date: 12/02/2023  END OF SESSION:  PT End of Session - 12/02/23 1015     Visit Number 6    Number of Visits 16    Date for PT Re-Evaluation 12/27/23    Authorization Type UHC MCR    PT Start Time 1015    PT Stop Time 1057    PT Time Calculation (min) 42 min    Activity Tolerance Patient tolerated treatment well;Patient limited by pain    Behavior During Therapy [facility] for tasks assessed/performed             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   HTN (hypertension)    Joint pain    Lactose intolerance    Multiple food allergies    OA (osteoarthritis)    Reflux    Sjogren's syndrome (HCC)    SOB (shortness of breath)    Swallowing difficulty    Thyroid  condition    Vitamin D  deficiency    Past Surgical History:  Procedure Laterality Date   APPENDECTOMY     BACK SURGERY  03/12/2010   L5   BREAST SURGERY     BIOPSY   HERNIA REPAIR     groin bilateral   LEFT HEART CATH AND CORONARY ANGIOGRAPHY  2019   [facility]: Normal coronaries   LEFT HEART CATH AND CORONARY ANGIOGRAPHY  2020   [place], [place].  Normal   NECK SURGERY     PELVIC LAPAROSCOPY  8009,8006   PILONIDAL CYST EXCISION     SALPINGOOPHORECTOMY  10/11/2002   SCOPE LSO-LEFT SEROUS CYSTADENOMA   SALPINGOOPHORECTOMY  04/12/2005   RSO/TOA   SHOULDER SURGERY Right 01/2018   TONSILLECTOMY AND ADENOIDECTOMY     TUBAL LIGATION     VAGINAL HYSTERECTOMY  01/10/2005   [facility], POSTERIOR REPAIR   Patient Active Problem List   Diagnosis Date Noted   Discomfort in chest 11/21/2023   Peripheral edema 11/21/2023   Dry mouth 06/24/2022   Joint swelling  06/24/2022   Other specified abnormal immunological findings in serum 06/24/2022   Pain in limb 06/24/2022   Insulin  resistance 05/19/2022   Low mean corpuscular hemoglobin concentration (MCHC) 05/19/2022   Other fatigue 05/05/2022   SOBOE (shortness of breath on exertion) 05/05/2022   Right foot pain 05/05/2022   Depression screen 05/05/2022   Arthritis 03/23/2022   Hyperlipidemia associated with type 2 diabetes mellitus (HCC) 12/24/2021   Essential hypertension 10/21/2021   Type 2 diabetes mellitus with hypoglycemia without coma, without long-term current use of insulin  (HCC) 10/21/2021   Primary osteoarthritis involving multiple joints 10/21/2021   Mild intermittent asthma without complication 10/21/2021   History of herpes genitalis 10/21/2021   History of gout 10/21/2021   Gastroesophageal reflux disease without esophagitis 10/21/2021   Persistent cough 04/20/2021   Chronic vertigo 12/04/2020   COVID-19 04/16/2020   Neuropathic pain 04/12/2020   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   Gout 12/19/2019   Carotid artery stenosis 11/24/2019   Multiple joint pain 11/24/2019   Allergy to food 05/05/2018   Non-toxic nodular goiter 08/31/2017   Reflux  Asthma    Sjogren syndrome with inflammatory arthritis (HCC)     PCP: Vicci Barnie NOVAK, MD   REFERRING PROVIDER: Vicci Barnie NOVAK, MD   REFERRING DIAG: [phone] (ICD-10-CM) - Lumbar radiculopathy   Rationale for Evaluation and Treatment: Rehabilitation  THERAPY DIAG:  Chronic bilateral low back pain with bilateral sciatica  Muscle weakness (generalized)  Difficulty in walking, not elsewhere classified  ONSET DATE: Aug 20, 2023  SUBJECTIVE:                                                                                                                                                                                           SUBJECTIVE STATEMENT: Pt reports that she is in an RA flare and has had severe pain.  She  feels that the stretching is helpful and enables her to walk, but she is having difficulty progressing.  EVAL I lifted my grandson when I picked him up (28 lb) I felt like I hurt my back and I am going on week 11 of my pain since that incident. I went to pelvic floor therapist , cheryl Elnor. And it was helpful. But now I know the exercises.  Pt has had several previous surgeries and admits she has not had a routine exercise for a while although she used to enjoy walking at [facility].  She is having difficulty performing household chores like washing dishes and vacuuming.  She would like to be able to exercise and reduce pain for more comfortable living and return to more active lifestyle  PERTINENT HISTORY:  DOS: 10/07/2022 Rt  triple arthrodesis (subtalar joint, the talonavicular joint, and the calcaneocuboid joint) gastrocnemius recession peroneal tendon lengthening with tibial bone marrow aspirate. Lumbar surgery in 2010 L5,  cervical surgery 2002 C 6/7 fusion, Right shoulder arthroscopy RTC 2019, DM HTN, asthma, OA, Sjogren's syndrome, RA,   PAIN:  Are you having pain? Yes: NPRS scale: at rest 5/10 and at worst 10/10 Pain location: low back bil radiating to both feet to big toes bil Pain description: numbness and tingling in Right toe. Pain in left toe Aggravating factors: Transition movements , getting in and out of bed, stepping into SUV, getting up to walking and holding on, standing for longer than 5 minutes. Can't wash dishes and mopping, vacuuming, stairs in dtr house, walking no more than 10 minutes Relieving factors: takes tylenol, heat  PRECAUTIONS: None  RED FLAGS: None   WEIGHT BEARING RESTRICTIONS: No  FALLS:  Has patient fallen in last 6 months? No  LIVING ENVIRONMENT: Lives with: lives with their family and lives alone Lives in: House/apartment Stairs: No Has following equipment  at home: Single point cane  OCCUPATION: retired Child psychotherapist  PLOF:  Independent  PATIENT GOALS: Be able to pick up my grandson and to get back to walking for exercise  NEXT MD VISIT: TBD  OBJECTIVE:  Note: Objective measures were completed at Evaluation unless otherwise noted.  DIAGNOSTIC FINDINGS:  Pt completed but not read yet. Please see medical records  PATIENT SURVEYS:  Modified Oswestry:  MODIFIED OSWESTRY DISABILITY SCALE  Date: 11-01-23 Score  Pain intensity 1 = The pain is bad, but I can manage without having to take (1) I can stand as long as I want but, it increases my pain. pain medication.  2. Personal care (washing, dressing, etc.) 0 =  I can take care of myself normally without causing increased pain.  3. Lifting 4 = I can lift only very light weights  4. Walking 1 = Pain prevents me from walking more than 1 mile.  5. Sitting 2 =  Pain prevents me from sitting more than 1 hour.  6. Standing 3 =  Pain prevents me from standing more than 1/2 hour.  7. Sleeping 3 =  Even when I take pain medication, I sleep less than 4 hours.  8. Social Life 2 = Pain prevents me from participating in more energetic activities (eg. sports, dancing).  9. Traveling 1 =  I can travel anywhere, but it increases my pain.  10. Employment/ Homemaking 2 = I can perform most of my homemaking/job duties, but pain prevents me from performing more physically stressful activities (eg, lifting, vacuuming).  Total 19/50  38%   Interpretation of scores: Score Category Description  0-20% Minimal Disability The patient can cope with most living activities. Usually no treatment is indicated apart from advice on lifting, sitting and exercise  21-40% Moderate Disability The patient experiences more pain and difficulty with sitting, lifting and standing. Travel and social life are more difficult and they may be disabled from work. Personal care, sexual activity and sleeping are not grossly affected, and the patient can usually be managed by conservative means  41-60% Severe  Disability Pain remains the main problem in this group, but activities of daily living are affected. These patients require a detailed investigation  61-80% Crippled Back pain impinges on all aspects of the patient's life. Positive intervention is required  81-100% Bed-bound  These patients are either bed-bound or exaggerating their symptoms  Bluford FORBES Zoe DELENA Karon DELENA, et al. Surgery versus conservative management of stable thoracolumbar fracture: the PRESTO feasibility RCT. [place] ([place]): [employer]; 2021 Nov. [facility] Technology Assessment, No. 25.62.) Appendix 3, Oswestry Disability Index category descriptors. Available from: FindJewelers.cz  Minimally Clinically Important Difference (MCID) = 12.8%  COGNITION: Overall cognitive status: Within functional limits for tasks assessed     SENSATION: Numbness in bil LE with pain down to bil Great toes  R > L  MUSCLE LENGTH: Hamstrings: bil tighteness about 60 degrees Thomas test:  Positive Right 20 degrees from horizontal and Left 20 degrees from horizontal   POSTURE: rounded shoulders, forward head, weight shift left, and scoliosis and obesity  PALPATION: TTP over bil SI joints r > L, also TTP over L 5, s1  LUMBAR ROM:   AROM eval  Flexion Fingertips to ankle bil *  Extension 50 % *  Right lateral flexion 25 %  Left lateral flexion 25%  Right rotation 50%  Left rotation 50%   Key: WFL = within functional limits not formally assessed, * = concordant pain,  s = stiffness/stretching sensation, NT = not tested)   (Blank rows = not tested, score listed is out of 5 possible points.  N = WNL, D = diminished, C = clear for gross weakness with myotome testing, * = concordant pain with testing)   LOWER EXTREMITY ROM:     Active  Right eval Left eval 11-09-23 R/L  Hip flexion     Hip extension     Hip abduction     Hip adduction     Hip internal rotation 23 30   Hip external rotation 45  49   Knee flexion 120 120  Left  prone 100 Right prone 112  Knee extension     Ankle dorsiflexion 6 9   Ankle plantarflexion     Ankle inversion     Ankle eversion      Key: WFL = within functional limits not formally assessed, * = concordant pain, s = stiffness/stretching sensation, NT = not tested)    LOWER EXTREMITY MMT:    MMT Right eval Left eval  Hip flexion 4- 4  Hip extension 4- 4  Hip abduction 3+ 4-  Hip adduction    Hip internal rotation    Hip external rotation    Knee flexion 4 4+  Knee extension 4 4+  Ankle dorsiflexion 4- 4  Ankle plantarflexion    Ankle inversion    Ankle eversion    Blank rows = not tested, score listed is out of 5 possible points.  N = WNL, D = diminished, C = clear for gross weakness with myotome testing, * = concordant pain with testing)   LUMBAR SPECIAL TESTS:   SLR test R +  SLump test negative  bil SI distraction and compression + bil   FABER - negative FADIR - Negative  FUNCTIONAL TESTS:  5 times sit to stand: 21.92 sec 2 minute walk test: 309.3 ft ( norm 509 ft)  GAIT: Distance walked: 309.3 ft ( norm 509 ft) Assistive device utilized: None Level of assistance: Complete Independence Comments: Pt with wide based gait and antalgic more on R than left  Trendelenberg Rgith  TREATMENT DATE: [facility] Adult PT Treatment:                                                DATE: 12-02-23  Therapeutic Exercise: Supine Pelvic Tilt  10 x 5 sec hold Pelvic tilt with : Alternating green clam Hip adduction LTR SAQ - 4# - 3x10 HS curl - GTB - 2x10 ea   [facility] Adult PT Treatment:                                                DATE: 11-16-23 Therapeutic Exercise: Hip flexion with 15 # KB  2 x 5 R and L Supine Pelvic Tilt  10 x 5 sec hold SKTC 5 x 5-10 sec hold LTR  3 reps each side- 20 hold Supine Bridge with ball  2 x 10 Manual Therapy: STW of Vastus Lateralis and rectus IASTYM of Vastus lateralis and rectus on Left LE Contract relax  of Left quad muscle  MET of Right SI with decrease of pain Modalities Moist hot pack Therapeutic Activity: NuStep UE/LE and 6  min level 5 while taking subjective STS with15 # KB   10 x only  [facility] Adult PT Treatment:                                                DATE: 11-09-23 Therapeutic Exercise: Supine Pelvic Tilt  10 x 5 sec hold SKTC 5 x 5-10 sec hold LTR  3 reps each side- 20 hold Supine Bridge  2 x 10 Supine bridge on pball 4 x but with neuro signs of ankle thigh and calf with cramping so DC Hip flexor stretch at edge of bed with SLR resisted  Seated Hamstring Stretch   VC and TC Quad stretch prone bil Manual Therapy: STW of Vastus Lateralis and rectus IASTYM of Vastus lateralis and rectus on Left LE Contract relax of Left quad muscle    Therapeutic Activity: NuStep UE/LE and 6 min level 4 while taking subjective STS with15 # KB   4 x only Self Care: Posture sitting and standing Body mechanics and household posture     EVAL and issue HEP and explanation of condition management                                                                                                                                 PATIENT EDUCATION:  Education details: POC Explanation of findings, issue HEP, condition managment Person educated: Patient Education method: Explanation, Demonstration, Tactile cues, Verbal cues, and Handouts Education comprehension: verbalized understanding, returned demonstration, verbal cues required, tactile cues required, and needs further education  HOME EXERCISE PROGRAM: Access Code: [email or web address] URL: [email or web address] Date: 11/01/2023 Prepared by: Graydon Dingwall  Exercises - Supine Pelvic Tilt  - 1 x daily - 7 x weekly - 3 sets - 10 reps - Supine Single Knee to Chest Stretch  - 2 x daily - 7 x weekly - 1 sets - 5 reps - 10 hold - Supine Lower Trunk Rotation  - 2 x daily - 7 x weekly - 1 sets - 5 reps - 20 hold - Supine Bridge  - 1 x  daily - 7 x weekly - 2 sets - 10 reps - Seated Hamstring Stretch  - 1 x daily - 7 x weekly - 1 sets - 2 reps - 60 sec hold - Hip flexor stretch at edge of bed with SLR resisted  - 2 x daily - 7 x weekly - 1 sets - 1-2 reps - 60 hold Added 11-08-33 - Supine Figure 4 Piriformis Stretch  - 1 x daily - 7 x weekly - 1 sets - 2-3 reps - 30 sec hold - Sit to stand with sink support Movement snack  - 1 x daily - 7 x weekly - 3 sets - 10 reps - Prone Quadriceps Stretch with Strap  - 1 x daily -  7 x weekly - 1 sets - 2 reps - 60 sec hold ASSESSMENT:  CLINICAL IMPRESSION: Pt with fair tolerance today d/t pain.  Pt with continued high levels of pain which is worse in w/b.  Se reports her pain and weakness is slowly getting worse.  I encouraged her to follow up with MD about worsening sxs.  Will trial aquatics following injections next week.    EVAL- Patient is a 68 y.o. female who was seen today for physical therapy evaluation and treatment for bil low back pain with bil radiculopathy into bil Great toes with numbness in  R> L. Pt also with TTP over bil SI with R > L.  Laqueena notes  increased LBP and constant bil  radicular pain  following injury after lifting 28 lb grandson May 2025.  Pt presents with signs and symptoms compatible with lumbar radiculopathy. Pt also has scoliosis. Pt would benefit from skilled PT for 1-2 times a week for 8 weeks to address above impariments and functional limitations and return to pain-free PLOF.   OBJECTIVE IMPAIRMENTS: decreased activity tolerance, decreased balance, decreased mobility, difficulty walking, decreased ROM, decreased strength, impaired sensation, improper body mechanics, postural dysfunction, obesity, and pain.   ACTIVITY LIMITATIONS: carrying, lifting, bending, standing, squatting, stairs, transfers, locomotion level, and caring for others  PARTICIPATION LIMITATIONS: meal prep, cleaning, laundry, driving, community activity, church, and caring for  grandchild  PERSONAL FACTORS: DOS: 10/07/2022 Rt  triple arthrodesis (subtalar joint, the talonavicular joint, and the calcaneocuboid joint) gastrocnemius recession peroneal tendon lengthening with tibial bone marrow aspirate. Lumbar surgery in 2010 L5,  cervical surgery 2002 C 6/7 fusion, Right shoulder arthroscopy RTC 2019, DM HTN, asthma, OA, Sjogren's syndrome, RA,  are also affecting patient's functional outcome.   REHAB POTENTIAL: Good  CLINICAL DECISION MAKING: Evolving/moderate complexity  EVALUATION COMPLEXITY: Moderate   GOALS: Goals reviewed with patient? Yes  SHORT TERM GOALS: Target date: 11-29-23  Independent with initial HEP Baseline:no knowledge Goal status: INITIAL  2.  Sit and stand with RT=LT wt bearing to reduce lumbar strain and allow for increased tolerance for these positions for home tasks Baseline: wt shift to left 8/22: continued shift d/t  Goal status: Ongoing  3.  Demonstrate and verbalize techniques to reduce the risk of re-injury including: lifting, posture, body mechanics.  Baseline: no knowledge 8/22: MET Goal status: MET  4.  Pt will perform 5xSTS in <19.0 sec sec in order to demonstrate reduced fall risk and improved functional independence. (MCID of 2.3sec) Baseline: 21.92 8/22: not tested d/t increased pain d/t RA flare Goal status: ongoing    LONG TERM GOALS: Target date: 12-27-23  Pt will be independent with advanced HEP.  Baseline: no knowlege Goal status: INITIAL  2.  Chinyere will be able to carry grandson with good body mechanics without exacerbating pain. Amedeo is 30lb) Baseline: Pt injured herself picking up grandson in May 2025 Goal status: INITIAL  3.  Walk for 30 minutes with minimal pain in order to develop healthy habit Baseline: No routine exercise due to pain Goal status: INITIAL  4.  Pt. will show a >/= 12 pt improvement in their ODI score (MCID is 12 pts) as a proxy for functional improvement Baseline: 19/50  38  % Goal status: INITIAL  5.  Pt.  will improve two minute walk test to 391feet with =< SPC (MCID 40 ft). Baseline: 309.3 ft  Goal status: INITIAL  6.  Pt. Will be able to stand for 25 minutes with  pain no greater than 3/10 in order to complete household chores.   Baseline: Pt is not doing any household chores presently since injury Goal status: INITIAL  PLAN:  PT FREQUENCY: 1-2x/week  PT DURATION: 8 weeks  PLANNED INTERVENTIONS: 97164- PT Re-evaluation, 97750- Physical Performance Testing, 97110-Therapeutic exercises, 97530- Therapeutic activity, [id number]- Neuromuscular re-education, 97535- Self Care, [id number]- Manual therapy, [id number]- Gait training, [phone]- Aquatic Therapy, [phone]- Electrical stimulation (manual), [phone] (1-2 muscles), 20561 (3+ muscles)- Dry Needling, Patient/Family education, Balance training, Stair training, Taping, Joint mobilization, Spinal mobilization, Cryotherapy, and Moist heat.  PLAN FOR NEXT SESSION: Progress HEP, Pharmacist, community training for caring for grandchildren. Check LLD and SI levels   Helene FORBES Gasmen PT 12/02/23 11:00 AM Phone: [phone] Fax: [phone]   Date of referral: 10-04-23 Referring provider: Vicci Barnie NOVAK, MD  Referring diagnosis? M54.16 (ICD-10-CM) - Lumbar radiculopathy  Treatment diagnosis? (if different than referring diagnosis) muscle weakness, and difficulty walking,   What was this (referring dx) caused by? Arthritis and Other: lifting injury(of grandchild  Lysle of Condition: Initial Onset (within last 3 months)   Laterality: Both  Current Functional Measure Score: Other ODI  19/50  38%  Objective measurements identify impairments when they are compared to normal values, the uninvolved extremity, and prior level of function.  [x]  Yes  []  No  Objective assessment of functional ability: Moderate functional limitations   Briefly describe symptoms: bil back pain with radiating pain to Great toes bil R > L  How  did symptoms start: lifting 28 lb grandchild 11 weeks ago  Average pain intensity:  Last 24 hours: 7/10  Past week: 7/10 to 10/10  How often does the pt experience symptoms? Constantly  How much have the symptoms interfered with usual daily activities? A little bit  How has condition changed since care began at this facility? NA - initial visit  In general, how is the patients overall health? Good   BACK PAIN (STarT Back Screening Tool) Has pain spread down the leg(s) at some time in the last 2 weeks? Yes bil LE's Has there been pain in the shoulder or neck at some time in the last 2 weeks? Yes with rain and OA past surgery in neck Has the pt only walked short distances because of back pain? Yes Has patient dressed more slowly because of back pain in the past 2 weeks? Yes Does patient think it's not safe for a person with this condition to be physically active? yes Does patient have worrying thoughts a lot of the time? yes Does patient feel back pain is terrible and will never get any better? no Has patient stopped enjoying things they usually enjoy? Yes because I can't dance and run or walk in the park

## 2023-12-06 DIAGNOSIS — M5416 Radiculopathy, lumbar region: Secondary | ICD-10-CM | POA: Diagnosis not present

## 2023-12-08 ENCOUNTER — Other Ambulatory Visit: Payer: Self-pay | Admitting: Internal Medicine

## 2023-12-08 DIAGNOSIS — K219 Gastro-esophageal reflux disease without esophagitis: Secondary | ICD-10-CM

## 2023-12-14 DIAGNOSIS — M109 Gout, unspecified: Secondary | ICD-10-CM | POA: Diagnosis not present

## 2023-12-14 DIAGNOSIS — M2560 Stiffness of unspecified joint, not elsewhere classified: Secondary | ICD-10-CM | POA: Diagnosis not present

## 2023-12-14 DIAGNOSIS — M3505 Sjogren syndrome with inflammatory arthritis: Secondary | ICD-10-CM | POA: Diagnosis not present

## 2023-12-14 DIAGNOSIS — M1991 Primary osteoarthritis, unspecified site: Secondary | ICD-10-CM | POA: Diagnosis not present

## 2023-12-14 DIAGNOSIS — M2540 Effusion, unspecified joint: Secondary | ICD-10-CM | POA: Diagnosis not present

## 2023-12-14 DIAGNOSIS — M0609 Rheumatoid arthritis without rheumatoid factor, multiple sites: Secondary | ICD-10-CM | POA: Diagnosis not present

## 2023-12-21 ENCOUNTER — Encounter: Payer: Self-pay | Admitting: Physical Therapy

## 2023-12-21 ENCOUNTER — Ambulatory Visit: Attending: Internal Medicine | Admitting: Physical Therapy

## 2023-12-21 DIAGNOSIS — M6281 Muscle weakness (generalized): Secondary | ICD-10-CM | POA: Diagnosis not present

## 2023-12-21 DIAGNOSIS — R262 Difficulty in walking, not elsewhere classified: Secondary | ICD-10-CM | POA: Insufficient documentation

## 2023-12-21 DIAGNOSIS — M5442 Lumbago with sciatica, left side: Secondary | ICD-10-CM | POA: Diagnosis not present

## 2023-12-21 DIAGNOSIS — G8929 Other chronic pain: Secondary | ICD-10-CM | POA: Diagnosis not present

## 2023-12-21 DIAGNOSIS — M5441 Lumbago with sciatica, right side: Secondary | ICD-10-CM | POA: Diagnosis not present

## 2023-12-21 NOTE — Therapy (Signed)
 OUTPATIENT PHYSICAL THERAPY THORACOLUMBAR TREATMENT   Patient Name: [name] MRN: [id number] DOB:[birth year], 68 y.o., female Today's Date: 12/21/2023  END OF SESSION:  PT End of Session - 12/21/23 1501     Visit Number 7    Number of Visits 16    Date for PT Re-Evaluation 12/27/23    Authorization Type UHC MCR    PT Start Time 1500    PT Stop Time 1542    PT Time Calculation (min) 42 min    Activity Tolerance Patient tolerated treatment well;Patient limited by pain    Behavior During Therapy [facility] for tasks assessed/performed              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: Normal coronaries   LEFT HEART CATH AND CORONARY ANGIOGRAPHY  2020   [place], [place].  Normal   NECK SURGERY     PELVIC LAPAROSCOPY  8009,8006   PILONIDAL CYST EXCISION     SALPINGOOPHORECTOMY  10/11/2002   SCOPE LSO-LEFT SEROUS CYSTADENOMA   SALPINGOOPHORECTOMY  04/12/2005   RSO/TOA   SHOULDER SURGERY Right 01/2018   TONSILLECTOMY AND ADENOIDECTOMY     TUBAL LIGATION     VAGINAL HYSTERECTOMY  01/10/2005   [facility], POSTERIOR REPAIR   Patient Active Problem List   Diagnosis Date Noted   Discomfort in chest 11/21/2023   Peripheral edema 11/21/2023   Dry mouth 06/24/2022   Joint swelling  06/24/2022   Other specified abnormal immunological findings in serum 06/24/2022   Pain in limb 06/24/2022   Insulin  resistance 05/19/2022   Low mean corpuscular hemoglobin concentration (MCHC) 05/19/2022   Other fatigue 05/05/2022   SOBOE (shortness of breath on exertion) 05/05/2022   Right foot pain 05/05/2022   Depression screen 05/05/2022   Arthritis 03/23/2022   Hyperlipidemia associated with type 2 diabetes mellitus (HCC) 12/24/2021   Essential hypertension 10/21/2021   Type 2 diabetes mellitus with hypoglycemia without coma, without long-term current use of insulin  (HCC) 10/21/2021   Primary osteoarthritis involving multiple joints 10/21/2021   Mild intermittent asthma without complication 10/21/2021   History of herpes genitalis 10/21/2021   History of gout 10/21/2021   Gastroesophageal reflux disease without esophagitis 10/21/2021   Persistent cough 04/20/2021   Chronic vertigo 12/04/2020   COVID-19 04/16/2020   Neuropathic pain 04/12/2020   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   Gout 12/19/2019   Carotid artery stenosis 11/24/2019   Multiple joint pain 11/24/2019   Allergy to food 05/05/2018   Non-toxic nodular goiter 08/31/2017   Reflux  Asthma    Sjogren syndrome with inflammatory arthritis (HCC)     PCP: Vicci Barnie NOVAK, MD   REFERRING PROVIDER: Vicci Barnie NOVAK, MD   REFERRING DIAG: [id number] (ICD-10-CM) - Lumbar radiculopathy   Rationale for Evaluation and Treatment: Rehabilitation  THERAPY DIAG:  Chronic bilateral low back pain with bilateral sciatica  Muscle weakness (generalized)  Difficulty in walking, not elsewhere classified  ONSET DATE: Aug 20, 2023  SUBJECTIVE:                                                                                                                                                                                           SUBJECTIVE STATEMENT: Pt reports that she had ESI which was helpful for about 1.5 days.   She will undergo additional injection in the coming weeks.  Continues to have severe bil leg pain.  EVAL I lifted my grandson when I picked him up (28 lb) I felt like I hurt my back and I am going on week 11 of my pain since that incident. I went to pelvic floor therapist , cheryl Elnor. And it was helpful. But now I know the exercises.  Pt has had several previous surgeries and admits she has not had a routine exercise for a while although she used to enjoy walking at [facility].  She is having difficulty performing household chores like washing dishes and vacuuming.  She would like to be able to exercise and reduce pain for more comfortable living and return to more active lifestyle  PERTINENT HISTORY:  DOS: 10/07/2022 Rt  triple arthrodesis (subtalar joint, the talonavicular joint, and the calcaneocuboid joint) gastrocnemius recession peroneal tendon lengthening with tibial bone marrow aspirate. Lumbar surgery in 2010 L5,  cervical surgery 2002 C 6/7 fusion, Right shoulder arthroscopy RTC 2019, DM HTN, asthma, OA, Sjogren's syndrome, RA,   PAIN:  Are you having pain? Yes: NPRS scale: at rest 5/10 and at worst 10/10 Pain location: low back bil radiating to both feet to big toes bil Pain description: numbness and tingling in Right toe. Pain in left toe Aggravating factors: Transition movements , getting in and out of bed, stepping into SUV, getting up to walking and holding on, standing for longer than 5 minutes. Can't wash dishes and mopping, vacuuming, stairs in dtr house, walking no more than 10 minutes Relieving factors: takes tylenol, heat  PRECAUTIONS: None  RED FLAGS: None   WEIGHT BEARING RESTRICTIONS: No  FALLS:  Has patient fallen in last 6 months? No  LIVING ENVIRONMENT: Lives with: lives with their family and lives alone Lives in: House/apartment Stairs: No Has following equipment at home:  Single point cane  OCCUPATION: retired Child psychotherapist  PLOF: Independent  PATIENT  GOALS: Be able to pick up my grandson and to get back to walking for exercise  NEXT MD VISIT: TBD  OBJECTIVE:  Note: Objective measures were completed at Evaluation unless otherwise noted.  DIAGNOSTIC FINDINGS:  Pt completed but not read yet. Please see medical records  PATIENT SURVEYS:  Modified Oswestry:  MODIFIED OSWESTRY DISABILITY SCALE  Date: 11-01-23 Score  Pain intensity 1 = The pain is bad, but I can manage without having to take (1) I can stand as long as I want but, it increases my pain. pain medication.  2. Personal care (washing, dressing, etc.) 0 =  I can take care of myself normally without causing increased pain.  3. Lifting 4 = I can lift only very light weights  4. Walking 1 = Pain prevents me from walking more than 1 mile.  5. Sitting 2 =  Pain prevents me from sitting more than 1 hour.  6. Standing 3 =  Pain prevents me from standing more than 1/2 hour.  7. Sleeping 3 =  Even when I take pain medication, I sleep less than 4 hours.  8. Social Life 2 = Pain prevents me from participating in more energetic activities (eg. sports, dancing).  9. Traveling 1 =  I can travel anywhere, but it increases my pain.  10. Employment/ Homemaking 2 = I can perform most of my homemaking/job duties, but pain prevents me from performing more physically stressful activities (eg, lifting, vacuuming).  Total 19/50  38%   Interpretation of scores: Score Category Description  0-20% Minimal Disability The patient can cope with most living activities. Usually no treatment is indicated apart from advice on lifting, sitting and exercise  21-40% Moderate Disability The patient experiences more pain and difficulty with sitting, lifting and standing. Travel and social life are more difficult and they may be disabled from work. Personal care, sexual activity and sleeping are not grossly affected, and the patient can usually be managed by conservative means  41-60% Severe Disability Pain remains the  main problem in this group, but activities of daily living are affected. These patients require a detailed investigation  61-80% Crippled Back pain impinges on all aspects of the patient's life. Positive intervention is required  81-100% Bed-bound  These patients are either bed-bound or exaggerating their symptoms  Bluford FORBES Zoe DELENA Karon DELENA, et al. Surgery versus conservative management of stable thoracolumbar fracture: the PRESTO feasibility RCT. [place] ([place]): [employer]; 2021 Nov. [facility] Technology Assessment, No. 25.62.) Appendix 3, Oswestry Disability Index category descriptors. Available from: FindJewelers.cz  Minimally Clinically Important Difference (MCID) = 12.8%  COGNITION: Overall cognitive status: Within functional limits for tasks assessed     SENSATION: Numbness in bil LE with pain down to bil Great toes  R > L  MUSCLE LENGTH: Hamstrings: bil tighteness about 60 degrees Thomas test:  Positive Right 20 degrees from horizontal and Left 20 degrees from horizontal   POSTURE: rounded shoulders, forward head, weight shift left, and scoliosis and obesity  PALPATION: TTP over bil SI joints r > L, also TTP over L 5, s1  LUMBAR ROM:   AROM eval  Flexion Fingertips to ankle bil *  Extension 50 % *  Right lateral flexion 25 %  Left lateral flexion 25%  Right rotation 50%  Left rotation 50%   Key: WFL = within functional limits not formally assessed, * = concordant pain, s =  stiffness/stretching sensation, NT = not tested)   (Blank rows = not tested, score listed is out of 5 possible points.  N = WNL, D = diminished, C = clear for gross weakness with myotome testing, * = concordant pain with testing)   LOWER EXTREMITY ROM:     Active  Right eval Left eval 11-09-23 R/L  Hip flexion     Hip extension     Hip abduction     Hip adduction     Hip internal rotation 23 30   Hip external rotation 45 49   Knee flexion 120 120   Left  prone 100 Right prone 112  Knee extension     Ankle dorsiflexion 6 9   Ankle plantarflexion     Ankle inversion     Ankle eversion      Key: WFL = within functional limits not formally assessed, * = concordant pain, s = stiffness/stretching sensation, NT = not tested)    LOWER EXTREMITY MMT:    MMT Right eval Left eval  Hip flexion 4- 4  Hip extension 4- 4  Hip abduction 3+ 4-  Hip adduction    Hip internal rotation    Hip external rotation    Knee flexion 4 4+  Knee extension 4 4+  Ankle dorsiflexion 4- 4  Ankle plantarflexion    Ankle inversion    Ankle eversion    Blank rows = not tested, score listed is out of 5 possible points.  N = WNL, D = diminished, C = clear for gross weakness with myotome testing, * = concordant pain with testing)   LUMBAR SPECIAL TESTS:   SLR test R +  SLump test negative  bil SI distraction and compression + bil   FABER - negative FADIR - Negative  FUNCTIONAL TESTS:  5 times sit to stand: 21.92 sec 2 minute walk test: 309.3 ft ( norm 509 ft)  GAIT: Distance walked: 309.3 ft ( norm 509 ft) Assistive device utilized: None Level of assistance: Complete Independence Comments: Pt with wide based gait and antalgic more on R than left  Trendelenberg Rgith  TREATMENT DATE:  TREATMENT 12/21/23:  Aquatic therapy at [facility]- [address] - therapeutic pool temp 92 degrees Pt enters building independently.  Treatment took place in water 3.8 to  4 ft 8 in.feet deep depending upon activity.  Pt entered and exited the pool via stair and handrails    Aquatic Therapy:  Water walking for warm up fwd/lat/bkwds  Side stepping with shoulder adduction Marching with bil dumbells Hip circles EOP Mini squats EOP Green noodle stomp HS curls EOP Heel raises EOP  Pt requires the buoyancy of water for active assisted exercises with buoyancy supported for strengthening and AROM exercises. Hydrostatic pressure also supports joints by  unweighting joint load by at least 50 % in 3-4 feet depth water. 80% in chest to neck deep water. Water will provide assistance with movement using the current and laminar flow while the buoyancy reduces weight bearing. Pt requires the viscosity of the water for resistance with strengthening exercises.  [facility] Adult PT Treatment:                                                DATE: 12-02-23  Therapeutic Exercise: Supine Pelvic Tilt  10 x 5 sec hold Pelvic tilt with : Alternating  green clam Hip adduction LTR SAQ - 4# - 3x10 HS curl - GTB - 2x10 ea   [facility] Adult PT Treatment:                                                DATE: 11-16-23 Therapeutic Exercise: Hip flexion with 15 # KB  2 x 5 R and L Supine Pelvic Tilt  10 x 5 sec hold SKTC 5 x 5-10 sec hold LTR  3 reps each side- 20 hold Supine Bridge with ball  2 x 10 Manual Therapy: STW of Vastus Lateralis and rectus IASTYM of Vastus lateralis and rectus on Left LE Contract relax of Left quad muscle  MET of Right SI with decrease of pain Modalities Moist hot pack Therapeutic Activity: NuStep UE/LE and 6 min level 5 while taking subjective STS with15 # KB   10 x only  [facility] Adult PT Treatment:                                                DATE: 11-09-23 Therapeutic Exercise: Supine Pelvic Tilt  10 x 5 sec hold SKTC 5 x 5-10 sec hold LTR  3 reps each side- 20 hold Supine Bridge  2 x 10 Supine bridge on pball 4 x but with neuro signs of ankle thigh and calf with cramping so DC Hip flexor stretch at edge of bed with SLR resisted  Seated Hamstring Stretch   VC and TC Quad stretch prone bil Manual Therapy: STW of Vastus Lateralis and rectus IASTYM of Vastus lateralis and rectus on Left LE Contract relax of Left quad muscle    Therapeutic Activity: NuStep UE/LE and 6 min level 4 while taking subjective STS with15 # KB   4 x only Self Care: Posture sitting and standing Body mechanics and household posture     EVAL and issue HEP  and explanation of condition management                                                                                                                                 PATIENT EDUCATION:  Education details: POC Explanation of findings, issue HEP, condition managment Person educated: Patient Education method: Explanation, Demonstration, Tactile cues, Verbal cues, and Handouts Education comprehension: verbalized understanding, returned demonstration, verbal cues required, tactile cues required, and needs further education  HOME EXERCISE PROGRAM: Access Code: [email or web address] URL: [email or web address] Date: 11/01/2023 Prepared by: Graydon Dingwall  Exercises - Supine Pelvic Tilt  - 1 x daily - 7 x weekly - 3 sets - 10 reps - Supine Single Knee to Chest Stretch  - 2 x daily - 7 x weekly - 1 sets -  5 reps - 10 hold - Supine Lower Trunk Rotation  - 2 x daily - 7 x weekly - 1 sets - 5 reps - 20 hold - Supine Bridge  - 1 x daily - 7 x weekly - 2 sets - 10 reps - Seated Hamstring Stretch  - 1 x daily - 7 x weekly - 1 sets - 2 reps - 60 sec hold - Hip flexor stretch at edge of bed with SLR resisted  - 2 x daily - 7 x weekly - 1 sets - 1-2 reps - 60 hold Added 11-08-33 - Supine Figure 4 Piriformis Stretch  - 1 x daily - 7 x weekly - 1 sets - 2-3 reps - 30 sec hold - Sit to stand with sink support Movement snack  - 1 x daily - 7 x weekly - 3 sets - 10 reps - Prone Quadriceps Stretch with Strap  - 1 x daily - 7 x weekly - 1 sets - 2 reps - 60 sec hold ASSESSMENT:  CLINICAL IMPRESSION:  Session today focused on hip and core strengthening in the aquatic environment for use of buoyancy to offload joints and the viscosity of water as resistance during therapeutic exercise.  Pt with continue high levels of LE pain, though injections did help low back pain a bit.  Kept exercise intensity low to avoid agging and will gauge response.  Recommended [facility] membership through [employer].  Patient was  able to tolerate all prescribed exercises in the aquatic environment with no adverse effects and reports no increase in pain at the end of the session. Patient continues to benefit from skilled PT services on land and aquatic based and should be progressed as able to improve functional independence.    EVAL- Patient is a 68 y.o. female who was seen today for physical therapy evaluation and treatment for bil low back pain with bil radiculopathy into bil Great toes with numbness in  R> L. Pt also with TTP over bil SI with R > L.  Katleen notes  increased LBP and constant bil  radicular pain  following injury after lifting 28 lb grandson May 2025.  Pt presents with signs and symptoms compatible with lumbar radiculopathy. Pt also has scoliosis. Pt would benefit from skilled PT for 1-2 times a week for 8 weeks to address above impariments and functional limitations and return to pain-free PLOF.   OBJECTIVE IMPAIRMENTS: decreased activity tolerance, decreased balance, decreased mobility, difficulty walking, decreased ROM, decreased strength, impaired sensation, improper body mechanics, postural dysfunction, obesity, and pain.   ACTIVITY LIMITATIONS: carrying, lifting, bending, standing, squatting, stairs, transfers, locomotion level, and caring for others  PARTICIPATION LIMITATIONS: meal prep, cleaning, laundry, driving, community activity, church, and caring for grandchild  PERSONAL FACTORS: DOS: 10/07/2022 Rt  triple arthrodesis (subtalar joint, the talonavicular joint, and the calcaneocuboid joint) gastrocnemius recession peroneal tendon lengthening with tibial bone marrow aspirate. Lumbar surgery in 2010 L5,  cervical surgery 2002 C 6/7 fusion, Right shoulder arthroscopy RTC 2019, DM HTN, asthma, OA, Sjogren's syndrome, RA,  are also affecting patient's functional outcome.   REHAB POTENTIAL: Good  CLINICAL DECISION MAKING: Evolving/moderate complexity  EVALUATION COMPLEXITY: Moderate   GOALS: Goals  reviewed with patient? Yes  SHORT TERM GOALS: Target date: 11-29-23  Independent with initial HEP Baseline:no knowledge Goal status: INITIAL  2.  Sit and stand with RT=LT wt bearing to reduce lumbar strain and allow for increased tolerance for these positions for home tasks Baseline: wt shift to left 8/22:  continued shift d/t  Goal status: Ongoing  3.  Demonstrate and verbalize techniques to reduce the risk of re-injury including: lifting, posture, body mechanics.  Baseline: no knowledge 8/22: MET Goal status: MET  4.  Pt will perform 5xSTS in <19.0 sec sec in order to demonstrate reduced fall risk and improved functional independence. (MCID of 2.3sec) Baseline: 21.92 8/22: not tested d/t increased pain d/t RA flare Goal status: ongoing    LONG TERM GOALS: Target date: 12-27-23  Pt will be independent with advanced HEP.  Baseline: no knowlege Goal status: INITIAL  2.  Rex will be able to carry grandson with good body mechanics without exacerbating pain. Amedeo is 30lb) Baseline: Pt injured herself picking up grandson in May 2025 Goal status: INITIAL  3.  Walk for 30 minutes with minimal pain in order to develop healthy habit Baseline: No routine exercise due to pain Goal status: INITIAL  4.  Pt. will show a >/= 12 pt improvement in their ODI score (MCID is 12 pts) as a proxy for functional improvement Baseline: 19/50  38 % Goal status: INITIAL  5.  Pt.  will improve two minute walk test to 337feet with =< SPC (MCID 40 ft). Baseline: 309.3 ft  Goal status: INITIAL  6.  Pt. Will be able to stand for 25 minutes with  pain no greater than 3/10 in order to complete household chores.   Baseline: Pt is not doing any household chores presently since injury Goal status: INITIAL  PLAN:  PT FREQUENCY: 1-2x/week  PT DURATION: 8 weeks  PLANNED INTERVENTIONS: 97164- PT Re-evaluation, 97750- Physical Performance Testing, 97110-Therapeutic exercises, 97530- Therapeutic  activity, [id number]- Neuromuscular re-education, 97535- Self Care, [id number]- Manual therapy, [id number]- Gait training, [phone]- Aquatic Therapy, [phone]- Electrical stimulation (manual), [phone] (1-2 muscles), [id number] (3+ muscles)- Dry Needling, Patient/Family education, Balance training, Stair training, Taping, Joint mobilization, Spinal mobilization, Cryotherapy, and Moist heat.  PLAN FOR NEXT SESSION: Progress HEP, Pharmacist, community training for caring for grandchildren. Check LLD and SI levels   Helene FORBES Gasmen PT 12/21/23 3:53 PM Phone: [phone] Fax: [phone]   Date of referral: 10-04-23 Referring provider: Vicci Barnie NOVAK, MD  Referring diagnosis? M54.16 (ICD-10-CM) - Lumbar radiculopathy  Treatment diagnosis? (if different than referring diagnosis) muscle weakness, and difficulty walking,   What was this (referring dx) caused by? Arthritis and Other: lifting injury(of grandchild  Lysle of Condition: Initial Onset (within last 3 months)   Laterality: Both  Current Functional Measure Score: Other ODI  19/50  38%  Objective measurements identify impairments when they are compared to normal values, the uninvolved extremity, and prior level of function.  [x]  Yes  []  No  Objective assessment of functional ability: Moderate functional limitations   Briefly describe symptoms: bil back pain with radiating pain to Great toes bil R > L  How did symptoms start: lifting 28 lb grandchild 11 weeks ago  Average pain intensity:  Last 24 hours: 7/10  Past week: 7/10 to 10/10  How often does the pt experience symptoms? Constantly  How much have the symptoms interfered with usual daily activities? A little bit  How has condition changed since care began at this facility? NA - initial visit  In general, how is the patients overall health? Good   BACK PAIN (STarT Back Screening Tool) Has pain spread down the leg(s) at some time in the last 2 weeks? Yes bil LE's Has there been pain in  the shoulder or neck at some time in the last  2 weeks? Yes with rain and OA past surgery in neck Has the pt only walked short distances because of back pain? Yes Has patient dressed more slowly because of back pain in the past 2 weeks? Yes Does patient think it's not safe for a person with this condition to be physically active? yes Does patient have worrying thoughts a lot of the time? yes Does patient feel back pain is terrible and will never get any better? no Has patient stopped enjoying things they usually enjoy? Yes because I can't dance and run or walk in the park

## 2023-12-28 ENCOUNTER — Ambulatory Visit: Admitting: Physical Therapy

## 2023-12-28 ENCOUNTER — Encounter: Payer: Self-pay | Admitting: Physical Therapy

## 2023-12-28 DIAGNOSIS — G8929 Other chronic pain: Secondary | ICD-10-CM

## 2023-12-28 DIAGNOSIS — M6281 Muscle weakness (generalized): Secondary | ICD-10-CM

## 2023-12-28 DIAGNOSIS — M5442 Lumbago with sciatica, left side: Secondary | ICD-10-CM | POA: Diagnosis not present

## 2023-12-28 DIAGNOSIS — R262 Difficulty in walking, not elsewhere classified: Secondary | ICD-10-CM | POA: Diagnosis not present

## 2023-12-28 DIAGNOSIS — M5441 Lumbago with sciatica, right side: Secondary | ICD-10-CM | POA: Diagnosis not present

## 2023-12-28 NOTE — Therapy (Addendum)
 PHYSICAL THERAPY UNPLANNED DISCHARGE SUMMARY   Visits from Start of Care: 8  Current functional level related to goals / functional outcomes: Current status unknown   Remaining deficits: Current status unknown   Education / Equipment: Pt has not returned since visit listed below  Patient goals were not assessed. Patient is being discharged due to not returning since the last visit.  (the note below was addended to include the above D/C summary on 03/28/2024)   Progress Note Reporting Period 7/22 to 9/18  See note below for Objective Data and Assessment of Progress/Goals.      Patient Name: [name] MRN: [id number] DOB:[birth year], 68 y.o., female Today's Date: 12/29/2023  END OF SESSION:  PT End of Session - 12/28/23 1458     Visit Number 8    Number of Visits 16    Date for PT Re-Evaluation 02/23/24    Authorization Type UHC MCR    PT Start Time 1500    PT Stop Time 1542    PT Time Calculation (min) 42 min    Activity Tolerance Patient tolerated treatment well;Patient limited by pain    Behavior During Therapy [facility] for tasks assessed/performed               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   HTN (hypertension)    Joint pain    Lactose intolerance    Multiple food allergies    OA (osteoarthritis)    Reflux    Sjogren's syndrome (HCC)    SOB (shortness of breath)    Swallowing difficulty    Thyroid  condition    Vitamin D  deficiency    Past Surgical History:  Procedure Laterality Date   APPENDECTOMY     BACK SURGERY  03/12/2010   L5   BREAST SURGERY     BIOPSY   HERNIA REPAIR     groin bilateral   LEFT HEART CATH AND CORONARY ANGIOGRAPHY  2019   [facility]: [place]: Normal coronaries   LEFT HEART CATH AND CORONARY ANGIOGRAPHY  2020    [place], [place].  Normal   NECK SURGERY     PELVIC LAPAROSCOPY  8009,8006   PILONIDAL CYST EXCISION     SALPINGOOPHORECTOMY  10/11/2002   SCOPE LSO-LEFT SEROUS CYSTADENOMA   SALPINGOOPHORECTOMY  04/12/2005   RSO/TOA   SHOULDER SURGERY Right 01/2018   TONSILLECTOMY AND ADENOIDECTOMY     TUBAL LIGATION     VAGINAL HYSTERECTOMY  01/10/2005   [facility], POSTERIOR REPAIR   Patient Active Problem List   Diagnosis Date Noted   Discomfort in chest 11/21/2023   Peripheral edema 11/21/2023   Dry mouth 06/24/2022   Joint swelling 06/24/2022   Other specified abnormal immunological findings in serum 06/24/2022   Pain in limb 06/24/2022   Insulin  resistance 05/19/2022   Low mean corpuscular hemoglobin concentration (MCHC) 05/19/2022   Other fatigue 05/05/2022   SOBOE (shortness of breath on exertion) 05/05/2022   Right foot pain 05/05/2022   Depression screen 05/05/2022   Arthritis 03/23/2022   Hyperlipidemia associated with type 2 diabetes mellitus (HCC) 12/24/2021   Essential hypertension 10/21/2021   Type 2 diabetes mellitus with hypoglycemia without coma, without long-term current use of  insulin  (HCC) 10/21/2021   Primary osteoarthritis involving multiple joints 10/21/2021   Mild intermittent asthma without complication 10/21/2021   History of herpes genitalis 10/21/2021   History of gout 10/21/2021   Gastroesophageal reflux disease without esophagitis 10/21/2021   Persistent cough 04/20/2021   Chronic vertigo 12/04/2020   COVID-19 04/16/2020   Neuropathic pain 04/12/2020   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   Gout 12/19/2019   Carotid artery stenosis 11/24/2019   Multiple joint pain 11/24/2019   Allergy to food 05/05/2018   Non-toxic nodular goiter 08/31/2017   Reflux    Asthma    Sjogren syndrome with inflammatory arthritis (HCC)     PCP: Vicci Barnie NOVAK, MD   REFERRING PROVIDER: Vicci Barnie NOVAK, MD   REFERRING DIAG: M54.16 (ICD-10-CM) -  Lumbar radiculopathy   Rationale for Evaluation and Treatment: Rehabilitation  THERAPY DIAG:  Chronic bilateral low back pain with bilateral sciatica - Plan: PT plan of care cert/re-cert  Muscle weakness (generalized) - Plan: PT plan of care cert/re-cert  Difficulty in walking, not elsewhere classified - Plan: PT plan of care cert/re-cert  ONSET DATE: Aug 20, 2023  SUBJECTIVE:                                                                                                                                                                                           SUBJECTIVE STATEMENT: Pt reports some continued improvement in local LBP following ESI but continues to endorse significant LE sxs when standing and walking.  EVAL I lifted my grandson when I picked him up (28 lb) I felt like I hurt my back and I am going on week 11 of my pain since that incident. I went to pelvic floor therapist , cheryl Elnor. And it was helpful. But now I know the exercises.  Pt has had several previous surgeries and admits she has not had a routine exercise for a while although she used to enjoy walking at [facility].  She is having difficulty performing household chores like washing dishes and vacuuming.  She would like to be able to exercise and reduce pain for more comfortable living and return to more active lifestyle  PERTINENT HISTORY:  DOS: 10/07/2022 Rt  triple arthrodesis (subtalar joint, the talonavicular joint, and the calcaneocuboid joint) gastrocnemius recession peroneal tendon lengthening with tibial bone marrow aspirate. Lumbar surgery in 2010 L5,  cervical surgery 2002 C 6/7 fusion, Right shoulder arthroscopy RTC 2019, DM HTN, asthma, OA, Sjogren's syndrome, RA,   PAIN:  Are you having pain? Yes: NPRS scale: at rest 5/10 and at worst 10/10 Pain location: low back bil radiating to  both feet to big toes bil Pain description: numbness and tingling in Right toe. Pain in left toe Aggravating factors:  Transition movements , getting in and out of bed, stepping into SUV, getting up to walking and holding on, standing for longer than 5 minutes. Can't wash dishes and mopping, vacuuming, stairs in dtr house, walking no more than 10 minutes Relieving factors: takes tylenol, heat  PRECAUTIONS: None  RED FLAGS: None   WEIGHT BEARING RESTRICTIONS: No  FALLS:  Has patient fallen in last 6 months? No  LIVING ENVIRONMENT: Lives with: lives with their family and lives alone Lives in: House/apartment Stairs: No Has following equipment at home: Single point cane  OCCUPATION: retired child psychotherapist  PLOF: Independent  PATIENT GOALS: Be able to pick up my grandson and to get back to walking for exercise  NEXT MD VISIT: TBD  OBJECTIVE:  Note: Objective measures were completed at Evaluation unless otherwise noted.  DIAGNOSTIC FINDINGS:  Pt completed but not read yet. Please see medical records  PATIENT SURVEYS:  Modified Oswestry:  MODIFIED OSWESTRY DISABILITY SCALE  Date: 11-01-23 Score  Pain intensity 1 = The pain is bad, but I can manage without having to take (1) I can stand as long as I want but, it increases my pain. pain medication.  2. Personal care (washing, dressing, etc.) 0 =  I can take care of myself normally without causing increased pain.  3. Lifting 4 = I can lift only very light weights  4. Walking 1 = Pain prevents me from walking more than 1 mile.  5. Sitting 2 =  Pain prevents me from sitting more than 1 hour.  6. Standing 3 =  Pain prevents me from standing more than 1/2 hour.  7. Sleeping 3 =  Even when I take pain medication, I sleep less than 4 hours.  8. Social Life 2 = Pain prevents me from participating in more energetic activities (eg. sports, dancing).  9. Traveling 1 =  I can travel anywhere, but it increases my pain.  10. Employment/ Homemaking 2 = I can perform most of my homemaking/job duties, but pain prevents me from performing more physically stressful  activities (eg, lifting, vacuuming).  Total 19/50  38%   Interpretation of scores: Score Category Description  0-20% Minimal Disability The patient can cope with most living activities. Usually no treatment is indicated apart from advice on lifting, sitting and exercise  21-40% Moderate Disability The patient experiences more pain and difficulty with sitting, lifting and standing. Travel and social life are more difficult and they may be disabled from work. Personal care, sexual activity and sleeping are not grossly affected, and the patient can usually be managed by conservative means  41-60% Severe Disability Pain remains the main problem in this group, but activities of daily living are affected. These patients require a detailed investigation  61-80% Crippled Back pain impinges on all aspects of the patients life. Positive intervention is required  81-100% Bed-bound  These patients are either bed-bound or exaggerating their symptoms  Bluford FORBES Zoe DELENA Karon DELENA, et al. Surgery versus conservative management of stable thoracolumbar fracture: the PRESTO feasibility RCT. [place] ([place]): [employer]; 2021 Nov. [facility] Technology Assessment, No. 25.62.) Appendix 3, Oswestry Disability Index category descriptors. Available from: Findjewelers.cz  Minimally Clinically Important Difference (MCID) = 12.8%  COGNITION: Overall cognitive status: Within functional limits for tasks assessed     SENSATION: Numbness in bil LE with pain down to bil Great toes  R > L  MUSCLE LENGTH: Hamstrings: bil tighteness about 60 degrees Thomas test:  Positive Right 20 degrees from horizontal and Left 20 degrees from horizontal   POSTURE: rounded shoulders, forward head, weight shift left, and scoliosis and obesity  PALPATION: TTP over bil SI joints r > L, also TTP over L 5, s1  LUMBAR ROM:   AROM eval  Flexion Fingertips to ankle bil *  Extension 50 % *  Right  lateral flexion 25 %  Left lateral flexion 25%  Right rotation 50%  Left rotation 50%   Key: WFL = within functional limits not formally assessed, * = concordant pain, s = stiffness/stretching sensation, NT = not tested)   (Blank rows = not tested, score listed is out of 5 possible points.  N = WNL, D = diminished, C = clear for gross weakness with myotome testing, * = concordant pain with testing)   LOWER EXTREMITY ROM:     Active  Right eval Left eval 11-09-23 R/L  Hip flexion     Hip extension     Hip abduction     Hip adduction     Hip internal rotation 23 30   Hip external rotation 45 49   Knee flexion 120 120  Left  prone 100 Right prone 112  Knee extension     Ankle dorsiflexion 6 9   Ankle plantarflexion     Ankle inversion     Ankle eversion      Key: WFL = within functional limits not formally assessed, * = concordant pain, s = stiffness/stretching sensation, NT = not tested)    LOWER EXTREMITY MMT:    MMT Right eval Left eval  Hip flexion 4- 4  Hip extension 4- 4  Hip abduction 3+ 4-  Hip adduction    Hip internal rotation    Hip external rotation    Knee flexion 4 4+  Knee extension 4 4+  Ankle dorsiflexion 4- 4  Ankle plantarflexion    Ankle inversion    Ankle eversion    Blank rows = not tested, score listed is out of 5 possible points.  N = WNL, D = diminished, C = clear for gross weakness with myotome testing, * = concordant pain with testing)   LUMBAR SPECIAL TESTS:   SLR test R +  SLump test negative  bil SI distraction and compression + bil   FABER - negative FADIR - Negative  FUNCTIONAL TESTS:  5 times sit to stand: 21.92 sec 2 minute walk test: 309.3 ft ( norm 509 ft)  GAIT: Distance walked: 309.3 ft ( norm 509 ft) Assistive device utilized: None Level of assistance: Complete Independence Comments: Pt with wide based gait and antalgic more on R than left  Trendelenberg Rgith  TREATMENT DATE:  TREATMENT 12/28/23:  Aquatic  therapy at [facility]- [address] - therapeutic pool temp 92 degrees Pt enters building independently.  Treatment took place in water 3.8 to  4 ft 8 in.feet deep depending upon activity.  Pt entered and exited the pool via stair and handrails    Aquatic Therapy:  Water walking for warm up fwd/lat/bkwds  Side stepping with shoulder adduction Marching with bil dumbells Black noodle push down Mini squats EOP Green noodle stomp HS curls EOP Hip abd EOP Heel raises EOP  Pt requires the buoyancy of water for active assisted exercises with buoyancy supported for strengthening and AROM exercises. Hydrostatic pressure also supports joints by unweighting joint load by at  least 50 % in 3-4 feet depth water. 80% in chest to neck deep water. Water will provide assistance with movement using the current and laminar flow while the buoyancy reduces weight bearing. Pt requires the viscosity of the water for resistance with strengthening exercises.  [facility] Adult PT Treatment:                                                DATE: 12-02-23  Therapeutic Exercise: Supine Pelvic Tilt  10 x 5 sec hold Pelvic tilt with : Alternating green clam Hip adduction LTR SAQ - 4# - 3x10 HS curl - GTB - 2x10 ea   [facility] Adult PT Treatment:                                                DATE: 11-16-23 Therapeutic Exercise: Hip flexion with 15 # KB  2 x 5 R and L Supine Pelvic Tilt  10 x 5 sec hold SKTC 5 x 5-10 sec hold LTR  3 reps each side- 20 hold Supine Bridge with ball  2 x 10 Manual Therapy: STW of Vastus Lateralis and rectus IASTYM of Vastus lateralis and rectus on Left LE Contract relax of Left quad muscle  MET of Right SI with decrease of pain Modalities Moist hot pack Therapeutic Activity: NuStep UE/LE and 6 min level 5 while taking subjective STS with15 # KB   10 x only  [facility] Adult PT Treatment:                                                DATE: 11-09-23 Therapeutic Exercise: Supine Pelvic  Tilt  10 x 5 sec hold SKTC 5 x 5-10 sec hold LTR  3 reps each side- 20 hold Supine Bridge  2 x 10 Supine bridge on pball 4 x but with neuro signs of ankle thigh and calf with cramping so DC Hip flexor stretch at edge of bed with SLR resisted  Seated Hamstring Stretch   VC and TC Quad stretch prone bil Manual Therapy: STW of Vastus Lateralis and rectus IASTYM of Vastus lateralis and rectus on Left LE Contract relax of Left quad muscle    Therapeutic Activity: NuStep UE/LE and 6 min level 4 while taking subjective STS with15 # KB   4 x only Self Care: Posture sitting and standing Body mechanics and household posture     EVAL and issue HEP and explanation of condition management  PATIENT EDUCATION:  Education details: POC Explanation of findings, issue HEP, condition managment Person educated: Patient Education method: Explanation, Demonstration, Tactile cues, Verbal cues, and Handouts Education comprehension: verbalized understanding, returned demonstration, verbal cues required, tactile cues required, and needs further education  HOME EXERCISE PROGRAM: Access Code: [email or web address] URL: [email or web address] Date: 11/01/2023 Prepared by: Graydon Dingwall  Exercises - Supine Pelvic Tilt  - 1 x daily - 7 x weekly - 3 sets - 10 reps - Supine Single Knee to Chest Stretch  - 2 x daily - 7 x weekly - 1 sets - 5 reps - 10 hold - Supine Lower Trunk Rotation  - 2 x daily - 7 x weekly - 1 sets - 5 reps - 20 hold - Supine Bridge  - 1 x daily - 7 x weekly - 2 sets - 10 reps - Seated Hamstring Stretch  - 1 x daily - 7 x weekly - 1 sets - 2 reps - 60 sec hold - Hip flexor stretch at edge of bed with SLR resisted  - 2 x daily - 7 x weekly - 1 sets - 1-2 reps - 60 hold Added 11-08-33 - Supine Figure 4 Piriformis Stretch  - 1 x daily - 7 x weekly - 1 sets -  2-3 reps - 30 sec hold - Sit to stand with sink support Movement snack  - 1 x daily - 7 x weekly - 3 sets - 10 reps - Prone Quadriceps Stretch with Strap  - 1 x daily - 7 x weekly - 1 sets - 2 reps - 60 sec hold  ASSESSMENT:  CLINICAL IMPRESSION:  Upon goal recheck pt has made minimal progress with land based PT.  She has had severe LBP with bil radicular sxs consistent with stenosis which limits her ability to stand and walk.  We trialed several visits of land based therapy before switching to aquatic therapy.  AT has been tolerated relatively well and allows Jasmina to participate in rehab.  She is working with MD team to control pain with steroid injections which have been moderately helpful so far and she will undergo another injection next week.  Given her limited visits in aquatic therapy so far and initial positive response I will extend POC to see if a combination of AT and injections will allow better tolerance to exercise to work toward functional improvement in walking and standing tasks.   EVAL- Patient is a 68 y.o. female who was seen today for physical therapy evaluation and treatment for bil low back pain with bil radiculopathy into bil Great toes with numbness in  R> L. Pt also with TTP over bil SI with R > L.  Maeve notes  increased LBP and constant bil  radicular pain  following injury after lifting 28 lb grandson May 2025.  Pt presents with signs and symptoms compatible with lumbar radiculopathy. Pt also has scoliosis. Pt would benefit from skilled PT for 1-2 times a week for 8 weeks to address above impariments and functional limitations and return to pain-free PLOF.   OBJECTIVE IMPAIRMENTS: decreased activity tolerance, decreased balance, decreased mobility, difficulty walking, decreased ROM, decreased strength, impaired sensation, improper body mechanics, postural dysfunction, obesity, and pain.   ACTIVITY LIMITATIONS: carrying, lifting, bending, standing, squatting, stairs,  transfers, locomotion level, and caring for others  PARTICIPATION LIMITATIONS: meal prep, cleaning, laundry, driving, community activity, church, and caring for grandchild  PERSONAL FACTORS: DOS: 10/07/2022 Rt  triple arthrodesis (subtalar joint, the talonavicular joint, and the calcaneocuboid joint)  gastrocnemius recession peroneal tendon lengthening with tibial bone marrow aspirate. Lumbar surgery in 2010 L5,  cervical surgery 2002 C 6/7 fusion, Right shoulder arthroscopy RTC 2019, DM HTN, asthma, OA, Sjogren's syndrome, RA,  are also affecting patient's functional outcome.   REHAB POTENTIAL: Good  CLINICAL DECISION MAKING: Evolving/moderate complexity  EVALUATION COMPLEXITY: Moderate   GOALS: Goals reviewed with patient? Yes  SHORT TERM GOALS: Target date: 11-29-23  Independent with initial HEP Baseline:no knowledge Goal status: INITIAL  2.  Sit and stand with RT=LT wt bearing to reduce lumbar strain and allow for increased tolerance for these positions for home tasks Baseline: wt shift to left 8/22: continued shift d/t  Goal status: Ongoing  3.  Demonstrate and verbalize techniques to reduce the risk of re-injury including: lifting, posture, body mechanics.  Baseline: no knowledge 8/22: MET Goal status: MET  4.  Pt will perform 5xSTS in <19.0 sec sec in order to demonstrate reduced fall risk and improved functional independence. (MCID of 2.3sec) Baseline: 21.92 8/22: not tested d/t increased pain d/t RA flare Goal status: ongoing    LONG TERM GOALS: Target date: 12-27-23 extended to 02/23/2024   Pt will be independent with advanced HEP.  Baseline: no knowlege Goal status: INITIAL  2.  Leyna will be able to carry grandson with good body mechanics without exacerbating pain. Amedeo is 30lb) Baseline: Pt injured herself picking up grandson in May 2025 Goal status: INITIAL  3.  Walk for 30 minutes with minimal pain in order to develop healthy habit Baseline: No  routine exercise due to pain 9/17: 2-3 min Goal status: Ongoing  4.  Pt. will show a >/= 12 pt improvement in their ODI score (MCID is 12 pts) as a proxy for functional improvement Baseline: 19/50  38 % 9/17: Modified Oswestry Low Back Pain Disability Questionnaire: 32 / 50 = 64.0 % Goal status: Ongoing  5.  Pt.  will improve two minute walk test to 342feet with =< SPC (MCID 40 ft). Baseline: 309.3 ft  Goal status: INITIAL  6.  Pt. Will be able to stand for 25 minutes with  pain no greater than 3/10 in order to complete household chores.   Baseline: Pt is not doing any household chores presently since injury 9/17: 3-5 min Goal status: Ongoing  PLAN:  PT FREQUENCY: 1-2x/week  PT DURATION: 8 weeks  PLANNED INTERVENTIONS: 97164- PT Re-evaluation, 97750- Physical Performance Testing, 97110-Therapeutic exercises, 97530- Therapeutic activity, 97112- Neuromuscular re-education, 97535- Self Care, [id number]- Manual therapy, [id number]- Gait training, [id number]- Aquatic Therapy, [phone]- Electrical stimulation (manual), [phone] (1-2 muscles), 20561 (3+ muscles)- Dry Needling, Patient/Family education, Balance training, Stair training, Taping, Joint mobilization, Spinal mobilization, Cryotherapy, and Moist heat.  PLAN FOR NEXT SESSION: Progress HEP, pharmacist, community training for caring for grandchildren. Check LLD and SI levels   Helene FORBES Gasmen PT 12/29/23 7:46 AM Phone: [phone] Fax: [phone]   Date of referral: 10-04-23 Referring provider: Vicci Barnie NOVAK, MD  Referring diagnosis? M54.16 (ICD-10-CM) - Lumbar radiculopathy  Treatment diagnosis? (if different than referring diagnosis) muscle weakness, and difficulty walking,   What was this (referring dx) caused by? Arthritis and Other: lifting injury(of grandchild  Lysle of Condition: Initial Onset (within last 3 months)   Laterality: Both  Current Functional Measure Score: Other ODI  64%  Objective measurements identify  impairments when they are compared to normal values, the uninvolved extremity, and prior level of function.  [x]  Yes  []  No  Objective assessment of functional ability: Moderate functional  limitations   Briefly describe symptoms: bil back pain with radiating pain to Great toes bil R > L  How did symptoms start: lifting 28 lb grandchild 11 weeks ago  Average pain intensity:  Last 24 hours: 7/10  Past week: 7/10 to 10/10  How often does the pt experience symptoms? Constantly  How much have the symptoms interfered with usual daily activities? Quite a bit  How has condition changed since care began at this facility? A little better  In general, how is the patients overall health? Good   BACK PAIN (STarT Back Screening Tool) Has pain spread down the leg(s) at some time in the last 2 weeks? Yes bil LE's Has there been pain in the shoulder or neck at some time in the last 2 weeks? Yes with rain and OA past surgery in neck Has the pt only walked short distances because of back pain? Yes Has patient dressed more slowly because of back pain in the past 2 weeks? Yes Does patient think it's not safe for a person with this condition to be physically active? yes Does patient have worrying thoughts a lot of the time? yes Does patient feel back pain is terrible and will never get any better? no Has patient stopped enjoying things they usually enjoy? Yes because I can't dance and run or walk in the park

## 2024-01-02 DIAGNOSIS — M461 Sacroiliitis, not elsewhere classified: Secondary | ICD-10-CM | POA: Diagnosis not present

## 2024-01-16 ENCOUNTER — Telehealth: Payer: Self-pay

## 2024-01-16 NOTE — Telephone Encounter (Signed)
 Pharmacy Patient Advocate Encounter   Received notification from CoverMyMeds that prior authorization for FREESTYLE LIBRE 3 PLUS SENSOR is required/requested.   Insurance verification completed.   The patient is insured through [facility] MEDICARE PART D.   Per test claim: PA required; PA submitted to above mentioned insurance via CoverMyMeds Key/confirmation #/EOC [id number] Status is pending

## 2024-01-17 NOTE — Telephone Encounter (Signed)
 Pharmacy Patient Advocate Encounter  Received notification from [facility] MEDICARE PART D that Prior Authorization for FREESTYLE LIBRE 3 PLUS SENSOR has been DENIED.  Full denial letter will be uploaded to the media tab. See denial reason below.   PA #/Case ID/Reference #: [id number]  Continuous glucose monitor system (receiver, transmitter, and sensor) is denied for not meeting the prior authorization requirement(s). Product authorization requires the following: (1) Submission of medical records (for example: chart notes, laboratory values) or claims history documenting one of the following: (A) You are being treated with insulin . (B) You have a history of problematic hypoglycemia with documentation of recurrent level 2 hypoglycemic events [glucose less than 54mg /dl (3.0 mmol/L)] that persist despite multiple attempts to adjust medication(s) and/or modify the diabetes treatment plan. (C) You have a history of problematic hypoglycemia with documentation of a history of a level 3 hypoglycemic event [glucose less than 54mg /dl (3.0 mmol/L)] characterized by altered mental and/or physical state requiring third-party assistance for treatment of hypoglycemia.

## 2024-01-18 ENCOUNTER — Encounter: Payer: Self-pay | Admitting: Internal Medicine

## 2024-01-30 ENCOUNTER — Other Ambulatory Visit: Payer: Self-pay

## 2024-01-30 DIAGNOSIS — H2513 Age-related nuclear cataract, bilateral: Secondary | ICD-10-CM | POA: Diagnosis not present

## 2024-01-30 DIAGNOSIS — E119 Type 2 diabetes mellitus without complications: Secondary | ICD-10-CM | POA: Diagnosis not present

## 2024-01-30 DIAGNOSIS — H52203 Unspecified astigmatism, bilateral: Secondary | ICD-10-CM | POA: Diagnosis not present

## 2024-02-03 ENCOUNTER — Other Ambulatory Visit (HOSPITAL_COMMUNITY)
Admission: RE | Admit: 2024-02-03 | Discharge: 2024-02-03 | Disposition: A | Source: Ambulatory Visit | Attending: Internal Medicine | Admitting: Internal Medicine

## 2024-02-03 ENCOUNTER — Ambulatory Visit: Attending: Internal Medicine | Admitting: Internal Medicine

## 2024-02-03 ENCOUNTER — Encounter: Payer: Self-pay | Admitting: Internal Medicine

## 2024-02-03 VITALS — BP 119/79 | HR 80 | Ht 66.0 in | Wt 212.0 lb

## 2024-02-03 DIAGNOSIS — M069 Rheumatoid arthritis, unspecified: Secondary | ICD-10-CM | POA: Diagnosis not present

## 2024-02-03 DIAGNOSIS — N760 Acute vaginitis: Secondary | ICD-10-CM | POA: Diagnosis present

## 2024-02-03 DIAGNOSIS — E1159 Type 2 diabetes mellitus with other circulatory complications: Secondary | ICD-10-CM

## 2024-02-03 DIAGNOSIS — Z23 Encounter for immunization: Secondary | ICD-10-CM

## 2024-02-03 DIAGNOSIS — K219 Gastro-esophageal reflux disease without esophagitis: Secondary | ICD-10-CM

## 2024-02-03 DIAGNOSIS — E785 Hyperlipidemia, unspecified: Secondary | ICD-10-CM | POA: Diagnosis not present

## 2024-02-03 DIAGNOSIS — E669 Obesity, unspecified: Secondary | ICD-10-CM

## 2024-02-03 DIAGNOSIS — E1169 Type 2 diabetes mellitus with other specified complication: Secondary | ICD-10-CM | POA: Diagnosis not present

## 2024-02-03 DIAGNOSIS — I152 Hypertension secondary to endocrine disorders: Secondary | ICD-10-CM

## 2024-02-03 DIAGNOSIS — Z87898 Personal history of other specified conditions: Secondary | ICD-10-CM | POA: Diagnosis not present

## 2024-02-03 DIAGNOSIS — M5416 Radiculopathy, lumbar region: Secondary | ICD-10-CM | POA: Diagnosis not present

## 2024-02-03 DIAGNOSIS — R300 Dysuria: Secondary | ICD-10-CM

## 2024-02-03 DIAGNOSIS — R252 Cramp and spasm: Secondary | ICD-10-CM | POA: Diagnosis not present

## 2024-02-03 LAB — POCT URINALYSIS DIP (CLINITEK)
Bilirubin, UA: NEGATIVE
Blood, UA: NEGATIVE
Glucose, UA: NEGATIVE mg/dL
Ketones, POC UA: NEGATIVE mg/dL
Leukocytes, UA: NEGATIVE
Nitrite, UA: NEGATIVE
POC PROTEIN,UA: NEGATIVE
Spec Grav, UA: 1.02 (ref 1.010–1.025)
Urobilinogen, UA: 0.2 U/dL
pH, UA: 6.5 (ref 5.0–8.0)

## 2024-02-03 LAB — POCT GLYCOSYLATED HEMOGLOBIN (HGB A1C): HbA1c, POC (controlled diabetic range): 6.4 % (ref 0.0–7.0)

## 2024-02-03 LAB — GLUCOSE, POCT (MANUAL RESULT ENTRY)
POC Glucose: 78 mg/dL (ref 70–99)
POC Glucose: 92 mg/dL (ref 70–99)

## 2024-02-03 MED ORDER — FLUCONAZOLE 150 MG PO TABS: 150.0000 mg | ORAL_TABLET | Freq: Every day | ORAL | 0 refills | Status: AC

## 2024-02-03 MED ORDER — CYCLOBENZAPRINE HCL 10 MG PO TABS: 10.0000 mg | ORAL_TABLET | Freq: Three times a day (TID) | ORAL | 1 refills | Status: DC | PRN

## 2024-02-03 MED ORDER — DEXLANSOPRAZOLE 60 MG PO CPDR: 60.0000 mg | DELAYED_RELEASE_CAPSULE | Freq: Every day | ORAL | 6 refills | Status: AC

## 2024-02-03 MED ORDER — MECLIZINE HCL 25 MG PO TABS: 25.0000 mg | ORAL_TABLET | Freq: Two times a day (BID) | ORAL | 1 refills | Status: AC | PRN

## 2024-02-03 MED ORDER — ATORVASTATIN CALCIUM 10 MG PO TABS: ORAL_TABLET | ORAL | 4 refills | Status: AC

## 2024-02-03 MED ORDER — HYDROCHLOROTHIAZIDE 25 MG PO TABS: 25.0000 mg | ORAL_TABLET | Freq: Two times a day (BID) | ORAL | 2 refills | Status: AC

## 2024-02-03 NOTE — Patient Instructions (Addendum)
  VISIT SUMMARY: During your visit today, we discussed your recent symptoms and ongoing health conditions. You reported urinary frequency, suprapubic pain, and vaginal symptoms, which we evaluated for possible infections. We also reviewed your diabetes, hypertension, rheumatoid arthritis, Sjogren's syndrome, and submandibular gland issues. Your blood pressure and diabetes management are well-controlled, and we discussed continuing your current medications and monitoring routines. We also addressed your back pain and general health maintenance, including the need for a flu shot and upcoming specialist appointments.  YOUR PLAN: -VAGINAL CANDIDIASIS: Vaginal candidiasis is a yeast infection causing itching, discharge, and irritation. We prescribed Diflucan  (fluconazole ) with instructions to take one pill now and keep the other for potential recurrence. A vaginal swab was ordered to check for yeast and bacterial vaginosis.  -SUSPECTED URINARY TRACT INFECTION: A urinary tract infection (UTI) can cause urinary frequency, pain, and dark urine. We ordered a urine culture to confirm or rule out a UTI.  -TYPE 2 DIABETES MELLITUS: Type 2 diabetes is a condition where your body does not use insulin  properly, leading to high blood sugar levels. Your A1c is at 6.4%, indicating good control with your diet. Continue your current dietary management and regular blood glucose monitoring.  -HYPERTENSION: Hypertension is high blood pressure. Your blood pressure is well-controlled with your current medications. Continue taking nebivolol  5 mg daily and hydrochlorothiazide  25 mg twice daily, and keep monitoring your blood pressure regularly.  -RHEUMATOID ARTHRITIS: Rheumatoid arthritis is an autoimmune condition causing joint inflammation. Your symptoms are well-controlled with leflunomide. Continue taking leflunomide as prescribed..  -SUBMANDIBULAR SIALOLITHIASIS: Submandibular sialolithiasis is the formation of stones in  the salivary glands, causing pain. Your symptoms have improved with hydration and lemon sucking. Continue these practices as needed.  -LUMBAR RADICULOPATHY: Lumbar radiculopathy is nerve pain in the lower back. Your pain has returned but is manageable after physical therapy and epidural injections. Continue managing the pain as discussed.  -GENERAL HEALTH MAINTENANCE: You are due for a flu shot, which we will administer before you leave. Ensure you follow up with your rheumatologist and cardiologist as scheduled.  INSTRUCTIONS: Please take the prescribed Diflucan  for your yeast infection and keep the second pill for potential recurrence. Complete the urine culture as ordered to check for a UTI. Continue your current medications and monitoring routines for diabetes, hypertension, rheumatoid arthritis, and Sjogren's syndrome. Maintain hydration and lemon sucking for yo ur submandibular gland issues. Manage your back pain as discussed. Get your flu shot before leaving and follow up with your rheumatologist and cardiologist as scheduled.                      Contains text generated by Abridge.                                 Contains text generated by Abridge.

## 2024-02-03 NOTE — Progress Notes (Signed)
 Patient ID: [name], female    DOB: 1956-01-13  MRN: [id number]  CC: Diabetes (DM f/u./Lower back back pain, lower abdominal pain, urinary frequency, painful urination, vaginal area breakout X2 weeks/Flu vax administered on 02/03/24 - C.A.)   Subjective: [name] is a 68 y.o. female who presents for chronic ds management. Her concerns today include:  Patient with history of DM type II, HTN, HL, Sjogren syndrome, RA, Raynaud's phenomenon, OA knees/ankles/shoulders/hips, HSV 2, asthma, gout, thyroid  nodules followed by Dr. Eletha, genital herpes, GERD, vaginal wall prolapse, OAB.   Discussed the use of AI scribe software for clinical note transcription with the patient, who gave verbal consent to proceed.  History of Present Illness [name] is a 68 year old female who presents for a four-month follow-up and evaluation of possible urinary tract infection and vaginal symptoms.  She has been experiencing urinary frequency and suprapubic pain after urination for the past two weeks. No dysuria or hematuria is noted. She attributes the very yellow color of her urine to her morning vitamin B pill, with particularly dark urine in the morning. She has experienced chills but no fever.  She reports a vaginal breakout with discharge and irritation, suspecting a yeast infection.  She experiences vaginal itching and has noted a couple of lesions from rubbing skin too hard due to the itchiness. She denies any recent sexual activity and is not concerned about sexually transmitted infections.  DM:  Lab Results  Component Value Date   HGBA1C 6.4 02/03/2024  BS 78 BS post snack 92  Her diabetes management includes diet control, with her A1c remaining at 6.4% since her last visit four months ago. She monitors her blood sugar levels, which are typically below 145 mg/dL in the morning and between 130-145 mg/dL at bedtime. In the past when she used a CGM, BS would drop at nights but she has  not had any episodes lately. She no longer uses a continuous glucose monitor due to insurance issues and relies on finger pricks for monitoring.  HTN:  she takes nebivolol  5 mg daily and hydrochlorothiazide  25 mg twice daily. She checks her blood pressure weekly, which is usually normal, and reports it is well-controlled.  She has a history of lumbar radiculopathy and completed eight physical therapy sessions, although Medicare only covered four. She received epidural injections in mid-September for back and hip pain, which provided temporary relief. The pain has returned but is manageable compared to what it was when she saw me 4 mths ago.  On last visit with me, she complained of swollen right submandibular gland.  We thought the gland had stone and possible infection.  Did not improve with antibiotics.  Seen by [facility] ENT who also felt that she likely had stone in the gland.  Patient was advised to increase hydration and to suck on lemon drops.  Symptoms significantly improved.  She is treated for rheumatoid arthritis with leflunomide and takes meloxicam  for Sjogren's syndrome. She reports good control of her rheumatoid arthritis symptoms with leflunomide. Followed by [facility]umatology and has regular blood draws.  She is on atorvastatin  10 mg three times a week for cholesterol management and tolerates it well.  GERD: Doing much better since last visit.  She continues to take Dexilant  and needs refill.  Also requests refill of Flexeril  which she uses as needed for cramps and meclizine  which she keeps on hand to use as needed for vertigo.    Patient Active Problem List  Diagnosis Date Noted   Discomfort in chest 11/21/2023   Peripheral edema 11/21/2023   Dry mouth 06/24/2022   Joint swelling 06/24/2022   Other specified abnormal immunological findings in serum 06/24/2022   Pain in limb 06/24/2022   Insulin  resistance 05/19/2022   Low mean corpuscular hemoglobin concentration (MCHC)  05/19/2022   Other fatigue 05/05/2022   SOBOE (shortness of breath on exertion) 05/05/2022   Right foot pain 05/05/2022   Depression screen 05/05/2022   Arthritis 03/23/2022   Hyperlipidemia associated with type 2 diabetes mellitus (HCC) 12/24/2021   Essential hypertension 10/21/2021   Type 2 diabetes mellitus with hypoglycemia without coma, without long-term current use of insulin  (HCC) 10/21/2021   Primary osteoarthritis involving multiple joints 10/21/2021   Mild intermittent asthma without complication 10/21/2021   History of herpes genitalis 10/21/2021   History of gout 10/21/2021   Gastroesophageal reflux disease without esophagitis 10/21/2021   Persistent cough 04/20/2021   Chronic vertigo 12/04/2020   COVID-19 04/16/2020   Neuropathic pain 04/12/2020   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   Gout 12/19/2019   Carotid artery stenosis 11/24/2019   Multiple joint pain 11/24/2019   Allergy to food 05/05/2018   Non-toxic nodular goiter 08/31/2017   Reflux    Asthma    Sjogren syndrome with inflammatory arthritis      Current Outpatient Medications on File Prior to Visit  Medication Sig Dispense Refill   acetaminophen (TYLENOL) 650 MG CR tablet Take 650 mg by mouth as needed.       albuterol  (VENTOLIN  HFA) 108 (90 Base) MCG/ACT inhaler Inhale 2 puffs into the lungs every 6 (six) hours as needed for wheezing or shortness of breath. 8 g 1   ascorbic acid (VITAMIN C) 500 MG tablet Take 500 mg by mouth daily.     b complex vitamins capsule Take 1 capsule by mouth daily. 1 chewable once daily.     benzonatate  (TESSALON ) 200 MG capsule Take 1 capsule by mouth three times daily as needed for cough 30 capsule 0   BIOTIN PO Take by mouth.     Calcium  500-2.5 MG-MCG CHEW Chew 2 tablets by mouth daily.     Cetirizine HCl (ZYRTEC PO) Take 10 mg by mouth.      Cholecalciferol (VITAMIN D  PO) Take by mouth. TAKES 2000      Cinnamon 500 MG TABS Take 2 tablets by mouth daily.      Continuous Glucose Sensor (FREESTYLE LIBRE 3 PLUS SENSOR) MISC Change sensor every 15 days. 2 each 12   cycloSPORINE (RESTASIS) 0.05 % ophthalmic emulsion 1 drop 2 (two) times daily.     diclofenac Sodium (VOLTAREN) 1 % GEL Apply 2 g topically daily. Once daily in the morning as needed.     EPINEPHrine  0.3 mg/0.3 mL IJ SOAJ injection Inject 0.3 mg into the muscle as needed for anaphylaxis. 1 each 1   furosemide  (LASIX ) 20 MG tablet Take 1 tablet (20 mg total) by mouth daily as needed. 30 tablet 3   Glucose 15 g PACK Take by mouth. Chew up to 4 tablets prn.     glucose blood (FREESTYLE LITE) test strip Use as instructed 100 each 12   ipratropium-albuterol  (DUONEB) 0.5-2.5 (3) MG/3ML SOLN Take 3 mLs by nebulization every 6 (six) hours as needed. 100 mL 3   Lancets (FREESTYLE) lancets Use as instructed 100 each 12   leflunomide (ARAVA) 20 MG tablet Take 20 mg by mouth daily.     meloxicam  (MOBIC ) 15  MG tablet Take 1 tablet (15 mg total) by mouth daily. 30 tablet 5   montelukast  (SINGULAIR ) 10 MG tablet Take 1 tablet (10 mg total) by mouth at bedtime. 30 tablet 11   Multiple Vitamins-Iron (MULTIVITAMIN/IRON PO) Take by mouth.       nebivolol  (BYSTOLIC ) 5 MG tablet Take 1 tablet (5 mg total) by mouth every morning. 90 tablet 3   Povidone, PF, (IVIZIA DRY EYES) 0.5 % SOLN Apply 0.5 % to eye daily as needed.     traMADol  (ULTRAM ) 50 MG tablet Take 1 tablet (50 mg total) by mouth every 8 (eight) hours as needed. 15 tablet 0   triamcinolone (NASACORT) 55 MCG/ACT nasal inhaler Place 2 sprays into the nose as needed.       valACYclovir  (VALTREX ) 500 MG tablet Take 1 tablet (500 mg total) by mouth daily. 30 tablet 11   Vitamin D , Ergocalciferol , (DRISDOL ) 1.25 MG (50000 UNIT) CAPS capsule Take 1 capsule by mouth once a week 12 capsule 1   vitamin E 180 MG (400 UNITS) capsule Take 400 Units by mouth in the morning and at bedtime.     Continuous Glucose Receiver (FREESTYLE LIBRE 3 READER) DEVI UAD (Patient not  taking: Reported on 02/03/2024) 1 each 0   folic acid  (FOLVITE ) 1 MG tablet Take 1 mg by mouth daily. 2 tablets daily (Patient not taking: Reported on 02/03/2024)     polyethylene glycol (MIRALAX / GLYCOLAX) 17 g packet Take 17 g by mouth daily. (Patient not taking: Reported on 02/03/2024)     No current facility-administered medications on file prior to visit.    Allergies  Allergen Reactions   Codeine Swelling   Duratuss [Phenylephrine -Guaifenesin] Hives   Erythromycin Swelling and Hives    ALLERGIC TO MYCINS  ALLERGIC TO MYCINS ALLERGIC TO MYCINS    ALLERGIC TO MYCINS   Hydrocodone-Acetaminophen Swelling   Iodine Swelling   Latex Swelling   Oxycodone Swelling and Other (See Comments)   Peanuts [Nuts] Swelling   Penicillins Swelling and Other (See Comments)   Shellfish Allergy Swelling   Vicodin [Hydrocodone-Acetaminophen] Swelling   Grass Pollen(K-O-R-T-Swt Vern) Rash   Ace Inhibitors    Acetaminophen    Augmentin [Amoxicillin-Pot Clavulanate]    Clavulanic Acid    Hydrocodone    Phentermine     Other Reaction(s): Not available    Social History   Socioeconomic History   Marital status: Single    Spouse name: Not on file   Number of children: Not on file   Years of education: Not on file   Highest education level: Bachelor's degree (e.g., BA, AB, BS)  Occupational History   Not on file  Tobacco Use   Smoking status: Former   Smokeless tobacco: Never  Vaping Use   Vaping status: Never Used  Substance and Sexual Activity   Alcohol use: Yes    Comment: Rare   Drug use: No   Sexual activity: Not Currently    Birth control/protection: Post-menopausal  Other Topics Concern   Not on file  Social History Narrative   - Employment: 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   - Partner Status: Married   - Living Situation: Lives in her hometown   Social Drivers of Health   Financial  Resource Strain: Low Risk  (11/22/2023)   Overall Financial Resource Strain (CARDIA)    Difficulty of Paying Living Expenses: Not hard at all  Food  Insecurity: No Food Insecurity (11/22/2023)   Hunger Vital Sign    Worried About Running Out of Food in the Last Year: Never true    Ran Out of Food in the Last Year: Never true  Transportation Needs: No Transportation Needs (11/22/2023)   PRAPARE - Administrator, Civil Service (Medical): No    Lack of Transportation (Non-Medical): No  Physical Activity: Inactive (11/22/2023)   Exercise Vital Sign    Days of Exercise per Week: 0 days    Minutes of Exercise per Session: 0 min  Stress: No Stress Concern Present (11/22/2023)   [employer] of Occupational Health - Occupational Stress Questionnaire    Feeling of Stress: Only a little  Social Connections: Moderately Integrated (11/22/2023)   Social Connection and Isolation Panel    Frequency of Communication with Friends and Family: More than three times a week    Frequency of Social Gatherings with Friends and Family: More than three times a week    Attends Religious Services: More than 4 times per year    Active Member of Clubs or Organizations: Yes    Attends Banker Meetings: More than 4 times per year    Marital Status: Divorced  Intimate Partner Violence: Not At Risk (11/22/2023)   Humiliation, Afraid, Rape, and Kick questionnaire    Fear of Current or Ex-Partner: No    Emotionally Abused: No    Physically Abused: No    Sexually Abused: No    Family History  Problem Relation Age of Onset   Hypertension Mother    Heart disease Mother    Arthritis Mother    Hyperlipidemia Mother    Stroke Mother    Heart disease Father    Hypertension Father    Cancer Father        Prostate and lung   Alcohol abuse Father    Anxiety disorder Father    Cancer Paternal Grandmother        COLON CA   Breast cancer Maternal Aunt        LATE 50'S   Ovarian cancer Maternal  Aunt     Past Surgical History:  Procedure Laterality Date   APPENDECTOMY     BACK SURGERY  03/12/2010   L5   BREAST SURGERY     BIOPSY   HERNIA REPAIR     groin bilateral   LEFT HEART CATH AND CORONARY ANGIOGRAPHY  2019   [facility]: Normal coronaries   LEFT HEART CATH AND CORONARY ANGIOGRAPHY  2020   [place], [place].  Normal   NECK SURGERY     PELVIC LAPAROSCOPY  8009,8006   PILONIDAL CYST EXCISION     SALPINGOOPHORECTOMY  10/11/2002   SCOPE LSO-LEFT SEROUS CYSTADENOMA   SALPINGOOPHORECTOMY  04/12/2005   RSO/TOA   SHOULDER SURGERY Right 01/2018   TONSILLECTOMY AND ADENOIDECTOMY     TUBAL LIGATION     VAGINAL HYSTERECTOMY  01/10/2005   [facility], POSTERIOR REPAIR    ROS: Review of Systems Negative except as stated above  PHYSICAL EXAM: BP 119/79 (BP Location: Left Arm, Patient Position: Sitting, Cuff Size: Large)   Pulse 80   Ht 5' 6 (1.676 m)   Wt 212 lb (96.2 kg)   SpO2 (!) 9%   BMI 34.22 kg/m   Physical Exam  General appearance - alert, well appearing, older AAF and in no distress Mental status - normal mood, behavior, speech, dress, motor activity, and thought processes Neck -  supple, no significant adenopathy Chest - clear to auscultation, no wheezes, rales or rhonchi, symmetric air entry Heart - normal rate, regular rhythm, normal S1, S2, no murmurs, rubs, clicks or gallops Extremities - trace LE edema Diabetic Foot Exam - Simple   Simple Foot Form Diabetic Foot exam was performed with the following findings: Yes 02/03/2024  1:26 PM  Visual Inspection See comments: Yes Sensation Testing Intact to touch and monofilament testing bilaterally: Yes Pulse Check Posterior Tibialis and Dorsalis pulse intact bilaterally: Yes Comments Noninflamed bunion of the left big toe.  Shaved calluses noted on 2nd and 3rd toes bilaterally.  Flat-footed.  Healed scar noted on the dorsal surface of the right foot from previous surgery.      Results for orders placed or performed in visit on 02/03/24  POCT glucose (manual entry)   Collection Time: 02/03/24 11:37 AM  Result Value Ref Range   POC Glucose 78 70 - 99 mg/dl  POCT glycosylated hemoglobin (Hb A1C)   Collection Time: 02/03/24 11:42 AM  Result Value Ref Range   Hemoglobin A1C     HbA1c POC (<> result, manual entry)     HbA1c, POC (prediabetic range)     HbA1c, POC (controlled diabetic range) 6.4 0.0 - 7.0 %  POCT URINALYSIS DIP (CLINITEK)   Collection Time: 02/03/24 11:43 AM  Result Value Ref Range   Color, UA yellow yellow   Clarity, UA clear clear   Glucose, UA negative negative mg/dL   Bilirubin, UA negative negative   Ketones, POC UA negative negative mg/dL   Spec Grav, UA 8.979 8.989 - 1.025   Blood, UA negative negative   pH, UA 6.5 5.0 - 8.0   POC PROTEIN,UA negative negative, trace   Urobilinogen, UA 0.2 0.2 or 1.0 E.U./dL   Nitrite, UA Negative Negative   Leukocytes, UA Negative Negative  POCT glucose (manual entry)   Collection Time: 02/03/24 12:21 PM  Result Value Ref Range   POC Glucose 92 70 - 99 mg/dl        Latest Ref Rng & Units 10/04/2023    1:35 PM 03/29/2023   12:09 PM 05/05/2022   10:08 AM  CMP  Glucose 70 - 99 mg/dL 899  890  90   BUN 8 - 27 mg/dL 19  26  24    Creatinine 0.57 - 1.00 mg/dL 9.13  9.07  9.10   Sodium 134 - 144 mmol/L 142  137  142   Potassium 3.5 - 5.2 mmol/L 3.5  3.6  4.1   Chloride 96 - 106 mmol/L 100  100  101   CO2 20 - 29 mmol/L 25  29  27    Calcium  8.7 - 10.3 mg/dL 9.3  9.2  9.2   Total Protein 6.0 - 8.5 g/dL 7.2   6.8   Total Bilirubin 0.0 - 1.2 mg/dL 0.3   0.3   Alkaline Phos 44 - 121 IU/L 150   115   AST 0 - 40 IU/L 33   24   ALT 0 - 32 IU/L 45   29    Lipid Panel     Component Value Date/Time   CHOL 180 10/04/2023 1335   TRIG 145 10/04/2023 1335   HDL 49 10/04/2023 1335   CHOLHDL 3.7 10/04/2023 1335   LDLCALC 105 (H) 10/04/2023 1335    CBC    Component Value Date/Time   WBC 4.4  03/29/2023 1209   RBC 4.79 03/29/2023 1209   HGB 13.2 03/29/2023 1209  HGB 13.0 05/05/2022 1008   HCT 41.0 03/29/2023 1209   HCT 41.7 05/05/2022 1008   PLT 245 03/29/2023 1209   PLT 188 05/05/2022 1008   MCV 85.6 03/29/2023 1209   MCV 81 05/05/2022 1008   MCH 27.6 03/29/2023 1209   MCHC 32.2 03/29/2023 1209   RDW 14.8 03/29/2023 1209   RDW 13.2 05/05/2022 1008   LYMPHSABS 1.4 05/05/2022 1008   MONOABS 0.4 03/17/2009 1053   EOSABS 0.1 05/05/2022 1008   BASOSABS 0.0 05/05/2022 1008    ASSESSMENT AND PLAN: 1. Type 2 diabetes mellitus in patient with obesity (HCC) (Primary) A1c is at goal and diet control.  Home blood sugar readings as reported are good.  She denies any recent low blood sugar episodes.  Blood sugar was slightly low today in the office.  After small snack, level was 92. Continue healthy eating habits. - POCT glucose (manual entry) - POCT glycosylated hemoglobin (Hb A1C) - POCT glucose (manual entry)  2. Hypertension associated with diabetes (HCC) Controlled.  Continue hydrochlorothiazide  25 mg twice a day and Nebivolol  5 mg daily - hydrochlorothiazide  (HYDRODIURIL ) 25 MG tablet; Take 1 tablet (25 mg total) by mouth 2 (two) times daily.  Dispense: 180 tablet; Refill: 2  3. Hyperlipidemia associated with type 2 diabetes mellitus (HCC) Continue atorvastatin  3 times a week - atorvastatin  (LIPITOR) 10 MG tablet; 1 tab PO Q Mon/Wed/Frid  Dispense: 36 tablet; Refill: 4  4. Acute vaginitis Will treat empirically for vaginal yeast - fluconazole  (DIFLUCAN ) 150 MG tablet; Take 1 tablet (150 mg total) by mouth daily.  Dispense: 2 tablet; Refill: 0 - Cervicovaginal ancillary only  5. Dysuria Urine dip today is negative but will send urine culture - POCT URINALYSIS DIP (CLINITEK) - Urine Culture  6. Lumbar radiculopathy Clinically improved.   Followed by neurosurgery.  7. Rheumatoid arthritis, involving unspecified site, unspecified whether rheumatoid factor present  (HCC) Stable on Arava.  Followed by rheumatology.  8. Gastroesophageal reflux disease without esophagitis - dexlansoprazole  (DEXILANT ) 60 MG capsule; Take 1 capsule (60 mg total) by mouth daily.  Dispense: 30 capsule; Refill: 6  9. Muscle cramp - cyclobenzaprine  (FLEXERIL ) 10 MG tablet; Take 1 tablet (10 mg total) by mouth 3 (three) times daily as needed.  Dispense: 90 tablet; Refill: 1  10. History of vertigo - meclizine  (ANTIVERT ) 25 MG tablet; Take 1 tablet (25 mg total) by mouth 2 (two) times daily as needed for dizziness.  Dispense: 30 tablet; Refill: 1  11. Need for immunization against influenza - Flu vaccine HIGH DOSE PF(Fluzone Trivalent)   Patient was given the opportunity to ask questions.  Patient verbalized understanding of the plan and was able to repeat key elements of the plan.   This documentation was completed using Paediatric nurse.  Any transcriptional errors are unintentional.  Orders Placed This Encounter  Procedures   Urine Culture   Flu vaccine HIGH DOSE PF(Fluzone Trivalent)   POCT glucose (manual entry)   POCT glycosylated hemoglobin (Hb A1C)   POCT URINALYSIS DIP (CLINITEK)   POCT glucose (manual entry)     Requested Prescriptions   Signed Prescriptions Disp Refills   fluconazole  (DIFLUCAN ) 150 MG tablet 2 tablet 0    Sig: Take 1 tablet (150 mg total) by mouth daily.   cyclobenzaprine  (FLEXERIL ) 10 MG tablet 90 tablet 1    Sig: Take 1 tablet (10 mg total) by mouth 3 (three) times daily as needed.   hydrochlorothiazide  (HYDRODIURIL ) 25 MG tablet 180 tablet 2  Sig: Take 1 tablet (25 mg total) by mouth 2 (two) times daily.   dexlansoprazole  (DEXILANT ) 60 MG capsule 30 capsule 6    Sig: Take 1 capsule (60 mg total) by mouth daily.   atorvastatin  (LIPITOR) 10 MG tablet 36 tablet 4    Sig: 1 tab PO Q Mon/Wed/Frid   meclizine  (ANTIVERT ) 25 MG tablet 30 tablet 1    Sig: Take 1 tablet (25 mg total) by mouth 2 (two) times daily as  needed for dizziness.    Return in about 4 months (around 06/05/2024).  Barnie Louder, MD, FACP

## 2024-02-05 ENCOUNTER — Ambulatory Visit: Payer: Self-pay | Admitting: Internal Medicine

## 2024-02-05 LAB — URINE CULTURE

## 2024-02-06 LAB — CERVICOVAGINAL ANCILLARY ONLY
Bacterial Vaginitis (gardnerella): NEGATIVE
Candida Glabrata: NEGATIVE
Candida Vaginitis: NEGATIVE
Comment: NEGATIVE
Comment: NEGATIVE
Comment: NEGATIVE

## 2024-02-07 DIAGNOSIS — M4316 Spondylolisthesis, lumbar region: Secondary | ICD-10-CM | POA: Diagnosis not present

## 2024-02-07 DIAGNOSIS — M48062 Spinal stenosis, lumbar region with neurogenic claudication: Secondary | ICD-10-CM | POA: Diagnosis not present

## 2024-02-16 ENCOUNTER — Telehealth: Payer: Self-pay | Admitting: Internal Medicine

## 2024-02-16 NOTE — Telephone Encounter (Signed)
 Copied from CRM [id number]. Topic: General - Billing Inquiry >> Feb 16, 2024 12:40 PM Gustabo D wrote: Physical therapy billing problem pt says she needs to do a appeal Saying that her insurance is trying to make her pay for 4 sessions she has done 8 and says they are saying due to no improvement they want her to pay. Pt needs something sent to insurance saying she needs to do therapy. Letter from pcp asking for coverage on her behalf.

## 2024-02-20 ENCOUNTER — Other Ambulatory Visit: Payer: Self-pay | Admitting: Internal Medicine

## 2024-02-20 DIAGNOSIS — E11649 Type 2 diabetes mellitus with hypoglycemia without coma: Secondary | ICD-10-CM

## 2024-02-20 NOTE — Telephone Encounter (Signed)
 Copied from CRM [id number]. Topic: General - Billing Inquiry  >> Feb 16, 2024 12:40 PM Gustabo D wrote:  Physical therapy billing problem pt says she needs to do a appeal Saying that her insurance is trying to make her pay for 4 sessions she has done 8 and says they are saying due to no improvement they want her to pay. Pt needs something sent to insurance saying she needs to do therapy. Letter from pcp asking for coverage on her behalf.  >> Feb 20, 2024  2:02 PM Amy B wrote: Saying that her insurance is trying to make her pay for 4 sessions she has done 8 and says they are saying due to no improvement they want her to pay. Pt needs something sent to insurance saying she needs to do therapy. Patient requests letter from pcp asking for coverage on her behalf.

## 2024-02-20 NOTE — Telephone Encounter (Signed)
 Called & spoke to the patient. Verified name & DOB. Informed patient that per [facility] representative that will be unable to do the appeal at their office and will have to appeal on her end. Patient expressed verbal understanding and would like to be called to pick up the letter when ready.  Denial letter has been scanned into Media dated 01/13/24 for further information.

## 2024-02-20 NOTE — Telephone Encounter (Unsigned)
 Copied from CRM [id number]. Topic: Clinical - Medication Refill >> Feb 20, 2024  1:59 PM Amy B wrote: Medication: glucose blood (FREESTYLE LITE) test strip  Has the patient contacted their pharmacy? Yes (Agent: If no, request that the patient contact the pharmacy for the refill. If patient does not wish to contact the pharmacy document the reason why and proceed with request.) (Agent: If yes, when and what did the pharmacy advise?)  This is the patient's preferred pharmacy:  [facility] [address] [place] - [address] [place] [address] Phone: [phone] Fax: [phone]  Is this the correct pharmacy for this prescription? Yes If no, delete pharmacy and type the correct one.   Has the prescription been filled recently? No  Is the patient out of the medication? Yes  Has the patient been seen for an appointment in the last year OR does the patient have an upcoming appointment? Yes  Can we respond through MyChart? Yes  Agent: Please be advised that Rx refills may take up to 3 business days. We ask that you follow-up with your pharmacy.

## 2024-02-22 MED ORDER — FREESTYLE LITE TEST VI STRP: ORAL_STRIP | 12 refills | Status: DC

## 2024-02-22 NOTE — Telephone Encounter (Signed)
 Requested Prescriptions  Pending Prescriptions Disp Refills   glucose blood (FREESTYLE LITE) test strip 100 each 12    Sig: Use as instructed     Endocrinology: Diabetes - Testing Supplies Passed - 02/22/2024  2:05 PM      Passed - Valid encounter within last 12 months    Recent Outpatient Visits           2 weeks ago Type 2 diabetes mellitus in patient with obesity (HCC)   [facility] Dept Of [place]. [facility] Vicci Sober B, MD   4 months ago Type 2 diabetes mellitus with obesity   [facility] Dept Of [place]. [facility] Vicci Sober B, MD   9 months ago Type 2 diabetes mellitus with obesity   [facility] A Dept Of [place]. [facility] Vicci Sober NOVAK, MD   1 year ago Type 2 diabetes mellitus with obesity   [facility] - A Dept Of [place]. [facility] Vicci Sober NOVAK, MD   1 year ago Medication management   [facility] - A Dept Of . [facility] Fleeta Tonia Garnette LITTIE, RPH-CPP       Future Appointments             In 3 weeks Anner Alm ORN, MD [facility] A Dept of [employer]. [facility] [employer], [employer]

## 2024-02-22 NOTE — Telephone Encounter (Signed)
Can pick up letter tomorrow.

## 2024-02-23 NOTE — Telephone Encounter (Signed)
 Called & spoke to the patient. Verified name & DOB. Informed that letter is ready for pick-up. Patient requested for letter to be sent via mail. Verified address. Letter successfully mailed on 02/23/2024.

## 2024-02-28 ENCOUNTER — Telehealth: Payer: Self-pay | Admitting: Internal Medicine

## 2024-02-28 MED ORDER — ACCU-CHEK SOFTCLIX LANCETS MISC: 6 refills | Status: AC

## 2024-02-28 MED ORDER — ACCU-CHEK GUIDE W/DEVICE KIT: PACK | 0 refills | Status: AC

## 2024-02-28 MED ORDER — ACCU-CHEK GUIDE TEST VI STRP: ORAL_STRIP | 6 refills | Status: AC

## 2024-02-28 NOTE — Telephone Encounter (Signed)
 Copied from CRM [id number]. Topic: Clinical - Prescription Issue >> Feb 28, 2024 12:48 PM Larissa RAMAN wrote:  Reason for CRM: Aleck with [facility] states that the Freestyle Glucose test strips are not covered by patient's insurance. She states she is notified that patient's insurance will cover the Accu-check meter and supplies. She is requesting that a new prescription be sent. Also states that patient has not been testing due to not having supplies.   [facility] [address] [place], [place] - [address] [place] [address] Phone: [phone] Fax: [phone] Hours: Not open 24 hours

## 2024-02-28 NOTE — Telephone Encounter (Signed)
Glucometer testing supplies sent

## 2024-02-28 NOTE — Addendum Note (Signed)
 Addended by: FLEETA MORRIS, Khailee Mick L on: 02/28/2024 05:00 PM   Modules accepted: Orders

## 2024-02-29 NOTE — Telephone Encounter (Signed)
 Called & spoke to the patient. Verified name & DOB. Informed that requested glucometer testing supplies was sent to the preferred pharmacy. Patient expressed verbal understanding. No further assistance at this time.

## 2024-03-14 ENCOUNTER — Other Ambulatory Visit: Payer: Self-pay | Admitting: Neurosurgery

## 2024-03-16 ENCOUNTER — Ambulatory Visit: Admitting: Cardiology

## 2024-03-16 ENCOUNTER — Telehealth: Payer: Self-pay | Admitting: Internal Medicine

## 2024-03-16 NOTE — Telephone Encounter (Signed)
 Copied from CRM [id number]. Topic: Clinical - Medication Question >> Mar 16, 2024  4:34 PM Dedra B wrote:  Reason for CRM: Joen, pharmacist with [facility], called to report that she spoke with pt about increased risk of memory loss, dementia, and feeling confused associated with meclizine  and cyclobenzaprine . Pt said she is willing to discuss safer option with Dr. Vicci at her next appt. Joen would like Dr. Vicci to discuss alternative meds with the pt. Joen will fax over more details and can be reached at [phone] ext [phone].

## 2024-03-26 ENCOUNTER — Telehealth: Payer: Self-pay

## 2024-03-26 NOTE — Telephone Encounter (Signed)
 Copied from CRM [id number]. Topic: Clinical - Prescription Issue >> Mar 26, 2024 12:41 PM Avram MATSU wrote: Reason for CRM: patient stated her insurance pharmacist reached out to her regarding cyclobenzaprine  (FLEXERIL ) 10 MG tablet [[id number]] she needs to switch to another medication because it does not mix well with meclizine  (ANTIVERT ) 25 MG tablet [[id number]]  Change it to tizanidine   Please advise,  [phone] Patient is also having surgery on 1/15  [facility] [address] [place], [place] - [address] [place] [address] Phone: [phone] Fax: [phone]

## 2024-03-26 NOTE — Telephone Encounter (Signed)
 Routing to PCP for review.  Duplicate message

## 2024-03-28 ENCOUNTER — Other Ambulatory Visit: Payer: Self-pay | Admitting: Internal Medicine

## 2024-03-28 MED ORDER — TIZANIDINE HCL 2 MG PO TABS: 2.0000 mg | ORAL_TABLET | Freq: Three times a day (TID) | ORAL | 1 refills | Status: DC | PRN

## 2024-03-28 NOTE — Telephone Encounter (Signed)
 Stop Flexeril . Rxn for Tizanidine  sent to her pharmacy.

## 2024-03-28 NOTE — Telephone Encounter (Signed)
 Called & spoke to the patient. Verified name & DOB. Informed that per Dr.Johnson's message that All muscle relaxants including cyclobenzaprine  (Flexeril ) can cause dizziness and drowsiness in elderly patients.  Options would be to not take Flexeril  on the days that she has to take meclizine . Or stop Flexeril  completely and use topical anti-inflammatory cream like diclofenac also known as Voltaren gel which can be purchased over-the-counter.   Patient stated that she is currently already using Voltaren gel as needed for the cramps. Patient would like to know if she is able to switch to tizanidine  because the pharmacist suggested that it has less side effects. Patient would like to ask if a switch is possible. Please advise.

## 2024-03-28 NOTE — Telephone Encounter (Signed)
 Called & spoke to the patient. Verified name & DOB. Informed to stop Flexeril . Rxn for Tizanidine  sent to her pharmacy. Patient expressed verbal understanding of all discussed.

## 2024-03-28 NOTE — Telephone Encounter (Signed)
 All muscle relaxants including cyclobenzaprine  (Flexeril ) can cause dizziness and drowsiness in elderly patients.  Options would be to not take Flexeril  on the days that she has to take meclizine . Or stop Flexeril  completely and use topical anti-inflammatory cream like diclofenac also known as Voltaren gel which can be purchased over-the-counter.

## 2024-04-04 ENCOUNTER — Other Ambulatory Visit: Payer: Self-pay | Admitting: Internal Medicine

## 2024-04-04 DIAGNOSIS — E559 Vitamin D deficiency, unspecified: Secondary | ICD-10-CM

## 2024-04-17 NOTE — Progress Notes (Deleted)
" °  Cardiology Office Note   Date:  04/17/2024  ID:  [name], DOB January 11, 1956, MRN [id number] PCP: Vicci Barnie NOVAK, MD  [facility] Providers Cardiologist:  Alm Clay, MD { Click to update primary MD,subspecialty MD or APP then REFRESH:1}    History of Present Illness [name] is a 69 y.o. female with history of carotid artery stenosis, hypertension, hyperlipidemia, Sjogren Syndrome, and T2DM.     She was seen by Dr. Clay 11/14/2023 to establish care in the setting of hypertension, chest pressure, edema, and palpitations. She had worsening GERD/chest discomfort with a sensation of acid rising into her chest and throat. She failed omeprazole, metoprolol, avoiding spicy foods, and Nexium.  Chest pressure noted to be atypical. She had previous ischemic evaluation [facility]. Records were not available during last visit. She noted intermittent peripheral edema that was likely exacerbated by steroid use. Recommended PRN furosemide  without concurrent use with hydrochlorothiazide  to avoid hypokalemia.      ROS: All systems negative unless otherwise indicated in HPI.   Studies Reviewed      *** Risk Assessment/Calculations {Does this patient have ATRIAL FIBRILLATION?:[id number]} No BP recorded.  {Refresh Note OR Click here to enter BP  :1}***       Physical Exam VS:  There were no vitals taken for this visit.       Wt Readings from Last 3 Encounters:  02/03/24 212 lb (96.2 kg)  11/22/23 213 lb (96.6 kg)  11/14/23 216 lb (98 kg)    GEN: Well nourished, well developed in no acute distress NECK: No JVD; No carotid bruits CARDIAC: ***RRR, no murmurs, rubs, gallops RESPIRATORY:  Clear to auscultation without rales, wheezing or rhonchi  ABDOMEN: Soft, non-tender, non-distended EXTREMITIES:  No edema; No deformity   ASSESSMENT AND PLAN ***    {Are you ordering a CV Procedure (e.g. stress test, cath, DCCV, TEE, etc)?   Press F2         :[id number]}  Dispo: ***  Signed, Mardy NOVAK Pizza, FNP  "

## 2024-04-18 ENCOUNTER — Ambulatory Visit: Admitting: General Practice

## 2024-04-19 ENCOUNTER — Telehealth (HOSPITAL_BASED_OUTPATIENT_CLINIC_OR_DEPARTMENT_OTHER): Payer: Self-pay | Admitting: *Deleted

## 2024-04-19 NOTE — Telephone Encounter (Signed)
"  ° °  Pre-operative Risk Assessment    Patient Name: [name]  DOB: [birth year] MRN: [id number]   Date of last office visit: 11/14/23 DR ANNER  Date of next office visit: 05/29/24 JOSEFA BEAUVAIS, FNP; 4 MONTH F/U   Request for Surgical Clearance    Procedure:  LUMBAR FUSION  Date of Surgery:  Clearance 04/26/24                                Surgeon:  DR. [name] Surgeon's Group or Practice Name:  [facility]  Phone number:  [phone] Fax number:  [phone] ATTN: LEVON   Type of Clearance Requested:   - Medical    Type of Anesthesia:  General    Additional requests/questions:    Bonney Niels Jest   04/19/2024, 9:49 AM   "

## 2024-04-19 NOTE — Telephone Encounter (Signed)
 Pt needs sooner in office appt, though no openings in time for procedure. I will check with APP is ok to use 04/23/24 appt as pt needs preop clearance as well.    Pt agrees to be seen 04/23/24 Damien Braver, NP @ 2:45. Pt said she was supposed to had been seen yesterday but cancelled b/C she had such back pain and could not walk that far. I informed the pt that we have valet parking and if she needs a wheel chair she can let the valet person know. Pt thanked me for the help and the information.

## 2024-04-19 NOTE — Telephone Encounter (Signed)
" ° °  Name: [name]  DOB: 07-Oct-1955  MRN: [id number]  Primary Cardiologist: Alm Clay, MD  Chart reviewed as part of pre-operative protocol coverage. Because of Chee Dimon Coster's past medical history and time since last visit, she will require a follow-up in-office visit in order to better assess preoperative cardiovascular risk.  Patient is overdue for 12-month follow-up (due 11/2023).  Pre-op covering staff: - Please schedule appointment and call patient to inform them. If patient already had an upcoming appointment within acceptable timeframe, please add pre-op clearance to the appointment notes so provider is aware. - Please contact requesting surgeon's office via preferred method (i.e, phone, fax) to inform them of need for appointment prior to surgery.   Damien JAYSON Braver, NP  04/19/2024, 11:47 AM   "

## 2024-04-19 NOTE — Pre-Procedure Instructions (Addendum)
 Surgical Instructions   Your procedure is scheduled on April 26, 2024. Report to [facility] Main Entrance A at 9:50 A.M., then check in with the Admitting office. Any questions or running late day of surgery: call [phone]  Questions prior to your surgery date: call [phone], Monday-Friday, 8am-4pm. If you experience any cold or flu symptoms such as cough, fever, chills, shortness of breath, etc. between now and your scheduled surgery, please notify us  at the above number.     Remember:  Do not eat or drink after midnight the night before your surgery    Take these medicines the morning of surgery with A SIP OF WATER: Cetirizine HCl (ZYRTEC PO)  cycloSPORINE (RESTASIS) ophthalmic emulsion  dexlansoprazole  (DEXILANT )  nebivolol  (BYSTOLIC )  triamcinolone (NASACORT) nasal inhaler  valACYclovir  (VALTREX )    May take these medicines IF NEEDED: acetaminophen  (TYLENOL )  albuterol  (VENTOLIN  HFA) inhaler - please bring inhaler with you morning of surgery benzonatate  (TESSALON )  EPINEPHrine  Pen fluconazole  (DIFLUCAN )  ipratropium-albuterol  (DUONEB) Inhaler meclizine  (ANTIVERT )  Povidone, PF, (IVIZIA DRY EYES) eye drops tiZANidine  (ZANAFLEX )  traMADol  (ULTRAM )    STOP taking your diclofenac Sodium (VOLTAREN) GEL and meloxicam  (MOBIC ) five days prior to surgery. Do not take any doses after January 9th.  Please follow your prescribing physician's instructions on if/when to stop taking your leflunomide  (ARAVA )    One week prior to surgery, STOP taking any Aspirin (unless otherwise instructed by your surgeon) Aleve, Naproxen, Ibuprofen, Motrin, Advil, Goody's, BC's, all herbal medications, fish oil, and non-prescription vitamins.   HOW TO MANAGE YOUR DIABETES BEFORE AND AFTER SURGERY  Why is it important to control my blood sugar before and after surgery? Improving blood sugar levels before and after surgery helps healing and can limit problems. A way of improving blood  sugar control is eating a healthy diet by:  Eating less sugar and carbohydrates  Increasing activity/exercise  Talking with your doctor about reaching your blood sugar goals High blood sugars (greater than 180 mg/dL) can raise your risk of infections and slow your recovery, so you will need to focus on controlling your diabetes during the weeks before surgery. Make sure that the doctor who takes care of your diabetes knows about your planned surgery including the date and location.  How do I manage my blood sugar before surgery? Check your blood sugar at least 4 times a day, starting 2 days before surgery, to make sure that the level is not too high or low.  Check your blood sugar the morning of your surgery when you wake up and every 2 hours until you get to the Short Stay unit.  If your blood sugar is less than 70 mg/dL, you will need to treat for low blood sugar: Do not take insulin . Treat a low blood sugar (less than 70 mg/dL) with  cup of clear juice (cranberry or apple), 4 glucose tablets, OR glucose gel. Recheck blood sugar in 15 minutes after treatment (to make sure it is greater than 70 mg/dL). If your blood sugar is not greater than 70 mg/dL on recheck, call [phone] for further instructions. Report your blood sugar to the short stay nurse when you get to Short Stay.  If you are admitted to the hospital after surgery: Your blood sugar will be checked by the staff and you will probably be given insulin  after surgery (instead of oral diabetes medicines) to make sure you have good blood sugar levels. The goal for blood sugar control after surgery is 80-180 mg/dL.  Do NOT Smoke (Tobacco/Vaping) for 24 hours prior to your procedure.  If you use a CPAP at night, you may bring your mask/headgear for your overnight stay.   You will be asked to remove any contacts, glasses, piercing's, hearing aid's, dentures/partials prior to surgery. Please bring cases for  these items if needed.    Patients discharged the day of surgery will not be allowed to drive home, and someone needs to stay with them for 24 hours.  SURGICAL WAITING ROOM VISITATION Patients may have no more than 2 support people in the waiting area - these visitors may rotate.   Pre-op nurse will coordinate an appropriate time for 2 ADULT support persons, who may not rotate, to accompany patient in pre-op.  Children under the age of 32 must have an adult with them who is not the patient and must remain in the main waiting area with an adult.  If the patient needs to stay at the hospital during part of their recovery, the visitor guidelines for inpatient rooms apply.  Please refer to the [facility] website for the visitor guidelines for any additional information.   If you received a COVID test during your pre-op visit  it is requested that you wear a mask when out in public, stay away from anyone that may not be feeling well and notify your surgeon if you develop symptoms. If you have been in contact with anyone that has tested positive in the last 10 days please notify you surgeon.      Pre-operative 4 CHG Bathing Instructions   You can play a key role in reducing the risk of infection after surgery. Your skin needs to be as free of germs as possible. You can reduce the number of germs on your skin by washing with CHG (chlorhexidine  gluconate) soap before surgery. CHG is an antiseptic soap that kills germs and continues to kill germs even after washing.   DO NOT use if you have an allergy to chlorhexidine /CHG or antibacterial soaps. If your skin becomes reddened or irritated, stop using the CHG and notify one of our RNs at [phone].   Please shower with the CHG soap starting 4 days before surgery using the following schedule:     Please keep in mind the following:  DO NOT shave, including legs and underarms, starting the day of your first shower.   You may shave your face at  any point before/day of surgery.  Place clean sheets on your bed the day you start using CHG soap. Use a clean washcloth (not used since being washed) for each shower. DO NOT sleep with pets once you start using the CHG.   CHG Shower Instructions:  Wash your face and private area with normal soap. If you choose to wash your hair, wash first with your normal shampoo.  After you use shampoo/soap, rinse your hair and body thoroughly to remove shampoo/soap residue.  Turn the water OFF and apply  bottle of CHG soap to a CLEAN washcloth.  Apply CHG soap ONLY FROM YOUR NECK DOWN TO YOUR TOES (washing for 3-5 minutes)  DO NOT use CHG soap on face, private areas, open wounds, or sores.  Pay special attention to the area where your surgery is being performed.  If you are having back surgery, having someone wash your back for you may be helpful. Wait 2 minutes after CHG soap is applied, then you may rinse off the CHG soap.  Pat dry with a clean towel  Put on clean clothes/pajamas   If you choose to wear lotion, please use ONLY the CHG-compatible lotions that are listed below.  Additional instructions for the day of surgery:  If you choose, you may shower the morning of surgery with an antibacterial soap.  DO NOT APPLY any lotions, deodorants, cologne, or perfumes.   Do not bring valuables to the hospital. [facility] is not responsible for any belongings/valuables. Do not wear nail polish, gel polish, artificial nails, or any other type of covering on natural nails (fingers and toes) Do not wear jewelry or makeup Put on clean/comfortable clothes.  Please brush your teeth.  Ask your nurse before applying any prescription medications to the skin.     CHG Compatible Lotions   Aveeno Moisturizing lotion  Cetaphil Moisturizing Cream  Cetaphil Moisturizing Lotion  Clairol Herbal Essence Moisturizing Lotion, Dry Skin  Clairol Herbal Essence Moisturizing Lotion, Extra Dry Skin  Clairol Herbal  Essence Moisturizing Lotion, Normal Skin  Curel Age Defying Therapeutic Moisturizing Lotion with Alpha Hydroxy  Curel Extreme Care Body Lotion  Curel Soothing Hands Moisturizing Hand Lotion  Curel Therapeutic Moisturizing Cream, Fragrance-Free  Curel Therapeutic Moisturizing Lotion, Fragrance-Free  Curel Therapeutic Moisturizing Lotion, Original Formula  Eucerin Daily Replenishing Lotion  Eucerin Dry Skin Therapy Plus Alpha Hydroxy Crme  Eucerin Dry Skin Therapy Plus Alpha Hydroxy Lotion  Eucerin Original Crme  Eucerin Original Lotion  Eucerin Plus Crme Eucerin Plus Lotion  Eucerin TriLipid Replenishing Lotion  Keri Anti-Bacterial Hand Lotion  Keri Deep Conditioning Original Lotion Dry Skin Formula Softly Scented  Keri Deep Conditioning Original Lotion, Fragrance Free Sensitive Skin Formula  Keri Lotion Fast Absorbing Fragrance Free Sensitive Skin Formula  Keri Lotion Fast Absorbing Softly Scented Dry Skin Formula  Keri Original Lotion  Keri Skin Renewal Lotion Keri Silky Smooth Lotion  Keri Silky Smooth Sensitive Skin Lotion  Nivea Body Creamy Conditioning Oil  Nivea Body Extra Enriched Lotion  Nivea Body Original Lotion  Nivea Body Sheer Moisturizing Lotion Nivea Crme  Nivea Skin Firming Lotion  NutraDerm 30 Skin Lotion  NutraDerm Skin Lotion  NutraDerm Therapeutic Skin Cream  NutraDerm Therapeutic Skin Lotion  ProShield Protective Hand Cream  Provon moisturizing lotion  Please read over the following fact sheets that you were given.

## 2024-04-20 ENCOUNTER — Encounter (HOSPITAL_COMMUNITY): Payer: Self-pay

## 2024-04-20 ENCOUNTER — Other Ambulatory Visit: Payer: Self-pay | Admitting: Internal Medicine

## 2024-04-20 ENCOUNTER — Encounter (HOSPITAL_COMMUNITY)
Admission: RE | Admit: 2024-04-20 | Discharge: 2024-04-20 | Disposition: A | Source: Ambulatory Visit | Attending: Neurosurgery

## 2024-04-20 ENCOUNTER — Other Ambulatory Visit: Payer: Self-pay

## 2024-04-20 VITALS — BP 123/72 | HR 57 | Temp 98.0°F | Resp 17 | Ht 66.0 in | Wt 222.6 lb

## 2024-04-20 DIAGNOSIS — I7300 Raynaud's syndrome without gangrene: Secondary | ICD-10-CM | POA: Insufficient documentation

## 2024-04-20 DIAGNOSIS — J4520 Mild intermittent asthma, uncomplicated: Secondary | ICD-10-CM

## 2024-04-20 DIAGNOSIS — J45909 Unspecified asthma, uncomplicated: Secondary | ICD-10-CM | POA: Diagnosis not present

## 2024-04-20 DIAGNOSIS — Z6836 Body mass index (BMI) 36.0-36.9, adult: Secondary | ICD-10-CM | POA: Diagnosis not present

## 2024-04-20 DIAGNOSIS — R002 Palpitations: Secondary | ICD-10-CM | POA: Diagnosis not present

## 2024-04-20 DIAGNOSIS — K219 Gastro-esophageal reflux disease without esophagitis: Secondary | ICD-10-CM | POA: Insufficient documentation

## 2024-04-20 DIAGNOSIS — E66812 Obesity, class 2: Secondary | ICD-10-CM | POA: Insufficient documentation

## 2024-04-20 DIAGNOSIS — I10 Essential (primary) hypertension: Secondary | ICD-10-CM | POA: Insufficient documentation

## 2024-04-20 DIAGNOSIS — M064 Inflammatory polyarthropathy: Secondary | ICD-10-CM | POA: Diagnosis not present

## 2024-04-20 DIAGNOSIS — Z01812 Encounter for preprocedural laboratory examination: Secondary | ICD-10-CM | POA: Insufficient documentation

## 2024-04-20 DIAGNOSIS — R0789 Other chest pain: Secondary | ICD-10-CM | POA: Diagnosis not present

## 2024-04-20 DIAGNOSIS — Z79899 Other long term (current) drug therapy: Secondary | ICD-10-CM | POA: Insufficient documentation

## 2024-04-20 DIAGNOSIS — I2510 Atherosclerotic heart disease of native coronary artery without angina pectoris: Secondary | ICD-10-CM

## 2024-04-20 DIAGNOSIS — Z01818 Encounter for other preprocedural examination: Secondary | ICD-10-CM

## 2024-04-20 DIAGNOSIS — E119 Type 2 diabetes mellitus without complications: Secondary | ICD-10-CM | POA: Diagnosis not present

## 2024-04-20 DIAGNOSIS — M3500 Sicca syndrome, unspecified: Secondary | ICD-10-CM | POA: Diagnosis not present

## 2024-04-20 HISTORY — DX: Other complications of anesthesia, initial encounter: T88.59XA

## 2024-04-20 HISTORY — DX: Cardiac murmur, unspecified: R01.1

## 2024-04-20 HISTORY — DX: Headache, unspecified: R51.9

## 2024-04-20 HISTORY — DX: Gastro-esophageal reflux disease without esophagitis: K21.9

## 2024-04-20 LAB — BASIC METABOLIC PANEL WITH GFR
Anion gap: 10 (ref 5–15)
BUN: 19 mg/dL (ref 8–23)
CO2: 28 mmol/L (ref 22–32)
Calcium: 9.5 mg/dL (ref 8.9–10.3)
Chloride: 104 mmol/L (ref 98–111)
Creatinine, Ser: 0.76 mg/dL (ref 0.44–1.00)
GFR, Estimated: >60 mL/min
Glucose, Bld: 98 mg/dL (ref 70–99)
Potassium: 3.8 mmol/L (ref 3.5–5.1)
Sodium: 142 mmol/L (ref 135–145)

## 2024-04-20 LAB — TYPE AND SCREEN
ABO/RH(D): B POS
Antibody Screen: NEGATIVE

## 2024-04-20 LAB — CBC
HCT: 41.8 % (ref 36.0–46.0)
Hemoglobin: 13.4 g/dL (ref 12.0–15.0)
MCH: 27 pg (ref 26.0–34.0)
MCHC: 32.1 g/dL (ref 30.0–36.0)
MCV: 84.3 fL (ref 80.0–100.0)
Platelets: 194 K/uL (ref 150–400)
RBC: 4.96 MIL/uL (ref 3.87–5.11)
RDW: 13.2 % (ref 11.5–15.5)
WBC: 5.5 K/uL (ref 4.0–10.5)
nRBC: 0 % (ref 0.0–0.2)

## 2024-04-20 LAB — SURGICAL PCR SCREEN
MRSA, PCR: NEGATIVE
Staphylococcus aureus: NEGATIVE

## 2024-04-20 LAB — HEMOGLOBIN A1C
Hgb A1c MFr Bld: 6.1 % — ABNORMAL HIGH (ref 4.8–5.6)
Mean Plasma Glucose: 128.37 mg/dL

## 2024-04-20 LAB — GLUCOSE, CAPILLARY: Glucose-Capillary: 89 mg/dL (ref 70–99)

## 2024-04-20 NOTE — Progress Notes (Signed)
 PCP - Dr. Barnie Louder Cardiologist - Dr. Alm Clay - last office visit 11/14/2023  PPM/ICD - Denies Device Orders - n/a Rep Notified - n/a  Chest x-ray - n/a EKG - 11/14/2023 Stress Test - Records requested ECHO - Records Requested Cardiac Cath - Records Requested  Sleep Study - Denies CPAP - n/a  Pt is DM2. She checks her blood sugar 2x/day most days. Normal fasting glucose 110-120s. CBG at pre-op appointment 89. A1c result pending.  Last dose of GLP1 agonist- n/a GLP1 instructions: n/a  Blood Thinner Instructions: n/a Aspirin Instructions: n/a  NPO after midnight  COVID TEST- n/a   Anesthesia review: Yes. Pending cardiac clearance. Appointment scheduled for 04/23/2024. Cardiac testing results requested from [facility].    Patient denies shortness of breath, fever, cough and chest pain at PAT appointment. Pt denies any respiratory illness/infection in the last two months.   All instructions explained to the patient, with a verbal understanding of the material. Patient agrees to go over the instructions while at home for a better understanding. Patient also instructed to self quarantine after being tested for COVID-19. The opportunity to ask questions was provided.

## 2024-04-22 NOTE — Progress Notes (Unsigned)
 " Cardiology Office Note:    Date:  04/23/2024   ID:  [name], DOB 01-27-1956, MRN [id number]  PCP:  Vicci Barnie NOVAK, MD   [facility] Providers Cardiologist:  Alm Clay, MD     Referring MD: Vicci Barnie NOVAK, MD   Chief Complaint  Patient presents with   Pre-op Exam    Preoperative cardiac evaluation   History of Present Illness:    [name] is a 69 y.o. female with a hx of hypertension, hyperlipidemia, type 2 diabetes, GERD, Sjogrens, Raynaud's, asthma, gout, thyroid  nodules, chronic chest pain. She presents to clinic today to undergo pre-operative cardiac risk assessment prior to undergoing lumbar fusion by Dr. [name] at [facility].   There are notes indicating that she had a cardiac cath at [facility]  in 2019 that showed normal coronaries. There are multiple mentions of requesting records from [facility]. The records have been obtained and placed under media under the date 04/23/2024. These records showed: stress echo from 05/2015 that showed normal function no stress-induced wall motion abnormalities. Echo from 05/2015 showed: LVEF 55-60%, normal RV systolic function, normal valves. Cardiac cath from 06/2014 showed normal coronaries, mild scattered plaquing bu no high-grade epicardial coronary atherosclerosis.  She was seen by Dr. Clay in August 2025 for chronic chest pain. He notes that overall her chest pain is fairly atypical. Due to her having previous ischemic evaluations completed in the past, Dr. Clay was wanting to wait for her records to come back from [facility]. In regards to her hyperlipidemia, she was on Lipitor 10 mg M/W/F. For her hypertension she was on hydrochlorothiazide  25 mg BID, nebivolol  5 mg daily. She has PRN Lasix  20 mg for peripheral edema.   Today in clinic, she is doing very well. She reports no cardiac symptoms at rest or with exertion. She reports that she is able to do what she needs to  do, she is just slightly limited by her recent ankle surgery still healing and her chronic back pain. She denies any chest pain, shortness of breath, dizziness, lightheadedness.  We reviewed records from [facility] hospital, patient confirms all data.   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   HTN (hypertension)    Joint pain    Lactose intolerance    Multiple food allergies    OA (osteoarthritis)    Reflux    Sjogren's syndrome    SOB (shortness of breath)    Swallowing difficulty    Thyroid  condition    Vitamin D  deficiency    Past Surgical History:  Procedure Laterality Date   ANKLE SURGERY Right 2024   APPENDECTOMY     BACK SURGERY  03/12/2010   L5   BREAST LUMPECTOMY Left    BREAST SURGERY     BIOPSY   HERNIA REPAIR     groin bilateral   LEFT HEART CATH AND CORONARY ANGIOGRAPHY  2019   [facility]: Normal coronaries   LEFT  HEART CATH AND CORONARY ANGIOGRAPHY  2020   [facility] [place], [place].  Normal   NECK SURGERY     PELVIC LAPAROSCOPY  [id number]   PILONIDAL CYST EXCISION     SALPINGOOPHORECTOMY  10/11/2002   SCOPE LSO-LEFT SEROUS CYSTADENOMA   SALPINGOOPHORECTOMY  04/12/2005   RSO/TOA   SHOULDER SURGERY Right 01/2018   TONSILLECTOMY AND ADENOIDECTOMY     TUBAL LIGATION     VAGINAL HYSTERECTOMY  01/10/2005   [facility], POSTERIOR REPAIR   Current Medications: Current Outpatient Medications  Medication Instructions   Accu-Chek Softclix Lancets lancets Use to check blood sugar 3 times daily.   acetaminophen  (TYLENOL ) 650-1,300 mg, Every 8 hours PRN   albuterol  (VENTOLIN  HFA) 108 (90 Base)  MCG/ACT inhaler 2 puffs, Inhalation, Every 6 hours PRN   Artificial Saliva (SALESE/XYLITOL) LOZG 1 lozenge, As needed   Artificial Saliva (THERABREATH DRY MOUTH) LOZG 1 lozenge, As needed   ascorbic acid (VITAMIN C) 500 mg, Daily PRN   atorvastatin  (LIPITOR) 10 MG tablet 1 tab PO Q Mon/Wed/Frid   b complex vitamins capsule 1 capsule, Every morning   benzonatate  (TESSALON ) 200 MG capsule Take 1 capsule by mouth three times daily as needed for cough   Blood Glucose Monitoring Suppl (ACCU-CHEK GUIDE) w/Device KIT Use to check blood sugar 3 times daily.   cetirizine (ZYRTEC) 10 mg, Daily at bedtime   CINNAMON PO 2 tablets, Daily   dexlansoprazole  (DEXILANT ) 60 mg, Oral, Daily   diclofenac Sodium (VOLTAREN) 2 g, 4 times daily PRN   EPINEPHrine  (EPI-PEN) 0.3 mg, Intramuscular, As needed   fluconazole  (DIFLUCAN ) 150 mg, Oral, Daily   furosemide  (LASIX ) 20 mg, Oral, Daily PRN   glucose 4 GM chewable tablet 4 tablets, Daily PRN   glucose blood (ACCU-CHEK GUIDE TEST) test strip Use to check blood sugar 3 times daily.   hydrochlorothiazide  (HYDRODIURIL ) 25 mg, Oral, 2 times daily   Hydrocortisone (CORTIZONE-10 EX) 1 Application, As needed   ipratropium-albuterol  (DUONEB) 0.5-2.5 (3) MG/3ML SOLN USE 1 AMPULE IN NEBULIZER EVERY 6 HOURS AS NEEDED   leflunomide  (ARAVA ) 20 mg, Every morning   meclizine  (ANTIVERT ) 25 mg, Oral, 2 times daily PRN   meloxicam  (MOBIC ) 15 mg, Oral, Daily   montelukast  (SINGULAIR ) 10 mg, Oral, Daily at bedtime   nebivolol  (BYSTOLIC ) 5 mg, Oral, Every morning   pilocarpine (SALAGEN) 5 mg, 3 times daily PRN   polyethylene glycol (MIRALAX / GLYCOLAX) 17 g, Daily PRN   Povidone, PF, (IVIZIA DRY EYES) 0.5 % SOLN 1 drop, 2 times daily   tiZANidine  (ZANAFLEX ) 2 mg, Oral, Every 8 hours PRN   traMADol  (ULTRAM ) 50 mg, Oral, Every 8 hours PRN   triamcinolone (NASACORT) 55 MCG/ACT nasal inhaler 2 sprays, Every morning   triamcinolone cream (KENALOG) 0.1 % 1 Application, 2 times daily  PRN   valACYclovir  (VALTREX ) 500 mg, Oral, Daily   Vitamin D  (Ergocalciferol ) (DRISDOL ) 50,000 Units, Oral, Weekly   vitamin E (VITAMIN E) 400 Units, 2 times daily   Allergies:   Augmentin [amoxicillin-pot clavulanate], Bee venom, Codeine, Duratuss [phenylephrine -guaifenesin], Erythromycin, Hydrocodone-acetaminophen , Iodine, Latex, Oxycodone, Peanuts [nuts], Penicillins, Shellfish allergy, Vicodin [hydrocodone-acetaminophen ], Grass pollen(k-o-r-t-swt vern), Ace inhibitors, Methotrexate and trimetrexate, Mushroom, Phentermine, and Plaquenil [hydroxychloroquine]   Social History   Socioeconomic History   Marital status: Single    Spouse name: Not on file   Number of children: Not on file   Years of education: Not on file   Highest education level: Bachelor's degree (e.g., BA, AB, BS)  Occupational History  Not on file  Tobacco Use   Smoking status: Never   Smokeless tobacco: Never  Vaping Use   Vaping status: Never Used  Substance and Sexual Activity   Alcohol use: Not Currently    Comment: Rare   Drug use: No   Sexual activity: Not Currently    Birth control/protection: Post-menopausal  Other Topics Concern   Not on file  Social History Narrative   - Employment: 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   - Partner Status: Married   - Living Situation: Lives in her hometown   Social Drivers of Health   Tobacco Use: Low Risk (04/23/2024)   Patient History    Smoking Tobacco Use: Never    Smokeless Tobacco Use: Never    Passive Exposure: Not on file  Recent Concern: Tobacco Use - Medium Risk (02/03/2024)   Patient History    Smoking Tobacco Use: Former    Smokeless Tobacco Use: Never    Passive Exposure: Not on Actuary Strain: Low Risk (11/22/2023)   Overall Financial Resource Strain (CARDIA)    Difficulty of Paying Living Expenses: Not hard at all  Food Insecurity: No Food  Insecurity (11/22/2023)   Epic    Worried About Programme Researcher, Broadcasting/film/video in the Last Year: Never true    Ran Out of Food in the Last Year: Never true  Transportation Needs: No Transportation Needs (11/22/2023)   Epic    Lack of Transportation (Medical): No    Lack of Transportation (Non-Medical): No  Physical Activity: Inactive (11/22/2023)   Exercise Vital Sign    Days of Exercise per Week: 0 days    Minutes of Exercise per Session: 0 min  Stress: No Stress Concern Present (11/22/2023)   [employer] of Occupational Health - Occupational Stress Questionnaire    Feeling of Stress: Only a little  Social Connections: Moderately Integrated (11/22/2023)   Social Connection and Isolation Panel    Frequency of Communication with Friends and Family: More than three times a week    Frequency of Social Gatherings with Friends and Family: More than three times a week    Attends Religious Services: More than 4 times per year    Active Member of Clubs or Organizations: Yes    Attends Banker Meetings: More than 4 times per year    Marital Status: Divorced  Depression (PHQ2-9): Medium Risk (02/03/2024)   Depression (PHQ2-9)    PHQ-2 Score: 5  Alcohol Screen: Low Risk (11/22/2023)   Alcohol Screen    Last Alcohol Screening Score (AUDIT): 0  Housing: Low Risk (11/22/2023)   Epic    Unable to Pay for Housing in the Last Year: No    Number of Times Moved in the Last Year: 0    Homeless in the Last Year: No  Utilities: Not At Risk (11/22/2023)   Epic    Threatened with loss of utilities: No  Health Literacy: Adequate Health Literacy (11/22/2023)   B1300 Health Literacy    Frequency of need for help with medical instructions: Never    Family History: The patient's family history includes Alcohol abuse in her father; Anxiety disorder in her father; Arthritis in her mother; Breast cancer in her maternal aunt; Cancer in her father and paternal grandmother; Heart disease in her father and  mother; Hyperlipidemia in her mother; Hypertension in her father and mother; Ovarian cancer in her maternal aunt; Stroke  in her mother.  ROS:   Please see the history of present illness.    All other systems reviewed and are negative.  EKGs/Labs/Other Studies Reviewed:    The following studies were reviewed today: EKG Interpretation Date/Time:  Monday April 23 2024 14:52:55 EST Ventricular Rate:  62 PR Interval:  122 QRS Duration:  80 QT Interval:  410 QTC Calculation: 416 R Axis:   -8  Text Interpretation: Normal sinus rhythm  No significant change since last tracing  Confirmed by NATALIA BIRMINGHAM ([id number]) on 04/23/2024 2:56:59 PM    Recent Labs: 10/04/2023: ALT 45; BNP 9.7; TSH 2.850 04/20/2024: BUN 19; Creatinine, Ser 0.76; Hemoglobin 13.4; Platelets 194; Potassium 3.8; Sodium 142  Recent Lipid Panel    Component Value Date/Time   CHOL 180 10/04/2023 1335   TRIG 145 10/04/2023 1335   HDL 49 10/04/2023 1335   CHOLHDL 3.7 10/04/2023 1335   LDLCALC 105 (H) 10/04/2023 1335   Risk Assessment/Calculations:               Physical Exam:    VS:  BP 120/80 (Cuff Size: Large)   Ht 5' 6 (1.676 m)   Wt 223 lb (101.2 kg)   BMI 35.99 kg/m     Wt Readings from Last 3 Encounters:  04/23/24 223 lb (101.2 kg)  04/20/24 222 lb 9.6 oz (101 kg)  02/03/24 212 lb (96.2 kg)    GEN:  Well nourished, well developed in no acute distress HEENT: Normal NECK: No JVD; No carotid bruits LYMPHATICS: No lymphadenopathy CARDIAC: RRR, no murmurs, rubs, gallops RESPIRATORY:  Clear to auscultation without rales, wheezing or rhonchi  ABDOMEN: Soft, non-tender, non-distended MUSCULOSKELETAL:  No edema; No deformity  SKIN: Warm and dry NEUROLOGIC:  Alert and oriented x 3 PSYCHIATRIC:  Normal affect   ASSESSMENT:    1. Preoperative cardiovascular examination   2. Essential hypertension   3. Hyperlipidemia associated with type 2 diabetes mellitus (HCC)   4. Peripheral edema    PLAN:     Preoperative cardiac evaluation  Requested by Dr. [name] prior to undergoing lumbar fusion  Echo from 05/2015 showed: LVEF 55-60%, normal RV systolic function, normal valves Cardiac cath from 06/2014 showed normal coronaries, mild scattered plaquing bu no high-grade epicardial coronary atherosclerosis No active cardiac symptoms (chest pain, dyspnea, syncope, palpitations) No history of recent ACS, decompensated heart failure, significant arrhythmias or severe valvular disease Functional capacity is slightly limited due to chronic back pain and arthritis but patient still able to achieve > 4 METS with no symptoms RCRI is 0, indicating perioperative risk of major cardiac event is 0.4% EKG showed sinus rhythm, no acute changes  Therefore, based on ACC/AHA guidelines, patient would be at acceptable risk for the planned procedure without further cardiovascular testing. I will route this recommendation to the requesting party via Epic fax function  Hypertension Home meds: hydrochlorothiazide  25 mg BID, nebivolol  5 mg daily   BP today 120/80 Reports similar BP readings at home  Denies any symptoms  Continue regimen as above  Hyperlipidemia, goal LDL < 70 Home meds: Lipitor 10 mg M/W/F 10/04/2023: ALT 45; HDL 49; LDL Chol Calc (NIH) 105  Lipid therapy managed by PCP  Peripheral edema Known history of chronic peripheral edema Home meds: PRN Lasix  20 mg daily Previously instructed to not use with hydrochlorothiazide  Reports some mild LE edema, plans on taking Lasix  tomorrow x 1 dose     Medication Adjustments/Labs and Tests Ordered: Current medicines are reviewed at length with the  patient today.  Concerns regarding medicines are outlined above.  Orders Placed This Encounter  Procedures   EKG 12-Lead   No orders of the defined types were placed in this encounter.  Patient Instructions  Medication Instructions:  Your physician recommends that you continue on your current medications as  directed. Please refer to the Current Medication list given to you today.  *If you need a refill on your cardiac medications before your next appointment, please call your pharmacy*  Lab Work: NONE ordered at this time of appointment   Testing/Procedures: NONE ordered at this time of appointment    Follow-Up: At [facility], you and your health needs are our priority.  As part of our continuing mission to provide you with exceptional heart care, our providers are all part of one team.  This team includes your primary Cardiologist (physician) and Advanced Practice Providers or APPs (Physician Assistants and Nurse Practitioners) who all work together to provide you with the care you need, when you need it.  Your next appointment:   1 year(s)  Provider:   Alm Clay, MD    We recommend signing up for the patient portal called MyChart.  Sign up information is provided on this After Visit Summary.  MyChart is used to connect with patients for Virtual Visits (Telemedicine).  Patients are able to view lab/test results, encounter notes, upcoming appointments, etc.  Non-urgent messages can be sent to your provider as well.   To learn more about what you can do with MyChart, go to [email or web address].   Other Instructions             Signed, Waddell DELENA Donath, PA-C  04/23/2024 4:00 PM    [facility]  "

## 2024-04-23 ENCOUNTER — Encounter: Payer: Self-pay | Admitting: Nurse Practitioner

## 2024-04-23 ENCOUNTER — Ambulatory Visit: Attending: Nurse Practitioner | Admitting: Cardiology

## 2024-04-23 VITALS — BP 120/80 | Ht 66.0 in | Wt 223.0 lb

## 2024-04-23 DIAGNOSIS — Z01810 Encounter for preprocedural cardiovascular examination: Secondary | ICD-10-CM | POA: Diagnosis not present

## 2024-04-23 DIAGNOSIS — E1169 Type 2 diabetes mellitus with other specified complication: Secondary | ICD-10-CM

## 2024-04-23 DIAGNOSIS — R600 Localized edema: Secondary | ICD-10-CM | POA: Diagnosis not present

## 2024-04-23 DIAGNOSIS — E785 Hyperlipidemia, unspecified: Secondary | ICD-10-CM | POA: Diagnosis not present

## 2024-04-23 DIAGNOSIS — I10 Essential (primary) hypertension: Secondary | ICD-10-CM | POA: Diagnosis not present

## 2024-04-23 NOTE — Progress Notes (Signed)
 Anesthesia Chart Review:  69 year old female follows with cardiology for history of HTN, palpitations, chronic chest pain, intermittent peripheral edema, Sjogren syndrome, Raynaud's.  She is noted to have chronic GERD and failed treatment with omeprazole, esomeprazole, pantoprazole .  Echo 2017 showed LVEF 55 to 60%, normal RV, no significant valvular abnormalities.  Cath 2016 showed essentially normal left dominant coronary arteries.  Seen by Waddell Donath, PA-C on 04/23/2024 for preop evaluation.  Per note, Requested by Dr. [name] prior to undergoing lumbar fusion. Echo from 05/2015 showed: LVEF 55-60%, normal RV systolic function, normal valves. Cardiac cath from 06/2014 showed normal coronaries, mild scattered plaquing bu no high-grade epicardial coronary atherosclerosis. No active cardiac symptoms (chest pain, dyspnea, syncope, palpitations). No history of recent ACS, decompensated heart failure, significant arrhythmias or severe valvular disease. Functional capacity is slightly limited due to chronic back pain and arthritis but patient still able to achieve > 4 METS with no symptoms RCRI is 0, indicating perioperative risk of major cardiac event is 0.4%. EKG showed sinus rhythm, no acute changes. Therefore, based on ACC/AHA guidelines, patient would be at acceptable risk for the planned procedure without further cardiovascular testing.  Other pertinent history includes non-insulin -dependent DM2, asthma, inflammatory arthritis on leflunomide , class II obesity BMI 36, cervical stenosis s/p C C5-7 ACDF.  Patient reports prolonged emergence after ankle surgery in 2024.  Preop labs reviewed, WNL.  DM2 well-controlled with A1c 6.1.  EKG 04/23/2024: NSR.  Rate 62.  TTE 05/17/2015: Interpretation summary: The estimated ejection fraction is 55 to 60%. The right ventricular systolic function is normal. Right ventricular systolic pressure is 29 mmHg. There is no pericardial effusion. Normal left ventricular  diastolic function. E/e' ratio is <10, suggestive of normal left ventricular filling pressure.  Cath 06/21/2014: Impression/plan: Postoperative diagnosis: 1.  Essentially normal left dominant coronary arteries with mild scattered plaquing but no high-grade epicardial coronary atherosclerosis. 2.  Normal to hyperdynamic LV systolic function.  There is subtle inferior hypokinesis which may explain the wall motion abnormalities seen by stress echocardiography. 3.  No angiographic evidence for renal artery stenosis, abdominal aortic aneurysm or significant aortoiliac disease. 4.  Presumed false positive stress echocardiogram.  Plan: 1.  Medical management of mild coronary plaquing underlying hypertension. 2.  Discussed with Dr. Stoney.  She plans outpatient CT angiogram to exclude pulmonary and/or aortic pathology.    Lynwood Geofm RIGGERS [facility] Stay Center/Anesthesiology Phone [phone] 04/23/2024 4:13 PM

## 2024-04-23 NOTE — Patient Instructions (Signed)
 Medication Instructions:  Your physician recommends that you continue on your current medications as directed. Please refer to the Current Medication list given to you today.  *If you need a refill on your cardiac medications before your next appointment, please call your pharmacy*  Lab Work: NONE ordered at this time of appointment   Testing/Procedures: NONE ordered at this time of appointment   Follow-Up: At [facility], you and your health needs are our priority.  As part of our continuing mission to provide you with exceptional heart care, our providers are all part of one team.  This team includes your primary Cardiologist (physician) and Advanced Practice Providers or APPs (Physician Assistants and Nurse Practitioners) who all work together to provide you with the care you need, when you need it.  Your next appointment:   1 year(s)  Provider:   Alm Clay, MD    We recommend signing up for the patient portal called MyChart.  Sign up information is provided on this After Visit Summary.  MyChart is used to connect with patients for Virtual Visits (Telemedicine).  Patients are able to view lab/test results, encounter notes, upcoming appointments, etc.  Non-urgent messages can be sent to your provider as well.   To learn more about what you can do with MyChart, go to [email or web address].   Other Instructions

## 2024-04-23 NOTE — Anesthesia Preprocedure Evaluation (Signed)
 "                                  Anesthesia Evaluation  Patient identified by MRN, date of birth, ID band Patient awake    Reviewed: Allergy & Precautions, NPO status , Patient's Chart, lab work & pertinent test results, reviewed documented beta blocker date and time   History of Anesthesia Complications (+) PONV  Airway Mallampati: I  TM Distance: >3 FB Neck ROM: Full    Dental  (+) Caps, Dental Advisory Given   Pulmonary asthma , COPD,  COPD inhaler   breath sounds clear to auscultation       Cardiovascular hypertension, Pt. on medications and Pt. on home beta blockers (-) angina (-) CAD (essentially normal coronaries by cath)  Rhythm:Regular Rate:Normal  Echo from 05/2015 showed: LVEF 55-60%, normal RV systolic function, normal valves   Neuro/Psych  Headaches Back pain: tramadol     GI/Hepatic Neg liver ROS,GERD  Controlled and Medicated,,  Endo/Other  diabetes (glu 119)  Sjogren's BMI 36  Renal/GU negative Renal ROS     Musculoskeletal   Abdominal   Peds  Hematology Hb 13.4, plt 194k   Anesthesia Other Findings   Reproductive/Obstetrics                              Anesthesia Physical Anesthesia Plan  ASA: 3  Anesthesia Plan: General   Post-op Pain Management: Tylenol  PO (pre-op)*   Induction: Intravenous  PONV Risk Score and Plan: 3 and Ondansetron , Dexamethasone  and Treatment may vary due to age or medical condition  Airway Management Planned: Oral ETT  Additional Equipment: None  Intra-op Plan:   Post-operative Plan: Extubation in OR  Informed Consent: I have reviewed the patients History and Physical, chart, labs and discussed the procedure including the risks, benefits and alternatives for the proposed anesthesia with the patient or authorized representative who has indicated his/her understanding and acceptance.     Dental advisory given  Plan Discussed with: CRNA and Surgeon  Anesthesia Plan  Comments: (PAT note by Lynwood Hope, PA-C: 69 year old female follows with cardiology for history of HTN, palpitations, chronic chest pain, intermittent peripheral edema, Sjogren syndrome, Raynaud's.  She is noted to have chronic GERD and failed treatment with omeprazole, esomeprazole, pantoprazole .  Echo 2017 showed LVEF 55 to 60%, normal RV, no significant valvular abnormalities.  Cath 2016 showed essentially normal left dominant coronary arteries.  Seen by Waddell Donath, PA-C on 04/23/2024 for preop evaluation.  Per note, Requested by Dr. [name] prior to undergoing lumbar fusion. Echo from 05/2015 showed: LVEF 55-60%, normal RV systolic function, normal valves. Cardiac cath from 06/2014 showed normal coronaries, mild scattered plaquing bu no high-grade epicardial coronary atherosclerosis. No active cardiac symptoms (chest pain, dyspnea, syncope, palpitations). No history of recent ACS, decompensated heart failure, significant arrhythmias or severe valvular disease. Functional capacity is slightly limited due to chronic back pain and arthritis but patient still able to achieve > 4 METS with no symptoms RCRI is 0, indicating perioperative risk of major cardiac event is 0.4%. EKG showed sinus rhythm, no acute changes. Therefore, based on ACC/AHA guidelines, patient would be at acceptable risk for the planned procedure without further cardiovascular testing.  Other pertinent history includes non-insulin -dependent DM2, asthma, inflammatory arthritis on leflunomide , class II obesity BMI 36, cervical stenosis s/p C C5-7 ACDF.  Patient reports prolonged emergence after  ankle surgery in 2024.  Preop labs reviewed, WNL.  DM2 well-controlled with A1c 6.1.  EKG 04/23/2024: NSR.  Rate 62.  TTE 05/17/2015: Interpretation summary: The estimated ejection fraction is 55 to 60%. The right ventricular systolic function is normal. Right ventricular systolic pressure is 29 mmHg. There is no pericardial effusion. Normal  left ventricular diastolic function. E/e' ratio is <10, suggestive of normal left ventricular filling pressure.  Cath 06/21/2014: Impression/plan: Postoperative diagnosis: 1.  Essentially normal left dominant coronary arteries with mild scattered plaquing but no high-grade epicardial coronary atherosclerosis. 2.  Normal to hyperdynamic LV systolic function.  There is subtle inferior hypokinesis which may explain the wall motion abnormalities seen by stress echocardiography. 3.  No angiographic evidence for renal artery stenosis, abdominal aortic aneurysm or significant aortoiliac disease. 4.  Presumed false positive stress echocardiogram.  Plan: 1.  Medical management of mild coronary plaquing underlying hypertension. 2.  Discussed with Dr. Stoney.  She plans outpatient CT angiogram to exclude pulmonary and/or aortic pathology.   )         Anesthesia Quick Evaluation  "

## 2024-04-26 ENCOUNTER — Other Ambulatory Visit: Payer: Self-pay

## 2024-04-26 ENCOUNTER — Ambulatory Visit (HOSPITAL_COMMUNITY): Admitting: Anesthesiology

## 2024-04-26 ENCOUNTER — Ambulatory Visit (HOSPITAL_COMMUNITY)

## 2024-04-26 ENCOUNTER — Encounter (HOSPITAL_COMMUNITY): Payer: Self-pay | Admitting: Neurosurgery

## 2024-04-26 ENCOUNTER — Encounter (HOSPITAL_COMMUNITY): Admitting: Vascular Surgery

## 2024-04-26 ENCOUNTER — Ambulatory Visit (HOSPITAL_COMMUNITY)
Admission: RE | Admit: 2024-04-26 | Discharge: 2024-04-27 | Disposition: A | Discharge status: Home / Self Care | Attending: Neurosurgery | Admitting: Neurosurgery

## 2024-04-26 DIAGNOSIS — J449 Chronic obstructive pulmonary disease, unspecified: Secondary | ICD-10-CM | POA: Diagnosis not present

## 2024-04-26 DIAGNOSIS — I10 Essential (primary) hypertension: Secondary | ICD-10-CM | POA: Insufficient documentation

## 2024-04-26 DIAGNOSIS — K219 Gastro-esophageal reflux disease without esophagitis: Secondary | ICD-10-CM | POA: Diagnosis not present

## 2024-04-26 DIAGNOSIS — M419 Scoliosis, unspecified: Secondary | ICD-10-CM | POA: Diagnosis not present

## 2024-04-26 DIAGNOSIS — J4489 Other specified chronic obstructive pulmonary disease: Secondary | ICD-10-CM | POA: Diagnosis not present

## 2024-04-26 DIAGNOSIS — E119 Type 2 diabetes mellitus without complications: Secondary | ICD-10-CM

## 2024-04-26 DIAGNOSIS — M48061 Spinal stenosis, lumbar region without neurogenic claudication: Secondary | ICD-10-CM | POA: Diagnosis not present

## 2024-04-26 DIAGNOSIS — M48062 Spinal stenosis, lumbar region with neurogenic claudication: Secondary | ICD-10-CM | POA: Diagnosis not present

## 2024-04-26 DIAGNOSIS — M4316 Spondylolisthesis, lumbar region: Secondary | ICD-10-CM | POA: Insufficient documentation

## 2024-04-26 DIAGNOSIS — M3500 Sicca syndrome, unspecified: Secondary | ICD-10-CM | POA: Diagnosis not present

## 2024-04-26 DIAGNOSIS — M5416 Radiculopathy, lumbar region: Secondary | ICD-10-CM | POA: Diagnosis not present

## 2024-04-26 LAB — GLUCOSE, CAPILLARY
Glucose-Capillary: 119 mg/dL — ABNORMAL HIGH (ref 70–99)
Glucose-Capillary: 95 mg/dL (ref 70–99)
Glucose-Capillary: 159 mg/dL — ABNORMAL HIGH (ref 70–99)
Glucose-Capillary: 150 mg/dL — ABNORMAL HIGH (ref 70–99)

## 2024-04-26 LAB — ABO/RH: ABO/RH(D): B POS

## 2024-04-26 MED ORDER — DIPHENHYDRAMINE HCL 50 MG/ML IJ SOLN
INTRAMUSCULAR | Status: AC
  Filled 2024-04-26: qty 1

## 2024-04-26 MED ORDER — CHLORHEXIDINE GLUCONATE CLOTH 2 % EX PADS: 6.0000 | MEDICATED_PAD | Freq: Once | CUTANEOUS | Status: DC

## 2024-04-26 MED ORDER — MEPERIDINE HCL 25 MG/ML IJ SOLN: 6.2500 mg | INTRAMUSCULAR | Status: DC | PRN

## 2024-04-26 MED ORDER — PHENOL 1.4 % MT LIQD: 1.0000 | OROMUCOSAL | Status: DC | PRN

## 2024-04-26 MED ORDER — HYDROMORPHONE HCL 2 MG PO TABS
2.0000 mg | ORAL_TABLET | ORAL | Status: DC | PRN
  Administered 2024-04-26: 2 mg via ORAL
  Administered 2024-04-26: 4 mg via ORAL
  Administered 2024-04-27 (×2): 2 mg via ORAL
  Filled 2024-04-26: qty 1
  Filled 2024-04-26: qty 2
  Filled 2024-04-26: qty 1

## 2024-04-26 MED ORDER — SODIUM CHLORIDE 0.9% FLUSH: 3.0000 mL | Freq: Two times a day (BID) | INTRAVENOUS | Status: DC

## 2024-04-26 MED ORDER — HYDROMORPHONE HCL 1 MG/ML IJ SOLN: 0.2500 mg | INTRAMUSCULAR | Status: DC | PRN

## 2024-04-26 MED ORDER — BUPIVACAINE-EPINEPHRINE (PF) 0.5% -1:200000 IJ SOLN
INTRAMUSCULAR | Status: AC
  Filled 2024-04-26: qty 30

## 2024-04-26 MED ORDER — ACETAMINOPHEN 500 MG PO TABS
1000.0000 mg | ORAL_TABLET | Freq: Once | ORAL | Status: AC
  Administered 2024-04-26: 1000 mg via ORAL
  Filled 2024-04-26: qty 2

## 2024-04-26 MED ORDER — THROMBIN 5000 UNITS EX KIT
PACK | CUTANEOUS | Status: AC
  Filled 2024-04-26: qty 1

## 2024-04-26 MED ORDER — ALBUTEROL SULFATE HFA 108 (90 BASE) MCG/ACT IN AERS
INHALATION_SPRAY | RESPIRATORY_TRACT | Status: AC
  Filled 2024-04-26: qty 6.7

## 2024-04-26 MED ORDER — ROCURONIUM BROMIDE 10 MG/ML (PF) SYRINGE
PREFILLED_SYRINGE | INTRAVENOUS | Status: AC
  Filled 2024-04-26: qty 30

## 2024-04-26 MED ORDER — LIDOCAINE 2% (20 MG/ML) 5 ML SYRINGE
INTRAMUSCULAR | Status: DC | PRN
  Administered 2024-04-26: 40 mg via INTRAVENOUS

## 2024-04-26 MED ORDER — DOCUSATE SODIUM 100 MG PO CAPS
100.0000 mg | ORAL_CAPSULE | Freq: Two times a day (BID) | ORAL | Status: DC
  Administered 2024-04-26 – 2024-04-27 (×2): 100 mg via ORAL
  Filled 2024-04-26 (×2): qty 1

## 2024-04-26 MED ORDER — ROCURONIUM BROMIDE 10 MG/ML (PF) SYRINGE
PREFILLED_SYRINGE | INTRAVENOUS | Status: DC | PRN
  Administered 2024-04-26: 20 mg via INTRAVENOUS
  Administered 2024-04-26: 30 mg via INTRAVENOUS
  Administered 2024-04-26: 20 mg via INTRAVENOUS
  Administered 2024-04-26: 70 mg via INTRAVENOUS

## 2024-04-26 MED ORDER — MIDAZOLAM HCL (PF) 2 MG/2ML IJ SOLN
INTRAMUSCULAR | Status: DC | PRN
  Administered 2024-04-26: 2 mg via INTRAVENOUS

## 2024-04-26 MED ORDER — ACETAMINOPHEN 325 MG PO TABS: 650.0000 mg | ORAL_TABLET | ORAL | Status: DC | PRN

## 2024-04-26 MED ORDER — FENTANYL CITRATE (PF) 100 MCG/2ML IJ SOLN
INTRAMUSCULAR | Status: AC
  Filled 2024-04-26: qty 2

## 2024-04-26 MED ORDER — BACITRACIN ZINC 500 UNIT/GM EX OINT
TOPICAL_OINTMENT | CUTANEOUS | Status: AC
  Filled 2024-04-26: qty 28.35

## 2024-04-26 MED ORDER — LACTATED RINGERS IV SOLN: INTRAVENOUS | Status: DC

## 2024-04-26 MED ORDER — BUPIVACAINE-EPINEPHRINE (PF) 0.5% -1:200000 IJ SOLN
INTRAMUSCULAR | Status: DC | PRN
  Administered 2024-04-26: 10 mL via PERINEURAL

## 2024-04-26 MED ORDER — SODIUM CHLORIDE 0.9 % IV SOLN: 250.0000 mL | INTRAVENOUS | Status: DC

## 2024-04-26 MED ORDER — MENTHOL 3 MG MT LOZG: 1.0000 | LOZENGE | OROMUCOSAL | Status: DC | PRN

## 2024-04-26 MED ORDER — ONDANSETRON HCL 4 MG/2ML IJ SOLN
INTRAMUSCULAR | Status: DC | PRN
  Administered 2024-04-26: 4 mg via INTRAVENOUS

## 2024-04-26 MED ORDER — MIDAZOLAM HCL 2 MG/2ML IJ SOLN
INTRAMUSCULAR | Status: AC
  Filled 2024-04-26: qty 2

## 2024-04-26 MED ORDER — CEFAZOLIN SODIUM-DEXTROSE 2-4 GM/100ML-% IV SOLN: 2.0000 g | Freq: Three times a day (TID) | INTRAVENOUS | Status: DC

## 2024-04-26 MED ORDER — NEBIVOLOL HCL 5 MG PO TABS
5.0000 mg | ORAL_TABLET | Freq: Every morning | ORAL | Status: DC
  Administered 2024-04-27: 5 mg via ORAL
  Filled 2024-04-26: qty 1

## 2024-04-26 MED ORDER — 0.9 % SODIUM CHLORIDE (POUR BTL) OPTIME
TOPICAL | Status: DC | PRN
  Administered 2024-04-26: 1000 mL

## 2024-04-26 MED ORDER — HYDROMORPHONE HCL 1 MG/ML IJ SOLN
INTRAMUSCULAR | Status: AC
  Filled 2024-04-26: qty 0.5

## 2024-04-26 MED ORDER — ONDANSETRON HCL 4 MG PO TABS: 4.0000 mg | ORAL_TABLET | Freq: Four times a day (QID) | ORAL | Status: DC | PRN

## 2024-04-26 MED ORDER — ACETAMINOPHEN 650 MG RE SUPP: 650.0000 mg | RECTAL | Status: DC | PRN

## 2024-04-26 MED ORDER — ALBUTEROL SULFATE HFA 108 (90 BASE) MCG/ACT IN AERS
INHALATION_SPRAY | RESPIRATORY_TRACT | Status: DC | PRN
  Administered 2024-04-26: 2 via RESPIRATORY_TRACT

## 2024-04-26 MED ORDER — ATORVASTATIN CALCIUM 10 MG PO TABS
10.0000 mg | ORAL_TABLET | Freq: Every day | ORAL | Status: DC
  Administered 2024-04-26 – 2024-04-27 (×2): 10 mg via ORAL
  Filled 2024-04-26 (×2): qty 1

## 2024-04-26 MED ORDER — SCOPOLAMINE 1 MG/3DAYS TD PT72
1.0000 | MEDICATED_PATCH | TRANSDERMAL | Status: DC
  Administered 2024-04-26: 1 mg via TRANSDERMAL
  Filled 2024-04-26: qty 1

## 2024-04-26 MED ORDER — CEFAZOLIN SODIUM 1 G IJ SOLR
INTRAMUSCULAR | Status: AC
  Filled 2024-04-26: qty 30

## 2024-04-26 MED ORDER — MONTELUKAST SODIUM 10 MG PO TABS
10.0000 mg | ORAL_TABLET | Freq: Every day | ORAL | Status: DC
  Administered 2024-04-26: 10 mg via ORAL
  Filled 2024-04-26: qty 1

## 2024-04-26 MED ORDER — VALACYCLOVIR HCL 500 MG PO TABS
500.0000 mg | ORAL_TABLET | Freq: Every day | ORAL | Status: DC
  Administered 2024-04-27: 500 mg via ORAL
  Filled 2024-04-26: qty 1

## 2024-04-26 MED ORDER — HYDROMORPHONE HCL 2 MG PO TABS
ORAL_TABLET | ORAL | Status: AC
  Filled 2024-04-26: qty 1

## 2024-04-26 MED ORDER — INSULIN ASPART 100 UNIT/ML IJ SOLN: 0.0000 [IU] | INTRAMUSCULAR | Status: DC | PRN

## 2024-04-26 MED ORDER — HYDROCHLOROTHIAZIDE 25 MG PO TABS
25.0000 mg | ORAL_TABLET | Freq: Two times a day (BID) | ORAL | Status: DC
  Administered 2024-04-26 – 2024-04-27 (×2): 25 mg via ORAL
  Filled 2024-04-26 (×2): qty 1

## 2024-04-26 MED ORDER — LEFLUNOMIDE 10 MG PO TABS
20.0000 mg | ORAL_TABLET | Freq: Every morning | ORAL | Status: DC
  Administered 2024-04-27: 20 mg via ORAL
  Filled 2024-04-26: qty 2

## 2024-04-26 MED ORDER — ORAL CARE MOUTH RINSE: 15.0000 mL | Freq: Once | OROMUCOSAL | Status: AC

## 2024-04-26 MED ORDER — VASHE WOUND IRRIGATION OPTIME
TOPICAL | Status: DC | PRN
  Administered 2024-04-26: 34 [oz_av] via TOPICAL

## 2024-04-26 MED ORDER — FENTANYL CITRATE (PF) 250 MCG/5ML IJ SOLN
INTRAMUSCULAR | Status: DC | PRN
  Administered 2024-04-26: 100 ug via INTRAVENOUS

## 2024-04-26 MED ORDER — ONDANSETRON HCL 4 MG/2ML IJ SOLN: 4.0000 mg | Freq: Four times a day (QID) | INTRAMUSCULAR | Status: DC | PRN

## 2024-04-26 MED ORDER — MIDAZOLAM HCL (PF) 2 MG/2ML IJ SOLN: 0.5000 mg | Freq: Once | INTRAMUSCULAR | Status: DC | PRN

## 2024-04-26 MED ORDER — THROMBIN 5000 UNITS EX SOLR
OROMUCOSAL | Status: DC | PRN
  Administered 2024-04-26: 5 mL via TOPICAL

## 2024-04-26 MED ORDER — BUPIVACAINE LIPOSOME 1.3 % IJ SUSP
INTRAMUSCULAR | Status: AC
  Filled 2024-04-26: qty 20

## 2024-04-26 MED ORDER — ZOLPIDEM TARTRATE 5 MG PO TABS: 5.0000 mg | ORAL_TABLET | Freq: Every evening | ORAL | Status: DC | PRN

## 2024-04-26 MED ORDER — LORATADINE 10 MG PO TABS
10.0000 mg | ORAL_TABLET | Freq: Every day | ORAL | Status: DC
  Administered 2024-04-26: 10 mg via ORAL
  Filled 2024-04-26: qty 1

## 2024-04-26 MED ORDER — BISACODYL 10 MG RE SUPP: 10.0000 mg | Freq: Every day | RECTAL | Status: DC | PRN

## 2024-04-26 MED ORDER — SUGAMMADEX SODIUM 200 MG/2ML IV SOLN
INTRAVENOUS | Status: DC | PRN
  Administered 2024-04-26: 200 mg via INTRAVENOUS

## 2024-04-26 MED ORDER — BUPIVACAINE LIPOSOME 1.3 % IJ SUSP
INTRAMUSCULAR | Status: DC | PRN
  Administered 2024-04-26: 20 mL

## 2024-04-26 MED ORDER — EPHEDRINE SULFATE-NACL 50-0.9 MG/10ML-% IV SOSY
PREFILLED_SYRINGE | INTRAVENOUS | Status: DC | PRN
  Administered 2024-04-26: 5 mg via INTRAVENOUS
  Administered 2024-04-26: 10 mg via INTRAVENOUS
  Administered 2024-04-26: 5 mg via INTRAVENOUS

## 2024-04-26 MED ORDER — DEXAMETHASONE SOD PHOSPHATE PF 10 MG/ML IJ SOLN
INTRAMUSCULAR | Status: DC | PRN
  Administered 2024-04-26: 10 mg via INTRAVENOUS

## 2024-04-26 MED ORDER — PHENYLEPHRINE HCL-NACL 20-0.9 MG/250ML-% IV SOLN
INTRAVENOUS | Status: DC | PRN
  Administered 2024-04-26: 25 ug/min via INTRAVENOUS

## 2024-04-26 MED ORDER — SODIUM CHLORIDE 0.9% FLUSH: 3.0000 mL | INTRAVENOUS | Status: DC | PRN

## 2024-04-26 MED ORDER — CYCLOBENZAPRINE HCL 10 MG PO TABS
10.0000 mg | ORAL_TABLET | Freq: Three times a day (TID) | ORAL | Status: DC | PRN
  Administered 2024-04-26 – 2024-04-27 (×2): 10 mg via ORAL
  Filled 2024-04-26 (×2): qty 1

## 2024-04-26 MED ORDER — VANCOMYCIN HCL 1250 MG/250ML IV SOLN
1250.0000 mg | Freq: Once | INTRAVENOUS | Status: AC
  Administered 2024-04-26: 1250 mg via INTRAVENOUS
  Filled 2024-04-26: qty 250

## 2024-04-26 MED ORDER — PHENYLEPHRINE 80 MCG/ML (10ML) SYRINGE FOR IV PUSH (FOR BLOOD PRESSURE SUPPORT)
PREFILLED_SYRINGE | INTRAVENOUS | Status: AC
  Filled 2024-04-26: qty 30

## 2024-04-26 MED ORDER — PHENYLEPHRINE 80 MCG/ML (10ML) SYRINGE FOR IV PUSH (FOR BLOOD PRESSURE SUPPORT)
PREFILLED_SYRINGE | INTRAVENOUS | Status: DC | PRN
  Administered 2024-04-26: 160 ug via INTRAVENOUS
  Administered 2024-04-26: 80 ug via INTRAVENOUS
  Administered 2024-04-26: 160 ug via INTRAVENOUS

## 2024-04-26 MED ORDER — EPHEDRINE 5 MG/ML INJ
INTRAVENOUS | Status: AC
  Filled 2024-04-26: qty 10

## 2024-04-26 MED ORDER — MORPHINE SULFATE (PF) 2 MG/ML IV SOLN
2.0000 mg | INTRAVENOUS | Status: DC | PRN
  Administered 2024-04-26: 4 mg via INTRAVENOUS
  Filled 2024-04-26: qty 2

## 2024-04-26 MED ORDER — PROPOFOL 10 MG/ML IV BOLUS
INTRAVENOUS | Status: DC | PRN
  Administered 2024-04-26: 150 mg via INTRAVENOUS

## 2024-04-26 MED ORDER — DEXMEDETOMIDINE HCL IN NACL 80 MCG/20ML IV SOLN
INTRAVENOUS | Status: DC | PRN
  Administered 2024-04-26: 8 ug via INTRAVENOUS

## 2024-04-26 MED ORDER — ALBUTEROL SULFATE HFA 108 (90 BASE) MCG/ACT IN AERS: 2.0000 | INHALATION_SPRAY | Freq: Four times a day (QID) | RESPIRATORY_TRACT | Status: DC | PRN

## 2024-04-26 MED ORDER — ACETAMINOPHEN 500 MG PO TABS
1000.0000 mg | ORAL_TABLET | Freq: Four times a day (QID) | ORAL | Status: DC
  Administered 2024-04-26 – 2024-04-27 (×3): 1000 mg via ORAL
  Filled 2024-04-26 (×3): qty 2

## 2024-04-26 MED ORDER — PANTOPRAZOLE SODIUM 40 MG PO TBEC
40.0000 mg | DELAYED_RELEASE_TABLET | Freq: Every day | ORAL | Status: DC
  Filled 2024-04-26: qty 1

## 2024-04-26 MED ORDER — CHLORHEXIDINE GLUCONATE 0.12 % MT SOLN
15.0000 mL | Freq: Once | OROMUCOSAL | Status: AC
  Administered 2024-04-26: 15 mL via OROMUCOSAL
  Filled 2024-04-26: qty 15

## 2024-04-26 MED ORDER — LIDOCAINE 2% (20 MG/ML) 5 ML SYRINGE
INTRAMUSCULAR | Status: AC
  Filled 2024-04-26: qty 5

## 2024-04-26 MED ORDER — HYDROMORPHONE HCL 1 MG/ML IJ SOLN
INTRAMUSCULAR | Status: DC | PRN
  Administered 2024-04-26: .5 mg via INTRAVENOUS

## 2024-04-26 MED ORDER — VANCOMYCIN HCL 1500 MG/300ML IV SOLN
1500.0000 mg | INTRAVENOUS | Status: AC
  Administered 2024-04-26: 1500 mg via INTRAVENOUS
  Filled 2024-04-26 (×2): qty 300

## 2024-04-26 MED ORDER — PROPOFOL 500 MG/50ML IV EMUL
INTRAVENOUS | Status: DC | PRN
  Administered 2024-04-26: 50 ug/kg/min via INTRAVENOUS

## 2024-04-26 MED ORDER — FLUTICASONE PROPIONATE 50 MCG/ACT NA SUSP
1.0000 | Freq: Every day | NASAL | Status: DC
  Filled 2024-04-26: qty 16

## 2024-04-26 NOTE — Transfer of Care (Signed)
 Immediate Anesthesia Transfer of Care Note  Patient: [name]  Procedure(s) Performed: POSTERIOR LUMBAR FUSION LUMBAR FOUR-FIVE  Patient Location: PACU  Anesthesia Type:General  Level of Consciousness: awake, drowsy, and responds to stimulation  Airway & Oxygen Therapy: Patient Spontanous Breathing and Patient connected to face mask oxygen  Post-op Assessment: Report given to RN and Post -op Vital signs reviewed and stable  Post vital signs: Reviewed and stable  Last Vitals:  Vitals Value Taken Time  BP 98/60 04/26/24 17:45  Temp 36.1 C 04/26/24 17:45  Pulse 82 04/26/24 17:48  Resp 19 04/26/24 17:48  SpO2 98 % 04/26/24 17:48  Vitals shown include unfiled device data.  Last Pain:  Vitals:   04/26/24 1745  TempSrc:   PainSc: Asleep      Patients Stated Pain Goal: 3 (04/26/24 1056)  Complications: No notable events documented.

## 2024-04-26 NOTE — Anesthesia Postprocedure Evaluation (Signed)
"   Anesthesia Post Note  Patient: [name]  Procedure(s) Performed: POSTERIOR LUMBAR FUSION LUMBAR FOUR-FIVE     Patient location during evaluation: PACU Anesthesia Type: General Level of consciousness: awake and alert, oriented and patient cooperative Pain management: pain level controlled (pain improving) Vital Signs Assessment: post-procedure vital signs reviewed and stable Respiratory status: spontaneous breathing, nonlabored ventilation and respiratory function stable Cardiovascular status: blood pressure returned to baseline and stable Postop Assessment: no apparent nausea or vomiting and able to ambulate Anesthetic complications: no   No notable events documented.  Last Vitals:  Vitals:   04/26/24 1900 04/26/24 1915  BP: 126/72 127/82  Pulse: 82 77  Resp: 17 14  Temp:  36.4 C  SpO2: 97% 93%    Last Pain:  Vitals:   04/26/24 1920  TempSrc:   PainSc: 5     LLE Motor Response: Purposeful movement (04/26/24 1915) LLE Sensation: Full sensation (04/26/24 1915) RLE Motor Response: Purposeful movement (04/26/24 1915) RLE Sensation: Full sensation (04/26/24 1915)      Daejah Klebba,E. Georgia Delsignore      "

## 2024-04-26 NOTE — Op Note (Signed)
 Brief history: The patient is a 69 year old black female whose had a previous lumbar discectomy years ago.  She has developed recurrent back and bilateral leg pain consistent with neurogenic claudication.  She has failed medical management and was worked up with a lumbar MRI and lumbar x-rays which demonstrated a scoliosis and a lumbar spondylolisthesis.  I discussed the various treatment options with her.  She has decided to proceed with surgery.  Preoperative diagnosis: Lumbar scoliosis, lumbar spondylolisthesis, lumbar facet arthropathy, spinal stenosis compressing both the L4 and the L5 nerve roots; lumbago; lumbar radiculopathy; neurogenic claudication  Postoperative diagnosis: The same  Procedure: Bilateral L4-5 laminotomy/foraminotomies/medial facetectomy to decompress the bilateral L4 and L5 nerve roots(the work required to do this was in addition to the work required to do the posterior lumbar interbody fusion because of the patient's spinal stenosis, facet arthropathy. Etc. requiring a wide decompression of the nerve roots.); L4-5 transforaminal lumbar interbody fusion with local morselized autograft bone and Medtronic DBM; insertion of interbody prosthesis at L4-5 (globus peek expandable interbody prosthesis); posterior nonsegmental instrumentation from L4 to L5 with globus titanium pedicle screws and rods; posterior lateral arthrodesis at L4-5 with local morselized autograft bone and Medtronic DBM.  Surgeon: Dr. Chyrl Budge  Asst.: Duwaine Beck, NP  Anesthesia: Gen. endotracheal  Estimated blood loss: 250 cc  Drains: Medium Hemovac drain in the epidural space  Complications: None  Description of procedure: The patient was brought to the operating room by the anesthesia team. General endotracheal anesthesia was induced. The patient was turned to the prone position on the Wilson frame. The patient's lumbosacral region was then prepared with Betadine scrub and Betadine solution.  Sterile drapes were applied.  I then injected the area to be incised with Marcaine  with epinephrine  solution. I then used the scalpel to make a linear midline incision over the L4-5 interspace. I then used electrocautery to perform a bilateral subperiosteal dissection exposing the spinous process and lamina of L4-5. We then obtained intraoperative radiograph to confirm our location. We then inserted the Verstrac retractor to provide exposure.  I began the decompression by using the high speed drill to perform laminotomies at L4-5 bilaterally. We then used the Kerrison punches to widen the laminotomy and removed the ligamentum flavum at L4-5 bilaterally. We used the Kerrison punches to remove the  facets at L4-5 bilaterally. We performed wide foraminotomies about the bilateral L4 and L5 nerve roots completing the decompression.  We now turned our attention to the posterior lumbar interbody fusion. I used a scalpel to incise the intervertebral disc at L4-5 bilaterally. I then performed a partial intervertebral discectomy at L4-5 bilaterally using the pituitary forceps. We prepared the vertebral endplates at L4-5 bilaterally for the fusion by removing the soft tissues with the curettes. We then used the trial spacers to pick the appropriate sized interbody prosthesis. We prefilled his prosthesis with a combination of local morselized autograft bone that we obtained during the decompression as well as Zimmer DBM. We inserted the prefilled prosthesis into the interspace at L4-5, we then turned and expanded the prosthesis. There was a good snug fit of the prosthesis in the interspace. We then filled and the remainder of the intervertebral disc space with local morselized autograft bone and Zimmer DBM. This completed the posterior lumbar interbody arthrodesis.  During the decompression and insertion of the prosthesis the assistant protected the thecal sac and nerve roots with the D'Errico retractor.  We now turned  attention to the instrumentation. Under fluoroscopic guidance we  cannulated the bilateral L4 and L5 pedicles with the bone probe. We then removed the bone probe. We then tapped the pedicle with a 6.5 millimeter tap. We then removed the tap. We probed inside the tapped pedicle with a ball probe to rule out cortical breaches. We then inserted a 7.5 x 50 and 55 millimeter pedicle screw into the L4 and L5 pedicles bilaterally under fluoroscopic guidance. We then palpated along the medial aspect of the pedicles to rule out cortical breaches. There were none. The nerve roots were not injured. We then connected the unilateral pedicle screws with a lordotic rod. We compressed the construct and secured the rod in place with the caps. We then tightened the caps appropriately. This completed the instrumentation from L4-5.  We now turned our attention to the posterior lateral arthrodesis at L4-5. We used the high-speed drill to decorticate the remainder of the facets, pars, transverse process at L4-5. We then applied a combination of local morselized autograft bone and Zimmer DBM over these decorticated posterior lateral structures. This completed the posterior lateral arthrodesis.  We then obtained hemostasis using bipolar electrocautery. We irrigated the wound out with vashe solution. We inspected the thecal sac and nerve roots and noted they were well decompressed. We then removed the retractor.  We injected Exparel  .  We placed a medium Hemovac drain in the epidural space and tunneled it out through a separate stab wound.  We reapproximated patient's thoracolumbar fascia with interrupted #1 Vicryl suture. We reapproximated patient's subcutaneous tissue with interrupted 2-0 Vicryl suture. The reapproximated patient's skin with Steri-Strips and benzoin. The wound was then coated with bacitracin  ointment. A sterile dressing was applied. The drapes were removed. The patient was subsequently returned to the supine position  where they were extubated by the anesthesia team. He was then transported to the post anesthesia care unit in stable condition. All sponge instrument and needle counts were reportedly correct at the end of this case.

## 2024-04-26 NOTE — Progress Notes (Signed)
 Orthopedic Tech Progress Note Patient Details:  [name] [birth year] [id number]  Ortho Devices Type of Ortho Device: Lumbar corsett Ortho Device/Splint Location: Back Ortho Device/Splint Interventions: Ordered    Pt was in PACU left LSO Brace with nurse contact ortho once pt is ready to be fitted for brace.  Thersia FALCON [name] 04/26/2024, 6:35 PM

## 2024-04-26 NOTE — Anesthesia Procedure Notes (Signed)
 Procedure Name: Intubation Date/Time: 04/26/2024 1:22 PM  Performed by: Mollie Olivia SAUNDERS, CRNAPre-anesthesia Checklist: Patient identified, Emergency Drugs available, Suction available and Patient being monitored Patient Re-evaluated:Patient Re-evaluated prior to induction Oxygen Delivery Method: Circle system utilized Preoxygenation: Pre-oxygenation with 100% oxygen Induction Type: IV induction Ventilation: Mask ventilation without difficulty Laryngoscope Size: Glidescope and 3 Grade View: Grade II Tube type: Oral Tube size: 7.0 mm Number of attempts: 1 Airway Equipment and Method: Rigid stylet and Video-laryngoscopy Placement Confirmation: ETT inserted through vocal cords under direct vision, positive ETCO2 and breath sounds checked- equal and bilateral Secured at: 22 cm Tube secured with: Tape Dental Injury: Teeth and Oropharynx as per pre-operative assessment  Difficulty Due To: Difficulty was anticipated, Difficult Airway- due to immobile epiglottis and Difficult Airway- due to reduced neck mobility

## 2024-04-26 NOTE — H&P (Signed)
 Subjective: The patient is a 69 year old black female who is complaining of back and bilateral buttock leg pain consistent with neurogenic claudication.  She has failed medical management.  She was worked up with a lumbar MRI and lumbar x-rays which demonstrated a lumbar spine listhesis with spinal stenosis.  I discussed the various treatment options with her.  She has decided to proceed with surgery.  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   HTN (hypertension)    Joint pain    Lactose intolerance    Multiple food allergies    OA (osteoarthritis)    Reflux    Sjogren's syndrome    SOB (shortness of breath)    Swallowing difficulty    Thyroid  condition    Vitamin D  deficiency     Past Surgical History:  Procedure Laterality Date   ANKLE SURGERY Right 2024   APPENDECTOMY     BACK SURGERY  03/12/2010   L5   BREAST LUMPECTOMY Left    BREAST SURGERY     BIOPSY   HERNIA REPAIR     groin bilateral   LEFT HEART CATH AND CORONARY ANGIOGRAPHY  2019   [facility]: Normal coronaries   LEFT HEART CATH AND CORONARY ANGIOGRAPHY  2020   [place], [place].  Normal   NECK SURGERY     PELVIC LAPAROSCOPY  [id number]   PILONIDAL CYST EXCISION     SALPINGOOPHORECTOMY  10/11/2002   SCOPE LSO-LEFT SEROUS CYSTADENOMA   SALPINGOOPHORECTOMY  04/12/2005   RSO/TOA   SHOULDER SURGERY Right 01/2018   TONSILLECTOMY AND ADENOIDECTOMY     TUBAL LIGATION     VAGINAL HYSTERECTOMY  01/10/2005   [facility], POSTERIOR REPAIR    Allergies[1]  Social History   Tobacco Use   Smoking status: Never   Smokeless tobacco: Never   Substance Use Topics   Alcohol use: Not Currently    Comment: Rare    Family History  Problem Relation Age of Onset   Hypertension Mother    Heart disease Mother    Arthritis Mother    Hyperlipidemia Mother    Stroke Mother    Heart disease Father    Hypertension Father    Cancer Father        Prostate and lung   Alcohol abuse Father    Anxiety disorder Father    Cancer Paternal Grandmother        COLON CA   Breast cancer Maternal Aunt        LATE 50'S   Ovarian cancer Maternal Aunt    Prior to Admission medications  Medication Sig Start Date End Date Taking? Authorizing Provider  acetaminophen  (TYLENOL ) 650 MG CR tablet Take 650-1,300 mg by mouth every 8 (eight) hours as needed for pain.   Yes [provider]  albuterol  (VENTOLIN  HFA) 108 (90 Base) MCG/ACT inhaler Inhale 2 puffs into the lungs every 6 (six) hours as needed for wheezing or shortness of breath. 06/13/23  Yes Acevedo, Angela, PA  Artificial  Saliva (SALESE/XYLITOL) LOZG Use as directed 1 lozenge in the mouth or throat as needed (dry mouth). ICE BREAKERS DUO for dry mouth   Yes [provider]  Artificial Saliva (THERABREATH DRY MOUTH) LOZG Use as directed 1 lozenge in the mouth or throat as needed (dry mouth).   Yes [provider]  ascorbic acid (VITAMIN C) 500 MG tablet Take 500 mg by mouth daily as needed (immune support). Patient not taking: Reported on 04/23/2024   Yes [provider]  atorvastatin  (LIPITOR) 10 MG tablet 1 tab PO Q Mon/Wed/Frid 02/03/24  Yes Vicci Barnie NOVAK, MD  b complex vitamins capsule Take 1 capsule by mouth in the morning. Patient not taking: Reported on 04/23/2024   Yes [provider]  Blood Glucose Monitoring Suppl (ACCU-CHEK GUIDE) w/Device KIT Use to check blood sugar 3 times daily. 02/28/24  Yes Vicci Barnie NOVAK, MD  cetirizine (ZYRTEC) 10 MG tablet Take 10 mg by mouth at bedtime.   Yes [provider]  dexlansoprazole   (DEXILANT ) 60 MG capsule Take 1 capsule (60 mg total) by mouth daily. 02/03/24  Yes Vicci Barnie NOVAK, MD  diclofenac Sodium (VOLTAREN) 1 % GEL Apply 2 g topically 4 (four) times daily as needed (arthritis pain.).   Yes [provider]  EPINEPHrine  0.3 mg/0.3 mL IJ SOAJ injection Inject 0.3 mg into the muscle as needed for anaphylaxis. 09/01/22  Yes Vicci Barnie NOVAK, MD  furosemide  (LASIX ) 20 MG tablet Take 1 tablet (20 mg total) by mouth daily as needed. 10/04/23  Yes Vicci Barnie NOVAK, MD  glucose 4 GM chewable tablet Chew 4 tablets by mouth daily as needed for low blood sugar.   Yes [provider]  glucose blood (ACCU-CHEK GUIDE TEST) test strip Use to check blood sugar 3 times daily. 02/28/24  Yes Vicci Barnie NOVAK, MD  hydrochlorothiazide  (HYDRODIURIL ) 25 MG tablet Take 1 tablet (25 mg total) by mouth 2 (two) times daily. 02/03/24  Yes Vicci Barnie NOVAK, MD  Hydrocortisone (CORTIZONE-10 EX) Apply 1 Application topically as needed (eczema).   Yes [provider]  ipratropium-albuterol  (DUONEB) 0.5-2.5 (3) MG/3ML SOLN USE 1 AMPULE IN NEBULIZER EVERY 6 HOURS AS NEEDED 04/20/24  Yes Vicci Barnie NOVAK, MD  leflunomide  (ARAVA ) 20 MG tablet Take 20 mg by mouth in the morning. Patient not taking: Reported on 04/23/2024 09/22/23  Yes [provider]  meclizine  (ANTIVERT ) 25 MG tablet Take 1 tablet (25 mg total) by mouth 2 (two) times daily as needed for dizziness. 02/03/24  Yes Vicci Barnie NOVAK, MD  meloxicam  (MOBIC ) 15 MG tablet Take 1 tablet (15 mg total) by mouth daily. 10/20/21  Yes Vicci Barnie NOVAK, MD  montelukast  (SINGULAIR ) 10 MG tablet Take 1 tablet (10 mg total) by mouth at bedtime. 05/02/23  Yes Vicci Barnie NOVAK, MD  nebivolol  (BYSTOLIC ) 5 MG tablet Take 1 tablet (5 mg total) by mouth every morning. 05/02/23  Yes Vicci Barnie NOVAK, MD  pilocarpine (SALAGEN) 5 MG tablet Take 5 mg by mouth 3 (three) times daily as needed (dry mouth).   Yes [provider]  polyethylene glycol (MIRALAX / GLYCOLAX) 17 g packet Take 17 g by mouth daily as needed (severe constipation.).   Yes [provider]  Povidone, PF, (IVIZIA DRY EYES) 0.5 % SOLN Place 1 drop into both eyes in the morning and at bedtime.   Yes [provider]  tiZANidine  (ZANAFLEX ) 2 MG tablet Take 1 tablet (2 mg total) by mouth every 8 (eight)  hours as needed for muscle spasms. 03/28/24  Yes Vicci Barnie NOVAK, MD  traMADol  (ULTRAM ) 50 MG tablet Take 1 tablet (50 mg total) by mouth every 8 (eight) hours as needed. 10/05/23  Yes Vicci Barnie NOVAK, MD  triamcinolone (NASACORT) 55 MCG/ACT nasal inhaler Place 2 sprays into both nostrils in the morning.   Yes [provider]  valACYclovir  (VALTREX ) 500 MG tablet Take 1 tablet (500 mg total) by mouth daily. 05/02/23  Yes Vicci Barnie NOVAK, MD  vitamin E 180 MG (400 UNITS) capsule Take 400 Units by mouth in the morning and at bedtime. Patient not taking: Reported on 04/23/2024   Yes [provider]  Accu-Chek Softclix Lancets lancets Use to check blood sugar 3 times daily. 02/28/24   Vicci Barnie NOVAK, MD  benzonatate  (TESSALON ) 200 MG capsule Take 1 capsule by mouth three times daily as needed for cough 05/11/23   Vicci Barnie NOVAK, MD  CINNAMON PO Take 2 tablets by mouth daily. Patient not taking: Reported on 04/23/2024    [provider]  fluconazole  (DIFLUCAN ) 150 MG tablet Take 1 tablet (150 mg total) by mouth daily. 02/03/24   Vicci Barnie NOVAK, MD  triamcinolone cream (KENALOG) 0.1 % Apply 1 Application topically 2 (two) times daily as needed (eczema). 10/27/23   [provider]  Vitamin D , Ergocalciferol , (DRISDOL ) 1.25 MG (50000 UNIT) CAPS capsule Take 1 capsule by mouth once a week 04/04/24   Vicci Barnie NOVAK, MD     Review of Systems  Positive ROS: As above  All other systems have been reviewed and were otherwise negative with the exception of those mentioned in the HPI  and as above.  Objective: Vital signs in last 24 hours: Temp:  [98.6 F (37 C)] 98.6 F (37 C) (01/15 1018) Pulse Rate:  [75] 75 (01/15 1018) Resp:  [20] 20 (01/15 1018) BP: (146)/(84) 146/84 (01/15 1018) SpO2:  [100 %] 100 % (01/15 1018) Weight:  [101.6 kg] 101.6 kg (01/15 1018) Estimated body mass index is 36.15 kg/m as calculated from the following:   Height as of this encounter: 5' 6 (1.676 m).   Weight as of this encounter: 101.6 kg.   General Appearance: Alert Head: Normocephalic, without obvious abnormality, atraumatic Eyes: PERRL, conjunctiva/corneas clear, EOM's intact,    Ears: Normal  Throat: Normal  Neck: Supple, Back: unremarkable Lungs: Clear to auscultation bilaterally, respirations unlabored Heart: Regular rate and rhythm, no murmur, rub or gallop Abdomen: Soft, non-tender Extremities: Extremities normal, atraumatic, no cyanosis or edema Skin: unremarkable  NEUROLOGIC:   Mental status: alert and oriented,Motor Exam - grossly normal Sensory Exam - grossly normal Reflexes:  Coordination - grossly normal Gait - grossly normal Balance - grossly normal Cranial Nerves: I: smell Not tested  II: visual acuity  OS: Normal  OD: Normal   II: visual fields Full to confrontation  II: pupils Equal, round, reactive to light  III,VII: ptosis None  III,IV,VI: extraocular muscles  Full ROM  V: mastication Normal  V: facial light touch sensation  Normal  V,VII: corneal reflex  Present  VII: facial muscle function - upper  Normal  VII: facial muscle function - lower Normal  VIII: hearing Not tested  IX: soft palate elevation  Normal  IX,X: gag reflex Present  XI: trapezius strength  5/5  XI: sternocleidomastoid strength 5/5  XI: neck flexion strength  5/5  XII: tongue strength  Normal    Data Review Lab Results  Component Value Date   WBC 5.5  04/20/2024   HGB 13.4 04/20/2024   HCT 41.8 04/20/2024   MCV 84.3 04/20/2024   PLT 194 04/20/2024   Lab  Results  Component Value Date   NA 142 04/20/2024   K 3.8 04/20/2024   CL 104 04/20/2024   CO2 28 04/20/2024   BUN 19 04/20/2024   CREATININE 0.76 04/20/2024   GLUCOSE 98 04/20/2024   Lab Results  Component Value Date   INR 1.03 03/18/2009    Assessment/Plan: Lumbar spinal stenosis, lumbar facet arthropathy, lumbar spinal stenosis, neurogenic claudication, lumbago, lumbar radiculopathy: I have discussed the situation with the patient.  I reviewed her imaging studies with her and pointed out the abnormalities.  We have discussed the various treatment options including surgery.  I have described the surgical treatment option of an L4-5 decompression, instrumentation and fusion.  I have shown her surgical models.  I have given her a surgical pamphlet.  We have discussed the risk, benefits, alternatives, expected postop course, and likelihood of achieving her goals with surgery.  I have answered all the patient's questions.  She has decided to proceed with surgery.   Reyes JONETTA Budge 04/26/2024 12:06 PM         [1]  Allergies Allergen Reactions   Augmentin [Amoxicillin-Pot Clavulanate] Shortness Of Breath and Rash   Bee Venom Shortness Of Breath and Swelling   Codeine Swelling   Duratuss [Phenylephrine -Guaifenesin] Hives   Erythromycin Swelling and Hives    ALLERGIC TO MYCINS  ALLERGIC TO MYCINS ALLERGIC TO MYCINS    ALLERGIC TO MYCINS   Hydrocodone-Acetaminophen  Swelling   Iodine Swelling   Latex Swelling   Oxycodone Swelling and Rash   Peanuts [Nuts] Swelling   Penicillins Swelling and Other (See Comments)   Shellfish Allergy Swelling   Vicodin [Hydrocodone-Acetaminophen ] Swelling   Grass Pollen(K-O-R-T-Swt Vern) Rash   Ace Inhibitors Swelling    Lip swelling   Methotrexate And Trimetrexate Other (See Comments)    Sick all over. Headache. Skin discoloration   Mushroom Other (See Comments)    Mouth/throat swelling   Phentermine Nausea Only     Headaches/tiredness/felt sick   Plaquenil [Hydroxychloroquine] Other (See Comments)    Skin discoloration (bluish-gray)/overly lethargic/constant headaches

## 2024-04-27 DIAGNOSIS — M4316 Spondylolisthesis, lumbar region: Secondary | ICD-10-CM | POA: Diagnosis not present

## 2024-04-27 LAB — CBC
HCT: 36.7 % (ref 36.0–46.0)
Hemoglobin: 12.1 g/dL (ref 12.0–15.0)
MCH: 26.8 pg (ref 26.0–34.0)
MCHC: 33 g/dL (ref 30.0–36.0)
MCV: 81.4 fL (ref 80.0–100.0)
Platelets: 211 K/uL (ref 150–400)
RBC: 4.51 MIL/uL (ref 3.87–5.11)
RDW: 13.2 % (ref 11.5–15.5)
WBC: 11.8 K/uL — ABNORMAL HIGH (ref 4.0–10.5)
nRBC: 0 % (ref 0.0–0.2)

## 2024-04-27 LAB — BASIC METABOLIC PANEL WITH GFR
Anion gap: 12 (ref 5–15)
BUN: 19 mg/dL (ref 8–23)
CO2: 23 mmol/L (ref 22–32)
Calcium: 9 mg/dL (ref 8.9–10.3)
Chloride: 101 mmol/L (ref 98–111)
Creatinine, Ser: 0.9 mg/dL (ref 0.44–1.00)
GFR, Estimated: >60 mL/min
Glucose, Bld: 122 mg/dL — ABNORMAL HIGH (ref 70–99)
Potassium: 4 mmol/L (ref 3.5–5.1)
Sodium: 136 mmol/L (ref 135–145)

## 2024-04-27 LAB — GLUCOSE, CAPILLARY: Glucose-Capillary: 130 mg/dL — ABNORMAL HIGH (ref 70–99)

## 2024-04-27 MED ORDER — DOCUSATE SODIUM 100 MG PO CAPS: 100.0000 mg | ORAL_CAPSULE | Freq: Two times a day (BID) | ORAL | 0 refills | Status: AC

## 2024-04-27 MED ORDER — HYDROMORPHONE HCL 2 MG PO TABS: 2.0000 mg | ORAL_TABLET | ORAL | 0 refills | Status: AC | PRN

## 2024-04-27 MED ORDER — TIZANIDINE HCL 2 MG PO TABS: 2.0000 mg | ORAL_TABLET | Freq: Three times a day (TID) | ORAL | 1 refills | Status: AC | PRN

## 2024-04-27 NOTE — Evaluation (Signed)
 Physical Therapy Evaluation & Discharge Patient Details Name: [name] MRN: [id number] DOB: Jun 01, 1955 Today's Date: 04/27/2024  History of Present Illness  Pt is a 69 y.o. female who presented 04/26/24 for elective L4-5 decompression, instrumentation and fusion. PMH: anemia, asthma, DM, GERD, gout, heart murmur, HSV-2 infection, HTN, OA, Sjogren's syndrome, thyroid  condition  Clinical Impression  Pt presents with condition above and deficits mentioned below, see PT Problem List. PTA, she was mod I intermittently using a SPC for functional mobility, living alone in a 1-level house with 3 STE. Prior to her surgery, pt was reporting numbness/tingling down her entire posterior legs, but now reports her sensation is intact except at her bil dorsal feet. She is reporting improved pain levels since surgery as well. She is currently mobilizing without LOB or assistance, but does display improved stability and pain management when using a RW compared to x1 UE support or no UE support when ambulating. Thus, encouraged pt to use a RW initially upon d/c. Educated pt on car transfers, brace donning and when to wear it, ambulating laps in home to tolerance >/= 3x/day, repositioning herself frequently, and sitting upright <45 min at a time. She verbalized understanding. As pt seems to be near if not better than her baseline and she will likely improve quickly as her pain improves, do not anticipate a need for post acute PT at this time. All education completed and questions answered. PT will sign off.      If plan is discharge home, recommend the following: Assistance with cooking/housework;Assist for transportation   Can travel by private vehicle        Equipment Recommendations None recommended by PT  Recommendations for Other Services       Functional Status Assessment Patient has had a recent decline in their functional status and demonstrates the ability to make significant improvements in function  in a reasonable and predictable amount of time.     Precautions / Restrictions Precautions Precautions: Fall;Back Precaution Booklet Issued: Yes (comment) Recall of Precautions/Restrictions: Intact Precaution/Restrictions Comments: hemovac Required Braces or Orthoses: Spinal Brace Spinal Brace: Lumbar corset;Applied in sitting position Restrictions Weight Bearing Restrictions Per Provider Order: No      Mobility  Bed Mobility Overal bed mobility: Needs Assistance Bed Mobility: Rolling, Sidelying to Sit, Sit to Supine Rolling: Contact guard assist Sidelying to sit: Contact guard assist, HOB elevated   Sit to supine: Contact guard assist, HOB elevated   General bed mobility comments: HOB elevated slightly, no use of rails. Educated pt to log roll then transition sidelying <> sit L EOB. Pt opted to pivot on buttocks to transition sit to supine instead while still maintaining her spinal precautions. Extra time to ascend trunk, but only CGA for safety    Transfers Overall transfer level: Needs assistance Equipment used: Rolling walker (2 wheels), None Transfers: Sit to/from Stand Sit to Stand: Supervision           General transfer comment: Pt able to stand from EOB to RW 1x and from commode to no AD 1x without LOB, supervision for safety    Ambulation/Gait Ambulation/Gait assistance: Supervision, Contact guard assist Gait Distance (Feet): 320 Feet (x2 bouts of ~320 ft > ~15 ft) Assistive device: Rolling walker (2 wheels), None (wall rail) Gait Pattern/deviations: Step-through pattern, Decreased stride length Gait velocity: reduced Gait velocity interpretation: 1.31 - 2.62 ft/sec, indicative of limited community ambulator   General Gait Details: Pt ambulated the majority of the distance with a RW but did  trial ambulating with R wall rail for ~10 ft and then no AD for ~5 ft. No LOB, CGA-supervision for safety. She did appear more stable and in less pain when using the RW.  Encouraged pt to use her RW at home at this time for those reasons and for a more symmetrical gait pattern as opposed to using a cane that could result in excess lateral trunk flexion.  Stairs Stairs: Yes Stairs assistance: Contact guard assist Stair Management: One rail Right, One rail Left, Step to pattern, Forwards Number of Stairs: 3 General stair comments: Ascended with R rail and descended with L rail, step-to pattern leading up with L foot. No LOB, extra effort noted though, CGA for safety  Wheelchair Mobility     Tilt Bed    Modified Rankin (Stroke Patients Only)       Balance Overall balance assessment: Mild deficits observed, not formally tested                                           Pertinent Vitals/Pain Pain Assessment Pain Assessment: Faces Faces Pain Scale: Hurts little more Pain Location: back Pain Descriptors / Indicators: Discomfort, Grimacing, Guarding, Operative site guarding Pain Intervention(s): Limited activity within patient's tolerance, Monitored during session, Repositioned, Ice applied    Home Living Family/patient expects to be discharged to:: Private residence Living Arrangements: Alone Available Help at Discharge: Family;Available 24 hours/day (daughters and mother can assist 24/7 at least initially) Type of Home: House Home Access: Stairs to enter Entrance Stairs-Rails: Can reach both Entrance Stairs-Number of Steps: 3   Home Layout: One level Home Equipment: Agricultural Consultant (2 wheels);Cane - quad;Cane - single point;Shower seat;BSC/3in1;Other (comment) (adjustable bed)      Prior Function Prior Level of Function : Independent/Modified Independent;Driving             Mobility Comments: Mod I using SPC intermittently, otherwise no AD       Extremity/Trunk Assessment   Upper Extremity Assessment Upper Extremity Assessment: Overall WFL for tasks assessed    Lower Extremity Assessment Lower Extremity  Assessment: RLE deficits/detail;LLE deficits/detail RLE Deficits / Details: reports numbness/tingling down bil posterior legs prior to surgery and now sensation is intact except at dorsal feet bil; MMT scores of 5/5 in quads and anterior tibialis RLE Sensation: decreased light touch LLE Deficits / Details: reports numbness/tingling down bil posterior legs prior to surgery and now sensation is intact except at dorsal feet bil; MMT scores of 5/5 in quads and anterior tibialis LLE Sensation: decreased light touch    Cervical / Trunk Assessment Cervical / Trunk Assessment: Back Surgery  Communication   Communication Communication: No apparent difficulties    Cognition Arousal: Alert Behavior During Therapy: WFL for tasks assessed/performed   PT - Cognitive impairments: No apparent impairments                         Following commands: Intact       Cueing Cueing Techniques: Verbal cues     General Comments General comments (skin integrity, edema, etc.): Educated pt on car transfers, brace donning and when to wear it, ambulating laps in home to tolerance >/= 3x/day, repositioning herself frequently, and sitting upright <45 min at a time.    Exercises     Assessment/Plan    PT Assessment Patient does not need any further PT services  PT Problem List Decreased activity tolerance;Decreased balance;Decreased mobility;Impaired sensation;Pain       PT Treatment Interventions DME instruction;Gait training;Stair training;Functional mobility training;Therapeutic activities;Therapeutic exercise;Balance training;Neuromuscular re-education;Patient/family education    PT Goals (Current goals can be found in the Care Plan section)  Acute Rehab PT Goals Patient Stated Goal: to continue to improve PT Goal Formulation: All assessment and education complete, DC therapy Time For Goal Achievement: 04/28/24 Potential to Achieve Goals: Good    Frequency Min 5X/week      Co-evaluation               AM-PAC PT 6 Clicks Mobility  Outcome Measure Help needed turning from your back to your side while in a flat bed without using bedrails?: A Little Help needed moving from lying on your back to sitting on the side of a flat bed without using bedrails?: A Little Help needed moving to and from a bed to a chair (including a wheelchair)?: A Little Help needed standing up from a chair using your arms (e.g., wheelchair or bedside chair)?: A Little Help needed to walk in hospital room?: A Little Help needed climbing 3-5 steps with a railing? : A Little 6 Click Score: 18    End of Session Equipment Utilized During Treatment: Back brace Activity Tolerance: Patient tolerated treatment well Patient left: in bed;with call bell/phone within reach Nurse Communication: Mobility status PT Visit Diagnosis: Unsteadiness on feet (R26.81);Other abnormalities of gait and mobility (R26.89);Pain Pain - Right/Left:  (back) Pain - part of body:  (back)    Time: 9178-9153 PT Time Calculation (min) (ACUTE ONLY): 25 min   Charges:   PT Evaluation $PT Eval Low Complexity: 1 Low PT Treatments $Therapeutic Activity: 8-22 mins PT General Charges $$ ACUTE PT VISIT: 1 Visit         Theo Ferretti, PT, DPT Acute Rehabilitation Services  Office: [phone]   Theo CHRISTELLA Ferretti 04/27/2024, 10:05 AM

## 2024-04-27 NOTE — Progress Notes (Signed)
 Patient alert and oriented, voided, ambulated.Hemovac removed per order. Surgical site clean and dry no sign of infection. D/c instructions explain and given to the patient, all questions answered.

## 2024-04-27 NOTE — Discharge Summary (Signed)
 Physician Discharge Summary  Patient ID: [name] MRN: [id number] DOB/AGE: [birth year] 70 y.o.  Admit date: 04/26/2024 Discharge date: 04/27/2024  Admission Diagnoses: Lumbar spondylolisthesis, lumbar spinal stenosis, lumbar facet arthropathy, lumbar radiculopathy, neurogenic claudication  Discharge Diagnoses: The same Principal Problem:   Spondylolisthesis of lumbar region   Discharged Condition: good  Hospital Course: I performed an L4-5 decompression, instrumentation and fusion on the patient on 04/26/2024.  The surgery went well.  The patient's postoperative course was unremarkable.  On postoperative day #1 the patient was doing well and requested discharge home.  She was given verbal and written discharge instructions.  All her questions were answered.  Consults: PT, care management Significant Diagnostic Studies: None Treatments: L4-5 decompression, instrumentation and fusion. Discharge Exam: Blood pressure 110/60, pulse 65, temperature 98.2 F (36.8 C), temperature source Oral, resp. rate 19, height 5' 6 (1.676 m), weight 101.6 kg, SpO2 97%. The patient is alert and pleasant.  She looks well.  Her strength is normal.  Disposition: Home  Discharge Instructions     Call MD for:  difficulty breathing, headache or visual disturbances   Complete by: As directed    Call MD for:  extreme fatigue   Complete by: As directed    Call MD for:  hives   Complete by: As directed    Call MD for:  persistant dizziness or light-headedness   Complete by: As directed    Call MD for:  persistant nausea and vomiting   Complete by: As directed    Call MD for:  redness, tenderness, or signs of infection (pain, swelling, redness, odor or green/yellow discharge around incision site)   Complete by: As directed    Call MD for:  severe uncontrolled pain   Complete by: As directed    Call MD for:  temperature >100.4   Complete by: As directed    Discharge instructions   Complete by: As  directed    Call [phone] for a followup appointment. Take a stool softener while you are using pain medications.   Driving Restrictions   Complete by: As directed    Do not drive for 2 weeks.   Increase activity slowly   Complete by: As directed    Lifting restrictions   Complete by: As directed    Do not lift more than 5 pounds. No excessive bending or twisting.   May shower / Bathe   Complete by: As directed    Remove the dressing for 3 days after surgery.  You may shower, but leave the incision alone.   Remove dressing in 48 hours   Complete by: As directed       Allergies as of 04/27/2024       Reactions   Augmentin [amoxicillin-pot Clavulanate] Shortness Of Breath, Rash   Bee Venom Shortness Of Breath, Swelling   Codeine Swelling   Duratuss [phenylephrine -guaifenesin] Hives   Erythromycin Swelling, Hives   ALLERGIC TO MYCINS ALLERGIC TO MYCINS ALLERGIC TO MYCINS    ALLERGIC TO MYCINS   Hydrocodone-acetaminophen  Swelling   Iodine Swelling   Latex Swelling   Oxycodone Swelling, Rash   Peanuts [nuts] Swelling   Penicillins Swelling, Other (See Comments)   Shellfish Allergy Swelling   Vicodin [hydrocodone-acetaminophen ] Swelling   Grass Pollen(k-o-r-t-swt Vern) Rash   Ace Inhibitors Swelling   Lip swelling   Methotrexate And Trimetrexate Other (See Comments)   Sick all over. Headache. Skin discoloration   Mushroom Other (See Comments)   Mouth/throat swelling   Phentermine Nausea  Only   Headaches/tiredness/felt sick   Plaquenil [hydroxychloroquine] Other (See Comments)   Skin discoloration (bluish-gray)/overly lethargic/constant headaches        Medication List     STOP taking these medications    acetaminophen  650 MG CR tablet Commonly known as: TYLENOL    meloxicam  15 MG tablet Commonly known as: MOBIC    traMADol  50 MG tablet Commonly known as: ULTRAM        TAKE these medications    Accu-Chek Guide Test test strip Generic drug:  glucose blood Use to check blood sugar 3 times daily.   Accu-Chek Guide w/Device Kit Use to check blood sugar 3 times daily.   Accu-Chek Softclix Lancets lancets Use to check blood sugar 3 times daily.   albuterol  108 (90 Base) MCG/ACT inhaler Commonly known as: VENTOLIN  HFA Inhale 2 puffs into the lungs every 6 (six) hours as needed for wheezing or shortness of breath.   ascorbic acid 500 MG tablet Commonly known as: VITAMIN C Take 500 mg by mouth daily as needed (immune support).   atorvastatin  10 MG tablet Commonly known as: LIPITOR 1 tab PO Q Mon/Wed/Frid   b complex vitamins capsule Take 1 capsule by mouth in the morning.   benzonatate  200 MG capsule Commonly known as: TESSALON  Take 1 capsule by mouth three times daily as needed for cough   cetirizine 10 MG tablet Commonly known as: ZYRTEC Take 10 mg by mouth at bedtime.   CINNAMON PO Take 2 tablets by mouth daily.   CORTIZONE-10 EX Apply 1 Application topically as needed (eczema).   dexlansoprazole  60 MG capsule Commonly known as: DEXILANT  Take 1 capsule (60 mg total) by mouth daily.   diclofenac Sodium 1 % Gel Commonly known as: VOLTAREN Apply 2 g topically 4 (four) times daily as needed (arthritis pain.).   docusate sodium  100 MG capsule Commonly known as: COLACE Take 1 capsule (100 mg total) by mouth 2 (two) times daily.   EPINEPHrine  0.3 mg/0.3 mL Soaj injection Commonly known as: EPI-PEN Inject 0.3 mg into the muscle as needed for anaphylaxis.   fluconazole  150 MG tablet Commonly known as: Diflucan  Take 1 tablet (150 mg total) by mouth daily. What changed:  when to take this reasons to take this   furosemide  20 MG tablet Commonly known as: LASIX  Take 1 tablet (20 mg total) by mouth daily as needed.   glucose 4 GM chewable tablet Chew 4 tablets by mouth daily as needed for low blood sugar.   hydrochlorothiazide  25 MG tablet Commonly known as: HYDRODIURIL  Take 1 tablet (25 mg total) by  mouth 2 (two) times daily.   HYDROmorphone  2 MG tablet Commonly known as: DILAUDID  Take 1-2 tablets (2-4 mg total) by mouth every 4 (four) hours as needed for moderate pain (pain score 4-6).   ipratropium-albuterol  0.5-2.5 (3) MG/3ML Soln Commonly known as: DUONEB USE 1 AMPULE IN NEBULIZER EVERY 6 HOURS AS NEEDED   iVIZIA Dry Eyes 0.5 % Soln Generic drug: Povidone (PF) Place 1 drop into both eyes in the morning and at bedtime.   leflunomide  20 MG tablet Commonly known as: ARAVA  Take 20 mg by mouth in the morning.   meclizine  25 MG tablet Commonly known as: ANTIVERT  Take 1 tablet (25 mg total) by mouth 2 (two) times daily as needed for dizziness.   montelukast  10 MG tablet Commonly known as: SINGULAIR  Take 1 tablet (10 mg total) by mouth at bedtime.   nebivolol  5 MG tablet Commonly known as: BYSTOLIC  Take 1 tablet (  5 mg total) by mouth every morning.   pilocarpine 5 MG tablet Commonly known as: SALAGEN Take 5 mg by mouth 3 (three) times daily as needed (dry mouth).   polyethylene glycol 17 g packet Commonly known as: MIRALAX / GLYCOLAX Take 17 g by mouth daily as needed (severe constipation.).   TheraBreath Dry Mouth Lozg Use as directed 1 lozenge in the mouth or throat as needed (dry mouth).   Salese/Xylitol Lozg Use as directed 1 lozenge in the mouth or throat as needed (dry mouth). ICE BREAKERS DUO for dry mouth   tiZANidine  2 MG tablet Commonly known as: ZANAFLEX  Take 1 tablet (2 mg total) by mouth every 8 (eight) hours as needed for muscle spasms.   triamcinolone  55 MCG/ACT nasal inhaler Commonly known as: NASACORT Place 2 sprays into both nostrils in the morning.   triamcinolone  cream 0.1 % Commonly known as: KENALOG Apply 1 Application topically 2 (two) times daily as needed (eczema).   valACYclovir  500 MG tablet Commonly known as: Valtrex  Take 1 tablet (500 mg total) by mouth daily.   Vitamin D  (Ergocalciferol ) 1.25 MG (50000 UNIT) Caps  capsule Commonly known as: DRISDOL  Take 1 capsule by mouth once a week What changed:  when to take this additional instructions   vitamin E 180 MG (400 UNITS) capsule Take 400 Units by mouth in the morning and at bedtime.         Signed: Reyes JONETTA Budge 04/27/2024, 7:46 AM

## 2024-04-27 NOTE — Care Management Obs Status (Signed)
 MEDICARE OBSERVATION STATUS NOTIFICATION   Patient Details  Name: [name] MRN: [id number] Date of Birth: 1955-11-05   Medicare Observation Status Notification Given:  Yes    [name] 04/27/2024, 10:04 AM

## 2024-05-02 MED FILL — Sodium Chloride IV Soln 0.9%: INTRAVENOUS | Qty: 2000 | Status: AC

## 2024-05-02 MED FILL — Heparin Sodium (Porcine) Inj 1000 Unit/ML: INTRAMUSCULAR | Qty: 30 | Status: AC

## 2024-05-03 ENCOUNTER — Other Ambulatory Visit: Payer: Self-pay | Admitting: Internal Medicine

## 2024-05-03 DIAGNOSIS — J4520 Mild intermittent asthma, uncomplicated: Secondary | ICD-10-CM

## 2024-05-05 ENCOUNTER — Other Ambulatory Visit: Payer: Self-pay | Admitting: Internal Medicine

## 2024-05-05 DIAGNOSIS — Z8619 Personal history of other infectious and parasitic diseases: Secondary | ICD-10-CM

## 2024-05-05 DIAGNOSIS — J4520 Mild intermittent asthma, uncomplicated: Secondary | ICD-10-CM

## 2024-05-29 ENCOUNTER — Ambulatory Visit: Admitting: General Practice

## 2024-06-05 ENCOUNTER — Ambulatory Visit: Admitting: Internal Medicine

## 2024-11-27 ENCOUNTER — Ambulatory Visit
